# Patient Record
Sex: Male | Born: 1965
Health system: Southern US, Community
[De-identification: ages and names within clinical notes are randomized; demographics above are authoritative.]

## PROBLEM LIST (undated history)

## (undated) ENCOUNTER — Emergency Department (HOSPITAL_COMMUNITY): Admission: EM | Payer: BLUE CROSS/BLUE SHIELD | Source: Home / Self Care

## (undated) DIAGNOSIS — Z720 Tobacco use: Secondary | ICD-10-CM

## (undated) DIAGNOSIS — I10 Essential (primary) hypertension: Secondary | ICD-10-CM

## (undated) DIAGNOSIS — E785 Hyperlipidemia, unspecified: Secondary | ICD-10-CM

## (undated) DIAGNOSIS — B20 Human immunodeficiency virus [HIV] disease: Secondary | ICD-10-CM

## (undated) DIAGNOSIS — F1910 Other psychoactive substance abuse, uncomplicated: Secondary | ICD-10-CM

## (undated) DIAGNOSIS — M109 Gout, unspecified: Secondary | ICD-10-CM

## (undated) DIAGNOSIS — Z21 Asymptomatic human immunodeficiency virus [HIV] infection status: Secondary | ICD-10-CM

## (undated) HISTORY — DX: Hyperlipidemia, unspecified: E78.5

## (undated) HISTORY — PX: WRIST FRACTURE SURGERY: SHX121

## (undated) HISTORY — DX: Asymptomatic human immunodeficiency virus (hiv) infection status: Z21

## (undated) HISTORY — DX: Human immunodeficiency virus (HIV) disease: B20

## (undated) HISTORY — DX: Other psychoactive substance abuse, uncomplicated: F19.10

---

## 2002-04-19 ENCOUNTER — Emergency Department (HOSPITAL_COMMUNITY): Admission: EM | Admit: 2002-04-19 | Discharge: 2002-04-19 | Payer: Self-pay | Admitting: Emergency Medicine

## 2002-04-19 ENCOUNTER — Encounter: Payer: Self-pay | Admitting: Emergency Medicine

## 2004-11-27 ENCOUNTER — Emergency Department (HOSPITAL_COMMUNITY): Admission: EM | Admit: 2004-11-27 | Discharge: 2004-11-27 | Payer: Self-pay | Admitting: Emergency Medicine

## 2009-07-19 ENCOUNTER — Emergency Department (HOSPITAL_BASED_OUTPATIENT_CLINIC_OR_DEPARTMENT_OTHER): Admission: EM | Admit: 2009-07-19 | Discharge: 2009-07-19 | Payer: Self-pay | Admitting: Emergency Medicine

## 2011-09-04 ENCOUNTER — Emergency Department (HOSPITAL_COMMUNITY): Payer: Self-pay

## 2011-09-04 ENCOUNTER — Emergency Department (HOSPITAL_COMMUNITY)
Admission: EM | Admit: 2011-09-04 | Discharge: 2011-09-04 | Disposition: A | Discharge status: Home / Self Care | Payer: Self-pay | Attending: Emergency Medicine | Admitting: Emergency Medicine

## 2011-09-04 DIAGNOSIS — R05 Cough: Secondary | ICD-10-CM | POA: Insufficient documentation

## 2011-09-04 DIAGNOSIS — J4489 Other specified chronic obstructive pulmonary disease: Secondary | ICD-10-CM | POA: Insufficient documentation

## 2011-09-04 DIAGNOSIS — J449 Chronic obstructive pulmonary disease, unspecified: Secondary | ICD-10-CM

## 2011-09-04 DIAGNOSIS — R059 Cough, unspecified: Secondary | ICD-10-CM | POA: Insufficient documentation

## 2011-09-04 DIAGNOSIS — F17200 Nicotine dependence, unspecified, uncomplicated: Secondary | ICD-10-CM | POA: Insufficient documentation

## 2011-09-04 MED ORDER — ALBUTEROL SULFATE HFA 108 (90 BASE) MCG/ACT IN AERS
2.0000 | INHALATION_SPRAY | Freq: Once | RESPIRATORY_TRACT | Status: AC
  Administered 2011-09-04: 2 via RESPIRATORY_TRACT
  Filled 2011-09-04: qty 6.7

## 2011-09-04 NOTE — ED Notes (Signed)
Pt. Developed a cough 3 months ago. Went to [facility] and was treated with amoxicillin and chest X-ray (normal), but pt. Continue to have cough, non-productive, denies any chills , fever pain

## 2011-09-04 NOTE — ED Provider Notes (Signed)
History     CSN: [id number] Arrival date & time: 09/04/2011 10:49 AM   First MD Initiated Contact with Patient 09/04/11 1117      Chief Complaint  Patient presents with  . Cough    cough for 3 months    (Consider location/radiation/quality/duration/timing/severity/associated sxs/prior treatment) HPI Patient presents with cough which has been going on approximately 3 months. Cough is nonproductive. He states it is worse at nighttime and is causing him to lose sleep. He has no fevers or chills. No body aches or systemic symptoms. He was seen at [facility] approximately one month ago and had a chest x-ray which she states was normal. He was also given an albuterol treatment in the ED and started on amoxicillin for a two-week course. He states he had no improvement after these treatments. He does smoke cigarettes daily. He has not tried any other treatment for his symptoms. Nothing makes the symptoms better or worse. And there no other associated systemic symptoms. History reviewed. No pertinent past medical history.  Past Surgical History  Procedure Date  . Wrist surgery     History reviewed. No pertinent family history.  History  Substance Use Topics  . Smoking status: Current Everyday Smoker -- 0.5 packs/day    Types: Cigarettes  . Smokeless tobacco: Never Used  . Alcohol Use: 2.4 oz/week    4 Cans of beer per week      Review of Systems ROS reviewed and otherwise negative except for mentioned in HPI  Allergies  Review of patient's allergies indicates no known allergies.  Home Medications  No current outpatient prescriptions on file.  BP 170/72  Pulse 77  Temp(Src) 98.7 F (37.1 C) (Oral)  Resp 16  Ht 5\' 9"  (1.753 m)  Wt 180 lb (81.647 kg)  BMI 26.58 kg/m2  SpO2 98% Vitals reviewed Physical Exam Physical Examination: General appearance - alert, well appearing, and in no distress Mental status - alert, oriented to person, place, and time Mouth -  mucous membranes moist, pharynx normal without lesions Neck - supple, no significant adenopathy Chest - clear to auscultation, no wheezes, rales or rhonchi, symmetric air entry Heart - normal rate, regular rhythm, normal S1, S2, no murmurs, rubs, clicks or gallops Abdomen - soft, nontender, nondistended, no masses or organomegaly Musculoskeletal - no joint tenderness, deformity or swelling Extremities - peripheral pulses normal, no pedal edema, no clubbing or cyanosis Skin - normal coloration and turgor, no rashes  ED Course  Procedures (including critical care time)  Labs Reviewed - No data to display Dg Chest 2 View  09/04/2011  *RADIOLOGY REPORT*  Clinical Data: Cough.  CHEST - 2 VIEW  Comparison: None.  Findings: Trachea is midline.  Heart is at the upper limits of normal in size.  Lungs are mildly hyperexpanded but clear.  No pleural fluid.  IMPRESSION: COPD without acute finding.  Original Report Authenticated By: Reyes Ivan, M.D.     1. Cough   2. COPD (chronic obstructive pulmonary disease)       MDM  Patient presenting with chronic cough for several months. Chest x-ray shows signs of COPD. Patient is a daily smoker and it was recommended that he quit smoking. He is given albuterol inhaler and instructed on its use. He has no wheezing or dyspnea on exam to warrant steroid administration. He was discharged with strict return precautions and is agreeable with this plan.        Ethelda Chick, MD 09/04/11 [phone]

## 2012-07-29 ENCOUNTER — Observation Stay (HOSPITAL_COMMUNITY)
Admission: EM | Admit: 2012-07-29 | Discharge: 2012-07-30 | Disposition: A | Discharge status: Home / Self Care | Payer: 59 | Attending: Cardiovascular Disease | Admitting: Cardiovascular Disease

## 2012-07-29 ENCOUNTER — Encounter (HOSPITAL_COMMUNITY): Payer: Self-pay | Admitting: Physical Medicine and Rehabilitation

## 2012-07-29 ENCOUNTER — Emergency Department (HOSPITAL_COMMUNITY): Payer: Self-pay

## 2012-07-29 DIAGNOSIS — F17200 Nicotine dependence, unspecified, uncomplicated: Secondary | ICD-10-CM | POA: Insufficient documentation

## 2012-07-29 DIAGNOSIS — J4489 Other specified chronic obstructive pulmonary disease: Secondary | ICD-10-CM | POA: Insufficient documentation

## 2012-07-29 DIAGNOSIS — R0789 Other chest pain: Principal | ICD-10-CM

## 2012-07-29 DIAGNOSIS — I10 Essential (primary) hypertension: Secondary | ICD-10-CM

## 2012-07-29 DIAGNOSIS — Z720 Tobacco use: Secondary | ICD-10-CM

## 2012-07-29 DIAGNOSIS — R0602 Shortness of breath: Secondary | ICD-10-CM | POA: Insufficient documentation

## 2012-07-29 DIAGNOSIS — R079 Chest pain, unspecified: Secondary | ICD-10-CM

## 2012-07-29 DIAGNOSIS — J449 Chronic obstructive pulmonary disease, unspecified: Secondary | ICD-10-CM | POA: Insufficient documentation

## 2012-07-29 HISTORY — DX: Tobacco use: Z72.0

## 2012-07-29 HISTORY — DX: Essential (primary) hypertension: I10

## 2012-07-29 LAB — CBC WITH DIFFERENTIAL/PLATELET
Basophils Absolute: 0 10*3/uL (ref 0.0–0.1)
Basophils Relative: 1 % (ref 0–1)
Hemoglobin: 14.7 g/dL (ref 13.0–17.0)
MCHC: 35 g/dL (ref 30.0–36.0)
Neutro Abs: 2.1 10*3/uL (ref 1.7–7.7)
Neutrophils Relative %: 45 % (ref 43–77)
RDW: 14.4 % (ref 11.5–15.5)
WBC: 4.6 10*3/uL (ref 4.0–10.5)

## 2012-07-29 LAB — BASIC METABOLIC PANEL
Chloride: 103 mEq/L (ref 96–112)
GFR calc Af Amer: >90 mL/min (ref >90)
Potassium: 4.1 mEq/L (ref 3.5–5.1)

## 2012-07-29 LAB — CK TOTAL AND CKMB (NOT AT ARMC)
CK, MB: 4.3 ng/mL — ABNORMAL HIGH (ref 0.3–4.0)
Relative Index: 2 (ref 0.0–2.5)

## 2012-07-29 LAB — POCT I-STAT TROPONIN I: Troponin i, poc: 0.03 ng/mL (ref 0.00–0.08)

## 2012-07-29 MED ORDER — NITROGLYCERIN IN D5W 200-5 MCG/ML-% IV SOLN
5.0000 ug/min | INTRAVENOUS | Status: DC
  Filled 2012-07-29: qty 250

## 2012-07-29 MED ORDER — HEPARIN BOLUS VIA INFUSION: 4000.0000 [IU] | Freq: Once | INTRAVENOUS | Status: DC

## 2012-07-29 MED ORDER — ONDANSETRON HCL 4 MG/2ML IJ SOLN: 4.0000 mg | Freq: Four times a day (QID) | INTRAMUSCULAR | Status: DC | PRN

## 2012-07-29 MED ORDER — SODIUM CHLORIDE 0.9 % IJ SOLN
3.0000 mL | Freq: Two times a day (BID) | INTRAMUSCULAR | Status: DC
  Administered 2012-07-29: 3 mL via INTRAVENOUS

## 2012-07-29 MED ORDER — POTASSIUM CHLORIDE CRYS ER 10 MEQ PO TBCR
10.0000 meq | EXTENDED_RELEASE_TABLET | Freq: Every day | ORAL | Status: DC
  Administered 2012-07-30: 10 meq via ORAL
  Filled 2012-07-29: qty 1

## 2012-07-29 MED ORDER — NITROGLYCERIN 0.4 MG SL SUBL: 0.4000 mg | SUBLINGUAL_TABLET | SUBLINGUAL | Status: DC | PRN

## 2012-07-29 MED ORDER — AMLODIPINE BESYLATE 2.5 MG PO TABS
2.5000 mg | ORAL_TABLET | Freq: Every day | ORAL | Status: DC
  Filled 2012-07-29: qty 1

## 2012-07-29 MED ORDER — HYDROCHLOROTHIAZIDE 25 MG PO TABS
25.0000 mg | ORAL_TABLET | Freq: Every day | ORAL | Status: DC
  Administered 2012-07-30: 25 mg via ORAL
  Filled 2012-07-29: qty 1

## 2012-07-29 MED ORDER — HEPARIN (PORCINE) IN NACL 100-0.45 UNIT/ML-% IJ SOLN: 1000.0000 [IU]/h | INTRAMUSCULAR | Status: DC

## 2012-07-29 MED ORDER — NITROGLYCERIN 0.4 MG SL SUBL
0.4000 mg | SUBLINGUAL_TABLET | Freq: Once | SUBLINGUAL | Status: AC
  Administered 2012-07-29: 0.4 mg via SUBLINGUAL

## 2012-07-29 MED ORDER — ACETAMINOPHEN 325 MG PO TABS: 650.0000 mg | ORAL_TABLET | ORAL | Status: DC | PRN

## 2012-07-29 MED ORDER — SODIUM CHLORIDE 0.9 % IV SOLN: 250.0000 mL | INTRAVENOUS | Status: DC | PRN

## 2012-07-29 MED ORDER — REGADENOSON 0.4 MG/5ML IV SOLN
0.4000 mg | Freq: Once | INTRAVENOUS | Status: AC
  Administered 2012-07-30: 0.4 mg via INTRAVENOUS
  Filled 2012-07-29: qty 5

## 2012-07-29 MED ORDER — ASPIRIN EC 81 MG PO TBEC
81.0000 mg | DELAYED_RELEASE_TABLET | Freq: Every day | ORAL | Status: DC
  Administered 2012-07-30: 81 mg via ORAL
  Filled 2012-07-29: qty 1

## 2012-07-29 MED ORDER — CARVEDILOL 6.25 MG PO TABS
6.2500 mg | ORAL_TABLET | Freq: Two times a day (BID) | ORAL | Status: DC
  Administered 2012-07-29: 6.25 mg via ORAL
  Filled 2012-07-29 (×4): qty 1

## 2012-07-29 MED ORDER — DEXTROSE 5 % IV SOLN
1.0000 g | Freq: Once | INTRAVENOUS | Status: AC
  Administered 2012-07-29: 1 g via INTRAVENOUS
  Filled 2012-07-29: qty 10

## 2012-07-29 MED ORDER — DEXTROSE 5 % IV SOLN
500.0000 mg | Freq: Once | INTRAVENOUS | Status: AC
  Administered 2012-07-29: 500 mg via INTRAVENOUS
  Filled 2012-07-29: qty 500

## 2012-07-29 MED ORDER — SODIUM CHLORIDE 0.9 % IJ SOLN: 3.0000 mL | INTRAMUSCULAR | Status: DC | PRN

## 2012-07-29 NOTE — ED Notes (Signed)
Pt presents to department for evaluation of dizziness, midsternal chest pain and hypertension. Onset Tuesday. Pt states 9/10 chest pain. Respirations unlabored at the time. Skin warm and dry. He is conscious alert and oriented x4.

## 2012-07-29 NOTE — H&P (Signed)
ADMISSION HISTORY AND PHYSICAL   Date: 07/29/2012               Patient Name:  Brandon Quinn MRN: [id number]  DOB: 1966/05/27 Age / Sex: 46 y.o., male        PCP: None Primary Cardiologist: New to [place]         History of Present Illness: Patient is a 46 y.o. male with a PMHx of HTN, tobacco abuse, and COPD who was admitted to [facility] on 07/29/2012 for evaluation of  Chest tightness for the past year.  It resolved with SL NTG in the ER.  We were asked to see and evaluate.   Has not had regular medical care for many years and does not take medications. Has had chest pain daily for 2 years. Was evaluated at [facility] last year for chest pain and was told he had chronic bronchitis (no EKG was done). Worse with movement of his torso. Walks a lot at work and has chest pain with walking occasionally. No associated sob, nausea, or diaphoresis. No orthopnea. Occasional night sweats. Central chest pain without radiation. Difficultly describing character.  He presented to the ER with these pains.  The pains resolved with 1 SL NTG.  He is now pain free.  He was told that he had HTN about 4 years ago. Took some pills for a month or so but never returned to the doctor and never took any further meds.  He avoids salt in his diet.  He is originally from [place] and has been in [place] for 13 years.  Medications: Outpatient medications:  (Not in a hospital admission)  No Known Allergies   Past Medical History  Diagnosis Date  . Hypertension   . Tobacco abuse   . COPD (chronic obstructive pulmonary disease)     Past Surgical History  Procedure Date  . Wrist surgery     Family History  Problem Relation Age of Onset  . Other      No known heart disease    Social History:  reports that he has been smoking Cigarettes.  He has been smoking about .5 packs per day. He has never used smokeless tobacco. He reports that he drinks about 2.4 ounces of alcohol per week. He reports that  he does not use illicit drugs.   Review of Systems: Constitutional:  denies fever, chills, diaphoresis, appetite change and fatigue.  HEENT: denies photophobia, eye pain, redness, hearing loss, ear pain, congestion, sore throat, rhinorrhea, sneezing, neck pain, neck stiffness and tinnitus.  Respiratory: denies SOB, DOE, and wheezing. (+) cough  Cardiovascular: As per HPI   Gastrointestinal: denies nausea, vomiting, abdominal pain, diarrhea, constipation, blood in stool.  Genitourinary: denies dysuria, urgency, frequency, hematuria, flank pain and difficulty urinating.  Musculoskeletal: denies  myalgias, back pain, joint swelling, arthralgias and gait problem.   Skin: denies pallor, rash and wound.  Neurological: denies dizziness, seizures, syncope, weakness, light-headedness, numbness and headaches.   Hematological: denies adenopathy, easy bruising, personal or family bleeding history.  Psychiatric/ Behavioral: denies suicidal ideation, mood changes, confusion, nervousness, sleep disturbance and agitation.    Physical Exam: BP 176/102  Pulse 86  Temp 97.7 F (36.5 C) (Oral)  Resp 16  Ht 5\' 9"  (1.753 m)  Wt 180 lb (81.647 kg)  BMI 26.58 kg/m2  SpO2 100%  General: Vital signs reviewed and noted. Well-developed, well-nourished, in no acute distress; alert, appropriate and cooperative throughout examination.  Head: Normocephalic, atraumatic, sclera anicteric, mucus membranes  are moist  Neck: Supple. Negative for carotid bruits. JVD not elevated.  Lungs:  Clear bilaterally to auscultation without wheezes, rales, or rhonchi. Breathing is unlabored.  Heart: RR with S1 S2. Soft systolic murmur  Abdomen:  Soft, non-tender, non-distended with normoactive bowel sounds. No hepatomegaly. No rebound/guarding. No obvious abdominal masses  MSK: Strength and the appear normal for age.  Extremities: No clubbing or cyanosis. No edema.  Distal pedal pulses are 2+ and equal bilaterally.  Neurologic:  Alert and oriented X 3. Moves all extremities spontaneously  Psych:  Responds to questions appropriately with a normal affect.    Lab results: Basic Metabolic Panel:  Lab 07/29/12 4098  NA 136  K 4.1  CL 103  CO2 24  GLUCOSE 85  BUN 12  CREATININE 0.85  CALCIUM 8.9  MG --  PHOS --    Liver Function Tests: No results found for this basename: AST:3,ALT:3,ALKPHOS:3,BILITOT:3,PROT:3,ALBUMIN:3 in the last 168 hours No results found for this basename: LIPASE:3,AMYLASE:3 in the last 168 hours  CBC:  Lab 07/29/12 1835  WBC 4.6  NEUTROABS 2.1  HGB 14.7  HCT 42.0  MCV 84.5  PLT 175    Cardiac Enzymes: No results found for this basename: CKTOTAL:5,CKMB:5,CKMBINDEX:5,TROPONINI:5 in the last 168 hours  BNP: No components found with this basename: POCBNP:3  CBG: No results found for this basename: GLUCAP:5 in the last 168 hours  Coagulation Studies: No results found for this basename: LABPROT:3,INR:3 in the last 72 hours   ECG:  NSR at 84. LVH with repol changes. No old ECG Imaging: Dg Chest 2 View  07/29/2012  *RADIOLOGY REPORT*  Clinical Data: Mid chest pain and pain in the upper mid back.  CHEST - 2 VIEW  Comparison: 08/04/2011.  Findings: The heart, mediastinum and hilar contours are normal. There is a tiny patchy area of density in the right lower lung field the office of hilum.  The lungs are otherwise clear.  There are no effusions or pneumothoraces.  There are no acute bony changes.  IMPRESSION: No acute findings. There is a small, indistinct opacity at the right base.  This could be a small area of atelectasis or infiltrate. Clinical correlation and a short interval chest radiograph follow-up are recommended.   Original Report Authenticated By: Mervin Hack, M.D.     Assessment & Plan:  1. Chest tightness:  He presents to the ED with 2 years of CP.  He has been to the ED in [facility] and in [place].  The pain worsened today and he presented to our ER .   He was given a SL NTG and the pain resolved.  He is now pain free.  He has LVH with repol in ECG - no old tracings to compare.   Will admit to observation.  Cycle cardiac enzymes.  Check lipids in the am.  I anticipate doing a Lexiscan Myoview in the AM.  If his enzymes are elevated, will do cath instead.  He has a long hx of uncontrolled HTN, cigarette smoking.  No family hx of CAD that he knows.  2. Hypertension:  Will start him on HCTZ 25 QD, , kdur 1-0 QD,  and amlodipine 5 mg a day.  He may also benefit from coreg .  We will need to stick to the $4 list.  Will try to arrange for him to go the [employer].   DVT PPX -   Alvia Grove., MD, [facility] 07/29/2012, 7:57 PM

## 2012-07-29 NOTE — ED Provider Notes (Signed)
History     CSN: [id number]  Arrival date & time 07/29/12  1807   First MD Initiated Contact with Patient 07/29/12 1842      Chief Complaint  Patient presents with  . Chest Pain  . Shortness of Breath    (Consider location/radiation/quality/duration/timing/severity/associated sxs/prior treatment) Patient is a 46 y.o. male presenting with chest pain and shortness of breath. The history is provided by the patient.  Chest Pain Primary symptoms include shortness of breath and dizziness. Pertinent negatives for primary symptoms include no abdominal pain, no nausea and no vomiting.  Dizziness does not occur with nausea, vomiting or weakness.  Pertinent negatives for associated symptoms include no numbness and no weakness.    Shortness of Breath  Associated symptoms include chest pain and shortness of breath.   patient states he's had chest pain for the last couple days. He also states she's been having pain for the last 2 years. He states is more severe today and is worried about it. He states he's had a little cough without production. She's had a little dizziness. The pain comes does but has also been constant. He has hypertension and states he was on medications but is not currently on them. He does not have a good primary care followup Dr.  Past Medical History  Diagnosis Date  . Hypertension   . Tobacco abuse   . COPD (chronic obstructive pulmonary disease)     Past Surgical History  Procedure Date  . Wrist surgery     Family History  Problem Relation Age of Onset  . Other      No known heart disease    History  Substance Use Topics  . Smoking status: Current Every Day Smoker -- 0.5 packs/day    Types: Cigarettes  . Smokeless tobacco: Never Used  . Alcohol Use: 2.4 oz/week    4 Cans of beer per week      Review of Systems  Constitutional: Negative for activity change and appetite change.  HENT: Negative for neck stiffness.   Eyes: Negative for pain.    Respiratory: Positive for shortness of breath. Negative for chest tightness.   Cardiovascular: Positive for chest pain. Negative for leg swelling.  Gastrointestinal: Negative for nausea, vomiting, abdominal pain and diarrhea.  Genitourinary: Negative for flank pain.  Musculoskeletal: Negative for back pain.  Skin: Negative for rash.  Neurological: Positive for dizziness. Negative for weakness, numbness and headaches.  Psychiatric/Behavioral: Negative for behavioral problems.    Allergies  Review of patient's allergies indicates no known allergies.  Home Medications  No current outpatient prescriptions on file.  BP 153/93  Pulse 98  Temp 97.7 F (36.5 C) (Oral)  Resp 22  Ht 5\' 9"  (1.753 m)  Wt 180 lb (81.647 kg)  BMI 26.58 kg/m2  SpO2 97%  Physical Exam  Nursing note and vitals reviewed. Constitutional: He is oriented to person, place, and time. He appears well-developed and well-nourished.  HENT:  Head: Normocephalic and atraumatic.  Eyes: EOM are normal. Pupils are equal, round, and reactive to light.  Neck: Normal range of motion. Neck supple.  Cardiovascular: Normal rate, regular rhythm and normal heart sounds.   No murmur heard.      Hypertension  Pulmonary/Chest: Effort normal and breath sounds normal.  Abdominal: Soft. Bowel sounds are normal. He exhibits no distension and no mass. There is no tenderness. There is no rebound and no guarding.  Musculoskeletal: Normal range of motion. He exhibits no edema.  Neurological: He is  alert and oriented to person, place, and time. No cranial nerve deficit.  Skin: Skin is warm and dry.  Psychiatric: He has a normal mood and affect.    ED Course  Procedures (including critical care time)  Labs Reviewed  CBC WITH DIFFERENTIAL - Abnormal; Notable for the following:    Monocytes Relative 13 (*)     Eosinophils Relative 7 (*)     All other components within normal limits  BASIC METABOLIC PANEL  POCT I-STAT TROPONIN I    Dg Chest 2 View  07/29/2012  *RADIOLOGY REPORT*  Clinical Data: Mid chest pain and pain in the upper mid back.  CHEST - 2 VIEW  Comparison: 08/04/2011.  Findings: The heart, mediastinum and hilar contours are normal. There is a tiny patchy area of density in the right lower lung field the office of hilum.  The lungs are otherwise clear.  There are no effusions or pneumothoraces.  There are no acute bony changes.  IMPRESSION: No acute findings. There is a small, indistinct opacity at the right base.  This could be a small area of atelectasis or infiltrate. Clinical correlation and a short interval chest radiograph follow-up are recommended.   Original Report Authenticated By: Mervin Hack, M.D.      1. Chest pain   2. Chest tightness   3. Hypertension     Date: 07/29/2012  Rate: 84  Rhythm: normal sinus rhythm  QRS Axis: normal  Intervals: normal  ST/T Wave abnormalities: nonspecific ST/T changes  Conduction Disutrbances:LVH with repol abnormality  Narrative Interpretation: LVH with repull versus ST changes from ischemia  Old EKG Reviewed: none available     MDM  Patient with chest pain. His been going on for a couple years but worse today. Also has uncontrolled hypertension. Chest pain resolved with nitroglycerin the patient was pain-free after it. EKG shows likely repolarization from LVH. We do not have an old one to compare to. Patient be admitted to cardiology. Initial enzymes are negative.        Juliet Rude. Rubin Payor, MD 07/29/12 2055

## 2012-07-30 ENCOUNTER — Observation Stay (HOSPITAL_COMMUNITY): Payer: Self-pay

## 2012-07-30 ENCOUNTER — Encounter (HOSPITAL_COMMUNITY): Payer: Self-pay | Admitting: General Practice

## 2012-07-30 DIAGNOSIS — R079 Chest pain, unspecified: Secondary | ICD-10-CM

## 2012-07-30 DIAGNOSIS — Z720 Tobacco use: Secondary | ICD-10-CM

## 2012-07-30 LAB — BASIC METABOLIC PANEL
BUN: 12 mg/dL (ref 6–23)
CO2: 27 mEq/L (ref 19–32)
Calcium: 8.4 mg/dL (ref 8.4–10.5)
Glucose, Bld: 75 mg/dL (ref 70–99)
Sodium: 137 mEq/L (ref 135–145)

## 2012-07-30 LAB — LIPID PANEL
Cholesterol: 194 mg/dL (ref 0–200)
LDL Cholesterol: 128 mg/dL — ABNORMAL HIGH (ref 0–99)
Triglycerides: 86 mg/dL (ref <150)

## 2012-07-30 LAB — TROPONIN I: Troponin I: <0.3 ng/mL (ref <0.30)

## 2012-07-30 LAB — HEMOGLOBIN A1C
Hgb A1c MFr Bld: 5.7 % — ABNORMAL HIGH (ref <5.7)
Mean Plasma Glucose: 117 mg/dL — ABNORMAL HIGH (ref <117)

## 2012-07-30 LAB — CK TOTAL AND CKMB (NOT AT ARMC)
CK, MB: 3.3 ng/mL (ref 0.3–4.0)
Relative Index: 1.9 (ref 0.0–2.5)
Total CK: 173 U/L (ref 7–232)

## 2012-07-30 MED ORDER — PNEUMOCOCCAL VAC POLYVALENT 25 MCG/0.5ML IJ INJ
0.5000 mL | INJECTION | Freq: Once | INTRAMUSCULAR | Status: DC
  Filled 2012-07-30: qty 0.5

## 2012-07-30 MED ORDER — HYDROCHLOROTHIAZIDE 25 MG PO TABS: 25.0000 mg | ORAL_TABLET | Freq: Every day | ORAL | Status: DC

## 2012-07-30 MED ORDER — TECHNETIUM TC 99M SESTAMIBI GENERIC - CARDIOLITE
30.0000 | Freq: Once | INTRAVENOUS | Status: AC | PRN
  Administered 2012-07-30: 30 via INTRAVENOUS

## 2012-07-30 MED ORDER — INFLUENZA VIRUS VACC SPLIT PF IM SUSP
0.5000 mL | Freq: Once | INTRAMUSCULAR | Status: DC
Start: 2012-07-30 — End: 2012-07-30
  Filled 2012-07-30: qty 0.5

## 2012-07-30 MED ORDER — TECHNETIUM TC 99M SESTAMIBI GENERIC - CARDIOLITE
10.0000 | Freq: Once | INTRAVENOUS | Status: AC | PRN
  Administered 2012-07-30: 10 via INTRAVENOUS

## 2012-07-30 MED ORDER — AMLODIPINE BESYLATE 5 MG PO TABS: 5.0000 mg | ORAL_TABLET | Freq: Every day | ORAL | Status: DC

## 2012-07-30 MED ORDER — CARVEDILOL 6.25 MG PO TABS: 6.2500 mg | ORAL_TABLET | Freq: Two times a day (BID) | ORAL | Status: DC

## 2012-07-30 MED ORDER — REGADENOSON 0.4 MG/5ML IV SOLN
INTRAVENOUS | Status: AC
  Administered 2012-07-30: 0.4 mg via INTRAVENOUS
  Filled 2012-07-30: qty 5

## 2012-07-30 MED ORDER — POTASSIUM CHLORIDE CRYS ER 10 MEQ PO TBCR: 10.0000 meq | EXTENDED_RELEASE_TABLET | Freq: Every day | ORAL | Status: DC

## 2012-07-30 MED ORDER — AMLODIPINE BESYLATE 5 MG PO TABS
5.0000 mg | ORAL_TABLET | Freq: Every day | ORAL | Status: DC
  Administered 2012-07-30: 5 mg via ORAL
  Filled 2012-07-30: qty 1

## 2012-07-30 NOTE — Discharge Summary (Signed)
Patient ID: Brandon Quinn,  MRN: [id number], DOB/AGE: Feb 24, 1966 46 y.o.  Admit date: 07/29/2012 Discharge date: 07/30/2012  Primary Care Provider: None Primary Cardiologist: Katherina Right, MD  Discharge Diagnoses Principal Problem:  *Chest tightness  **s/p normal Lexiscan Sestamibi this admission. Active Problems:  Hypertension  Tobacco abuse  Tobacco Abuse  Allergies No Known Allergies  Procedures  07/30/2012 Lexiscan Sestamibi  No ischemia/infarct.  Normal EF.  History of Present Illness  46 y/o male with prior h/o untreated hypertension and tobacco abuse along with a long history of intermittent chest pain.  He presented to the [facility] ED secondary to recurrent chest pain while walking at work.  In the ED, symptoms resolved following 1 sublingual nitroglycerin.  ECG showed LVH with repolarization abnormalities.  He was placed in observation for further evaluation.  Hospital Course  Following admission, pt had no further chest pain.  His cardiac enzymes remained negative.  He underwent lexiscan sestamibi this AM revealing no evidence of infarct or ischemia.  He has been hypertensive throughout his admission and we have initiated antihypertensive therapy including bb, diuretic, and ccb.  We have arranged for office f/u next week, at which point we will also assess a basic metabolic profile.  We have encouraged him to quit smoking and also to obtain a primary care provider.  He will be discharged home today in good condition.  Discharge Vitals Blood pressure 158/76, pulse 63, temperature 98.2 F (36.8 C), temperature source Oral, resp. rate 20, height 5\' 9"  (1.753 m), weight 179 lb 10.8 oz (81.5 kg), SpO2 100.00%.  Filed Weights   07/29/12 1812 07/29/12 2128  Weight: 180 lb (81.647 kg) 179 lb 10.8 oz (81.5 kg)   Labs  CBC  Basename 07/29/12 1835  WBC 4.6  NEUTROABS 2.1  HGB 14.7  HCT 42.0  MCV 84.5  PLT 175   Basic Metabolic Panel  Basename 07/30/12 0259 07/29/12 1835    NA 137 136  K 3.7 4.1  CL 104 103  CO2 27 24  GLUCOSE 75 85  BUN 12 12  CREATININE 0.85 0.85  CALCIUM 8.4 8.9  MG -- --  PHOS -- --   Cardiac Enzymes  Basename 07/30/12 0259 07/29/12 2130  CKTOTAL 173 220  CKMB 3.3 4.3*  CKMBINDEX -- --  TROPONINI <0.30 <0.30   Hemoglobin A1C  Basename 07/29/12 2131  HGBA1C 5.7*   Fasting Lipid Panel  Basename 07/30/12 0259  CHOL 194  HDL 49  LDLCALC 128*  TRIG 86  CHOLHDL 4.0  LDLDIRECT --   Thyroid Function Tests  Basename 07/29/12 2131  TSH 0.715  T4TOTAL --  T3FREE --  THYROIDAB --   Disposition  Pt is being discharged home today in good condition.  Follow-up Plans & Appointments      Follow-up Information    Follow up with Norma Fredrickson, NP. On 08/06/2012. (9:15 AM - for visit with Dr. Harvie Bridge Nurse Practitioner and a blood chemistry)    Contact information:   [address] [place] [address] [phone]       Follow up with Obtain a primary care provider locally. In 2 weeks.       Discharge Medications    Medication List     As of 07/30/2012  3:23 PM    TAKE these medications         amLODipine 5 MG tablet   Commonly known as: NORVASC   Take 1 tablet (5 mg total) by mouth daily.  carvedilol 6.25 MG tablet   Commonly known as: COREG   Take 1 tablet (6.25 mg total) by mouth 2 (two) times daily with a meal.      hydrochlorothiazide 25 MG tablet   Commonly known as: HYDRODIURIL   Take 1 tablet (25 mg total) by mouth daily.      potassium chloride 10 MEQ tablet   Commonly known as: K-DUR,KLOR-CON   Take 1 tablet (10 mEq total) by mouth daily.       Outstanding Labs/Studies  *Bmet next week.  Duration of Discharge Encounter   Greater than 30 minutes including physician time.  Signed, Nicolasa Ducking NP 07/30/2012, 3:23 PM   Attending note: I have seen and examined patient and made appropriate changes to the above.  He has severe , uncontrolled HTN.  I  suspect this is the cause of his ECG changes and also possibly his CP.  Myoview was negative.  Home on the above meds.  We will see him in several weeks.  Vesta Mixer, Montez Hageman., MD, [facility]

## 2012-07-30 NOTE — Progress Notes (Signed)
Patient Name: Brandon Quinn Date of Encounter: 07/30/2012   Active Problems:  Chest tightness  Hypertension   SUBJECTIVE  No chest pain or sob overnight.  For mv today.  CURRENT MEDS    . amLODipine  2.5 mg Oral Daily  . aspirin EC  81 mg Oral Daily  . azithromycin  500 mg Intravenous Once  . carvedilol  6.25 mg Oral BID WC  . cefTRIAXone (ROCEPHIN)  IV  1 g Intravenous Once  . hydrochlorothiazide  25 mg Oral Daily  . influenza  inactive virus vaccine  0.5 mL Intramuscular Once  . nitroGLYCERIN  0.4 mg Sublingual Once  . pneumococcal 23 valent vaccine  0.5 mL Intramuscular Once  . potassium chloride  10 mEq Oral Daily  . regadenoson  0.4 mg Intravenous Once  . sodium chloride  3 mL Intravenous Q12H  . DISCONTD: heparin  4,000 Units Intravenous Once   OBJECTIVE  Filed Vitals:   07/29/12 2030 07/29/12 2128 07/30/12 0540 07/30/12 0916  BP: 153/93 162/81 149/68 157/88  Pulse: 98 81  60  Temp:  98.1 F (36.7 C) 98 F (36.7 C)   TempSrc:  Oral Oral   Resp: 22 18 18    Height:  5\' 9"  (1.753 m)    Weight:  179 lb 10.8 oz (81.5 kg)    SpO2: 97% 100% 100%    No intake or output data in the 24 hours ending 07/30/12 0929 Filed Weights   07/29/12 1812 07/29/12 2128  Weight: 180 lb (81.647 kg) 179 lb 10.8 oz (81.5 kg)    PHYSICAL EXAM General: Pleasant, NAD. Neuro: Alert and oriented X 3. Moves all extremities spontaneously. Psych: Normal affect. HEENT:  Normal  Neck: Supple without bruits or JVD. Lungs:  Resp regular and unlabored, CTA. Heart: RRR no s3, s4, or murmurs. Abdomen: Soft, non-tender, non-distended, BS + x 4.  Extremities: No clubbing, cyanosis or edema. DP/PT/Radials 2+ and equal bilaterally.  Accessory Clinical Findings  CBC  Basename 07/29/12 1835  WBC 4.6  NEUTROABS 2.1  HGB 14.7  HCT 42.0  MCV 84.5  PLT 175   Basic Metabolic Panel  Basename 07/30/12 0259 07/29/12 1835  NA 137 136  K 3.7 4.1  CL 104 103  CO2 27 24  GLUCOSE 75 85    BUN 12 12  CREATININE 0.85 0.85  CALCIUM 8.4 8.9  MG -- --  PHOS -- --   Cardiac Enzymes  Basename 07/30/12 0259 07/29/12 2130  CKTOTAL 173 220  CKMB 3.3 4.3*  CKMBINDEX -- --  TROPONINI <0.30 <0.30   Hemoglobin A1C  Basename 07/29/12 2131  HGBA1C 5.7*   Fasting Lipid Panel  Basename 07/30/12 0259  CHOL 194  HDL 49  LDLCALC 128*  TRIG 86  CHOLHDL 4.0  LDLDIRECT --   Thyroid Function Tests  Basename 07/29/12 2131  TSH 0.715  T4TOTAL --  T3FREE --  THYROIDAB --    TELE  Seen in Nuc Med  ECG  Rsr, 64, lvh, inflat st dep/twi - unchanged.  Radiology/Studies  Dg Chest 2 View  07/29/2012  *RADIOLOGY REPORT*  Clinical Data: Mid chest pain and pain in the upper mid back.  CHEST - 2 VIEW  Comparison: 08/04/2011.  Findings: The heart, mediastinum and hilar contours are normal. There is a tiny patchy area of density in the right lower lung field the office of hilum.  The lungs are otherwise clear.  There are no effusions or pneumothoraces.  There are no acute bony changes.  IMPRESSION: No acute findings. There is a small, indistinct opacity at the right base.  This could be a small area of atelectasis or infiltrate. Clinical correlation and a short interval chest radiograph follow-up are recommended.   Original Report Authenticated By: Mervin Hack, M.D.     ASSESSMENT AND PLAN  1.  Chest Tightness:  Resolved.  CE negative.  Abnl ecg.  For Lexiscan mv today.  D/C this afternoon if normal.  2.  HTN: bp elevated overnight.  Titrate amlodipine to 5mg  daily.  Will require close outpt f/u.  3.  Tob Abuse:  Cessation advised.  Signed, Nicolasa Ducking NP

## 2012-08-05 ENCOUNTER — Other Ambulatory Visit: Payer: Self-pay | Admitting: *Deleted

## 2012-08-05 DIAGNOSIS — I10 Essential (primary) hypertension: Secondary | ICD-10-CM

## 2012-08-06 ENCOUNTER — Other Ambulatory Visit: Payer: Self-pay

## 2012-08-06 ENCOUNTER — Ambulatory Visit: Payer: Self-pay | Admitting: Nurse Practitioner

## 2012-10-12 ENCOUNTER — Emergency Department (HOSPITAL_COMMUNITY)
Admission: EM | Admit: 2012-10-12 | Discharge: 2012-10-12 | Disposition: A | Discharge status: Home / Self Care | Payer: Self-pay | Attending: Emergency Medicine | Admitting: Emergency Medicine

## 2012-10-12 ENCOUNTER — Encounter (HOSPITAL_COMMUNITY): Payer: Self-pay | Admitting: Cardiology

## 2012-10-12 DIAGNOSIS — I10 Essential (primary) hypertension: Secondary | ICD-10-CM | POA: Insufficient documentation

## 2012-10-12 DIAGNOSIS — F17200 Nicotine dependence, unspecified, uncomplicated: Secondary | ICD-10-CM | POA: Insufficient documentation

## 2012-10-12 MED ORDER — CARVEDILOL 6.25 MG PO TABS: 6.2500 mg | ORAL_TABLET | Freq: Two times a day (BID) | ORAL | Status: DC

## 2012-10-12 MED ORDER — HYDROCHLOROTHIAZIDE 12.5 MG PO TABS: 25.0000 mg | ORAL_TABLET | Freq: Every day | ORAL | Status: DC

## 2012-10-12 NOTE — ED Notes (Signed)
Pt reports he woke up this morning and states he felt like his BP was high. Reports he is suppose to take BP medication but lost his Rx. Denies any headache, or changes in his vision.

## 2012-10-12 NOTE — ED Provider Notes (Signed)
History   This chart was scribed for Brandon Kaplan, MD by Gerlean Ren, ED Scribe. This patient was seen in room TR05C/TR05C and the patient's care was started at 7:02 PM    CSN: [id number]  Arrival date & time 10/12/12  1528   None     Chief Complaint  Patient presents with  . Hypertension     The history is provided by the patient. No language interpreter was used.   Javad Salva is a 47 y.o. male with a h/o HTN who presents to the Emergency Department because he lost his unspecified BP medication prescription.  Pt denies HA, chest pain, dyspnea, flank pain, and has no physical complaints at this time.  Pt has no known allergies.  Pt is a current everyday smoker and reports alcohol use.  No PCP. Past Medical History  Diagnosis Date  . Hypertension   . Chest pain     a. 07/2012 Lexiscan Sestamibi:  no ischemia/infarct, nl EF.  . Tobacco abuse     Past Surgical History  Procedure Date  . Wrist fracture surgery ~ 2009    right; "put a plate in it" (14/78/2956)    Family History  Problem Relation Age of Onset  . Other      No known heart disease    History  Substance Use Topics  . Smoking status: Current Every Day Smoker -- 0.5 packs/day for 26 years    Types: Cigarettes  . Smokeless tobacco: Never Used  . Alcohol Use: 7.2 oz/week    12 Cans of beer per week     Comment: 07/30/2012 "1-2 beers/day; maybe 12/wk"      Review of Systems  Respiratory: Negative for shortness of breath.   Cardiovascular: Negative for chest pain and leg swelling.  Neurological: Negative for headaches.    Allergies  Review of patient's allergies indicates no known allergies.  Home Medications  No current outpatient prescriptions on file.  BP 182/75  Pulse 95  Temp 98 F (36.7 C) (Oral)  Resp 20  SpO2 100%  Physical Exam  Nursing note and vitals reviewed. Constitutional: He is oriented to person, place, and time. He appears well-developed and well-nourished. No distress.    HENT:  Head: Normocephalic and atraumatic.  Eyes: Conjunctivae normal are normal.  Neck: Neck supple. No JVD present. No tracheal deviation present.  Cardiovascular: Normal rate, regular rhythm and normal heart sounds.   No murmur heard. Pulmonary/Chest: Effort normal and breath sounds normal. No respiratory distress. He has no wheezes. He has no rales.  Musculoskeletal: Normal range of motion. He exhibits no edema.  Neurological: He is alert and oriented to person, place, and time.  Skin: Skin is warm and dry.  Psychiatric: He has a normal mood and affect. His behavior is normal.    ED Course  Procedures (including critical care time) DIAGNOSTIC STUDIES: Oxygen Saturation is 100% on room air, normal by my interpretation.    COORDINATION OF CARE: 7:06 PM- Informed pt that finding a PCP is necessary in monitoring chronic HTN.  Discussed discharge with list of PCP options.  Pt requested work note for today and tomorrow, I informed pt that I can only write work note for today.      No diagnosis found.    MDM  I personally performed the services described in this documentation, which was scribed in my presence. The recorded information has been reviewed and is accurate.  Pt comes in with cc of HTN. He has run out  of his pills - and he is not sure what they are. Our records indicate that he was on 3 separate BP pills. He has no sx of end organ damage, and his exam is not indicative of the same. Will give him a dose of BP meds here, and discharge.        Brandon Kaplan, MD 10/12/12 [phone]

## 2012-10-12 NOTE — ED Notes (Signed)
The pt has been out of his bp meds for approx 3 weeks and hes here today for high bp

## 2012-11-30 ENCOUNTER — Emergency Department (HOSPITAL_COMMUNITY)
Admission: EM | Admit: 2012-11-30 | Discharge: 2012-12-01 | Disposition: A | Discharge status: Home / Self Care | Payer: BC Managed Care – PPO | Attending: Emergency Medicine | Admitting: Emergency Medicine

## 2012-11-30 DIAGNOSIS — Y929 Unspecified place or not applicable: Secondary | ICD-10-CM | POA: Insufficient documentation

## 2012-11-30 DIAGNOSIS — Z79899 Other long term (current) drug therapy: Secondary | ICD-10-CM | POA: Insufficient documentation

## 2012-11-30 DIAGNOSIS — S5010XA Contusion of unspecified forearm, initial encounter: Secondary | ICD-10-CM | POA: Insufficient documentation

## 2012-11-30 DIAGNOSIS — W19XXXA Unspecified fall, initial encounter: Secondary | ICD-10-CM | POA: Insufficient documentation

## 2012-11-30 DIAGNOSIS — S5012XA Contusion of left forearm, initial encounter: Secondary | ICD-10-CM

## 2012-11-30 DIAGNOSIS — I10 Essential (primary) hypertension: Secondary | ICD-10-CM | POA: Insufficient documentation

## 2012-11-30 DIAGNOSIS — Z8679 Personal history of other diseases of the circulatory system: Secondary | ICD-10-CM | POA: Insufficient documentation

## 2012-11-30 DIAGNOSIS — Y939 Activity, unspecified: Secondary | ICD-10-CM | POA: Insufficient documentation

## 2012-11-30 DIAGNOSIS — F17200 Nicotine dependence, unspecified, uncomplicated: Secondary | ICD-10-CM | POA: Insufficient documentation

## 2012-11-30 MED ORDER — OXYCODONE-ACETAMINOPHEN 5-325 MG PO TABS
1.0000 | ORAL_TABLET | Freq: Once | ORAL | Status: AC
  Administered 2012-11-30: 1 via ORAL
  Filled 2012-11-30: qty 1

## 2012-11-30 NOTE — ED Notes (Signed)
Pt states he fell last night, c/o arm pain.  Small raised area to the left forearm, states swelling has gone down

## 2012-11-30 NOTE — ED Provider Notes (Signed)
History    This chart was scribed for Lyanne Co, MD by Gerlean Ren, ED Scribe. This patient was seen in room TR09C/TR09C and the patient's care was started at 11:36 PM    CSN: [id number]  Arrival date & time 11/30/12  2225   First MD Initiated Contact with Patient 11/30/12 2257      Chief Complaint  Patient presents with  . Arm Pain     The history is provided by the patient. No language interpreter was used.  Brandon Quinn is a 47 y.o. male who presents to the Emergency Department complaining of constant left forearm pain rated as 8/10 with sudden onset after falling and landing with his full body weight on the arm last night.  Pt also reports swelling that has improved since the fall.  Past Medical History  Diagnosis Date  . Hypertension   . Chest pain     a. 07/2012 Lexiscan Sestamibi:  no ischemia/infarct, nl EF.  . Tobacco abuse     Past Surgical History  Procedure Laterality Date  . Wrist fracture surgery  ~ 2009    right; "put a plate in it" (78/29/5621)    Family History  Problem Relation Age of Onset  . Other      No known heart disease    History  Substance Use Topics  . Smoking status: Current Every Day Smoker -- 0.50 packs/day for 26 years    Types: Cigarettes  . Smokeless tobacco: Never Used  . Alcohol Use: 7.2 oz/week    12 Cans of beer per week     Comment: 07/30/2012 "1-2 beers/day; maybe 12/wk"      Review of Systems A complete 10 system review of systems was obtained and all systems are negative except as noted in the HPI and PMH.   Allergies  Review of patient's allergies indicates no known allergies.  Home Medications   Current Outpatient Rx  Name  Route  Sig  Dispense  Refill  . carvedilol (COREG) 6.25 MG tablet   Oral   Take 1 tablet (6.25 mg total) by mouth 2 (two) times daily with a meal.   60 tablet   0   . hydrochlorothiazide (HYDRODIURIL) 12.5 MG tablet   Oral   Take 2 tablets (25 mg total) by mouth daily.   30  tablet   0     BP 188/99  Pulse 80  Temp(Src) 98.1 F (36.7 C) (Oral)  Resp 18  SpO2 99%  Physical Exam  Nursing note and vitals reviewed. Constitutional: He is oriented to person, place, and time. He appears well-developed and well-nourished.  HENT:  Head: Normocephalic.  Eyes: EOM are normal.  Neck: Normal range of motion.  Cardiovascular:  Normal left radial pulse  Pulmonary/Chest: Effort normal.  Abdominal: He exhibits no distension.  Musculoskeletal: Normal range of motion.  Tenderness and swelling over distal left radius, no erythema  Neurological: He is alert and oriented to person, place, and time.  Psychiatric: He has a normal mood and affect.    ED Course  Procedures (including critical care time) DIAGNOSTIC STUDIES: Oxygen Saturation is 99% on room air, normal by my interpretation.    COORDINATION OF CARE: 11:36 PM- Patient informed of clinical course including left forearm XR, understands medical decision-making process, and agrees with plan.   Dg Forearm Left  12/01/2012  *RADIOLOGY REPORT*  Clinical Data: Pain and swelling in the left wrist after fall yesterday.  LEFT FOREARM - 2 VIEW  Comparison:  None.  Findings: The left radius and ulna appear intact. No evidence of acute fracture or subluxation.  No focal bone lesions.  Bone matrix and cortex appear intact.  No abnormal radiopaque densities in the soft tissues.  IMPRESSION: No acute bony abnormalities.   Original Report Authenticated By: Burman Nieves, M.D.    I personally reviewed the imaging tests through PACS system I reviewed available ER/hospitalization records through the EMR    1. Traumatic hematoma of forearm, left, initial encounter       MDM  Plain xray without fracture. Likely bone bruise with hematoma   I personally performed the services described in this documentation, which was scribed in my presence. The recorded information has been reviewed and is accurate.            Lyanne Co, MD 12/01/12 [phone]

## 2012-12-01 ENCOUNTER — Emergency Department (HOSPITAL_COMMUNITY): Payer: BC Managed Care – PPO

## 2012-12-01 MED ORDER — HYDROCODONE-ACETAMINOPHEN 5-325 MG PO TABS: 1.0000 | ORAL_TABLET | ORAL | Status: DC | PRN

## 2012-12-14 ENCOUNTER — Emergency Department (HOSPITAL_COMMUNITY)
Admission: EM | Admit: 2012-12-14 | Discharge: 2012-12-15 | Disposition: A | Discharge status: Home / Self Care | Payer: BC Managed Care – PPO | Attending: Emergency Medicine | Admitting: Emergency Medicine

## 2012-12-14 ENCOUNTER — Encounter (HOSPITAL_COMMUNITY): Payer: Self-pay | Admitting: Adult Health

## 2012-12-14 ENCOUNTER — Emergency Department (HOSPITAL_COMMUNITY): Payer: BC Managed Care – PPO

## 2012-12-14 DIAGNOSIS — J40 Bronchitis, not specified as acute or chronic: Secondary | ICD-10-CM

## 2012-12-14 DIAGNOSIS — J029 Acute pharyngitis, unspecified: Secondary | ICD-10-CM | POA: Insufficient documentation

## 2012-12-14 DIAGNOSIS — J209 Acute bronchitis, unspecified: Secondary | ICD-10-CM | POA: Insufficient documentation

## 2012-12-14 DIAGNOSIS — I10 Essential (primary) hypertension: Secondary | ICD-10-CM | POA: Insufficient documentation

## 2012-12-14 DIAGNOSIS — F17200 Nicotine dependence, unspecified, uncomplicated: Secondary | ICD-10-CM | POA: Insufficient documentation

## 2012-12-14 DIAGNOSIS — J329 Chronic sinusitis, unspecified: Secondary | ICD-10-CM

## 2012-12-14 DIAGNOSIS — R079 Chest pain, unspecified: Secondary | ICD-10-CM

## 2012-12-14 DIAGNOSIS — J3489 Other specified disorders of nose and nasal sinuses: Secondary | ICD-10-CM | POA: Insufficient documentation

## 2012-12-14 DIAGNOSIS — R059 Cough, unspecified: Secondary | ICD-10-CM | POA: Insufficient documentation

## 2012-12-14 DIAGNOSIS — Z79899 Other long term (current) drug therapy: Secondary | ICD-10-CM | POA: Insufficient documentation

## 2012-12-14 DIAGNOSIS — J019 Acute sinusitis, unspecified: Secondary | ICD-10-CM | POA: Insufficient documentation

## 2012-12-14 NOTE — ED Provider Notes (Signed)
History     CSN: [id number]  Arrival date & time 12/14/12  2200   First MD Initiated Contact with Patient 12/14/12 2244      Chief Complaint  Patient presents with  . Chest Pain    (Consider location/radiation/quality/duration/timing/severity/associated sxs/prior treatment) HPI Comments: 47 y/o male presents to the ED complaining of chest pain x 2 days. Describes the pain as feeling "congested" in the center of his chest, non radiating rated 9/10. He has not tried any alleviating factors. Pain worse with deep inspiration. Admits to associated productive cough with green phlegm turning bloody at one point earlier today. Also complaining of head congestion x 2 days with associated blood tinged rhinorrhea today. States his power went out 2 days ago and left the window open breathing in the cold prior to his symptoms starting.  Patient is a 47 y.o. male presenting with chest pain. The history is provided by the patient.  Chest Pain Associated symptoms: cough   Associated symptoms: no back pain, no fever, no nausea, no shortness of breath and not vomiting     Past Medical History  Diagnosis Date  . Hypertension   . Chest pain     a. 07/2012 Lexiscan Sestamibi:  no ischemia/infarct, nl EF.  . Tobacco abuse     Past Surgical History  Procedure Laterality Date  . Wrist fracture surgery  ~ 2009    right; "put a plate in it" (78/29/5621)    Family History  Problem Relation Age of Onset  . Other      No known heart disease    History  Substance Use Topics  . Smoking status: Current Every Day Smoker -- 0.50 packs/day for 26 years    Types: Cigarettes  . Smokeless tobacco: Never Used  . Alcohol Use: 7.2 oz/week    12 Cans of beer per week     Comment: 07/30/2012 "1-2 beers/day; maybe 12/wk"      Review of Systems  Constitutional: Negative for fever, chills, activity change and appetite change.  HENT: Positive for congestion, sore throat and sinus pressure.   Respiratory:  Positive for cough. Negative for shortness of breath.   Cardiovascular: Positive for chest pain.  Gastrointestinal: Negative for nausea and vomiting.  Musculoskeletal: Negative for back pain.  Psychiatric/Behavioral: Negative for confusion.  All other systems reviewed and are negative.    Allergies  Review of patient's allergies indicates no known allergies.  Home Medications   Current Outpatient Rx  Name  Route  Sig  Dispense  Refill  . carvedilol (COREG) 6.25 MG tablet   Oral   Take 1 tablet (6.25 mg total) by mouth 2 (two) times daily with a meal.   60 tablet   0   . hydrochlorothiazide (HYDRODIURIL) 12.5 MG tablet   Oral   Take 2 tablets (25 mg total) by mouth daily.   30 tablet   0     BP 207/114  Pulse 92  Temp(Src) 98.7 F (37.1 C) (Oral)  Ht 6\' 2"  (1.88 m)  Wt 210 lb (95.255 kg)  BMI 26.95 kg/m2  SpO2 98%  Physical Exam  Nursing note and vitals reviewed. Constitutional: He is oriented to person, place, and time. He appears well-developed and well-nourished. No distress.  HENT:  Head: Normocephalic and atraumatic.  Nose: Mucosal edema and rhinorrhea (mucoid) present. No epistaxis. Right sinus exhibits maxillary sinus tenderness. Right sinus exhibits no frontal sinus tenderness. Left sinus exhibits maxillary sinus tenderness. Left sinus exhibits no frontal sinus tenderness.  Mouth/Throat: Uvula is midline and mucous membranes are normal. Posterior oropharyngeal edema and posterior oropharyngeal erythema present. No oropharyngeal exudate.  Post nasal drip present.  Eyes: Conjunctivae and EOM are normal. Pupils are equal, round, and reactive to light.  Neck: Normal range of motion. Neck supple.  Cardiovascular: Normal rate, regular rhythm, normal heart sounds and intact distal pulses.   Pulmonary/Chest: Effort normal and breath sounds normal. No respiratory distress. He has no decreased breath sounds. He has no wheezes. He has no rhonchi. He has no rales. He  exhibits no tenderness.  Abdominal: Soft. Bowel sounds are normal. There is no tenderness.  Musculoskeletal: Normal range of motion. He exhibits no edema.  Lymphadenopathy:    He has cervical adenopathy.  Neurological: He is alert and oriented to person, place, and time.  Skin: Skin is warm and dry. He is not diaphoretic.  Psychiatric: He has a normal mood and affect. His behavior is normal.    ED Course  Procedures (including critical care time)  Labs Reviewed  CBC  BASIC METABOLIC PANEL  POCT I-STAT TROPONIN I    Date: 12/14/2012  Rate: 90  Rhythm: normal sinus rhythm  QRS Axis: normal  Intervals: normal  ST/T Wave abnormalities: nonspecific ST changes  Conduction Disutrbances:none  Narrative Interpretation: NSR, LVH with repolarization abnormality, possible septal infarct, age undetermined, no sig changes since prior EKG from Jul 29, 2012  Old EKG Reviewed: unchanged   Dg Chest 2 View  12/14/2012  *RADIOLOGY REPORT*  Clinical Data: Chest pain, hypertension, smoker  CHEST - 2 VIEW  Comparison: 07/29/2012  Findings: Borderline enlargement of cardiac silhouette. Tortuous aorta. Pulmonary vascularity normal. Mild bronchitic and emphysematous changes. No acute infiltrate, pleural effusion, or pneumothorax. Question left nipple shadow. No acute osseous findings.  IMPRESSION: Emphysematous and bronchitic changes question COPD. Question left nipple shadow; repeat PA chest radiograph with nipple markers recommended to exclude pulmonary nodule.   Original Report Authenticated By: Ulyses Southward, M.D.      1. Bronchitis   2. Sinusitis   3. Chest pain       MDM  47 y/o male with bronchitis/sinusitis. Marked mucosal edema present on exam along with mucoid rhinorrhea and PND. No epistaxis. Lung examination normal. Labs unremarkable. CXR with questionable left nipple shadow. I discussed this with patient and resource guide given for him to f/u with PCP on this issue. No concern for cardiac  origin of chest pain. PERC negative. Rx augmentin for sinusitis/bronchitis, Flonase for nasal congestion. Conservative measures discussed.  Albuterol inhaler given. Return precautions discussed. Patient states understanding of plan and is agreeable.         Trevor Mace, PA-C 12/15/12 [phone]

## 2012-12-14 NOTE — ED Notes (Signed)
Pt arrived to room and went straight to xray

## 2012-12-14 NOTE — ED Notes (Signed)
Pt refusing lab work

## 2012-12-14 NOTE — ED Notes (Addendum)
Presents with sternal chest pain, onset last night associated with pain with deep inspiration. Denies nausea, denies SOB. Pt is refusing lab work. He is alert and oriented. Hypertensive 200/100s. Denies radiation.  Reports coughing up blood.

## 2012-12-15 LAB — BASIC METABOLIC PANEL
BUN: 6 mg/dL (ref 6–23)
CO2: 27 mEq/L (ref 19–32)
Calcium: 8.9 mg/dL (ref 8.4–10.5)
Chloride: 101 mEq/L (ref 96–112)
Creatinine, Ser: 0.74 mg/dL (ref 0.50–1.35)
Glucose, Bld: 80 mg/dL (ref 70–99)

## 2012-12-15 LAB — CBC
HCT: 42.4 % (ref 39.0–52.0)
Hemoglobin: 15 g/dL (ref 13.0–17.0)
MCH: 29.9 pg (ref 26.0–34.0)
MCV: 84.5 fL (ref 78.0–100.0)
Platelets: 190 10*3/uL (ref 150–400)
RBC: 5.02 MIL/uL (ref 4.22–5.81)
WBC: 4.7 10*3/uL (ref 4.0–10.5)

## 2012-12-15 MED ORDER — AMOXICILLIN-POT CLAVULANATE 875-125 MG PO TABS
1.0000 | ORAL_TABLET | Freq: Once | ORAL | Status: AC
  Administered 2012-12-15: 1 via ORAL
  Filled 2012-12-15: qty 1

## 2012-12-15 MED ORDER — FLUTICASONE PROPIONATE 50 MCG/ACT NA SUSP: 2.0000 | Freq: Every day | NASAL | Status: DC

## 2012-12-15 MED ORDER — ALBUTEROL SULFATE HFA 108 (90 BASE) MCG/ACT IN AERS
2.0000 | INHALATION_SPRAY | Freq: Once | RESPIRATORY_TRACT | Status: AC
  Administered 2012-12-15: 2 via RESPIRATORY_TRACT
  Filled 2012-12-15: qty 6.7

## 2012-12-15 MED ORDER — AMOXICILLIN-POT CLAVULANATE 875-125 MG PO TABS: 1.0000 | ORAL_TABLET | Freq: Two times a day (BID) | ORAL | Status: DC

## 2012-12-15 NOTE — ED Provider Notes (Signed)
Medical screening examination/treatment/procedure(s) were performed by non-physician practitioner and as supervising physician I was immediately available for consultation/collaboration.   Richardean Canal, MD 12/15/12 [phone]

## 2012-12-22 ENCOUNTER — Emergency Department (HOSPITAL_COMMUNITY)
Admission: EM | Admit: 2012-12-22 | Discharge: 2012-12-22 | Disposition: A | Discharge status: Home / Self Care | Payer: BC Managed Care – PPO | Attending: Emergency Medicine | Admitting: Emergency Medicine

## 2012-12-22 ENCOUNTER — Emergency Department (HOSPITAL_COMMUNITY): Payer: BC Managed Care – PPO

## 2012-12-22 ENCOUNTER — Encounter (HOSPITAL_COMMUNITY): Payer: Self-pay | Admitting: Cardiology

## 2012-12-22 DIAGNOSIS — Y9269 Other specified industrial and construction area as the place of occurrence of the external cause: Secondary | ICD-10-CM | POA: Insufficient documentation

## 2012-12-22 DIAGNOSIS — Y9389 Activity, other specified: Secondary | ICD-10-CM | POA: Insufficient documentation

## 2012-12-22 DIAGNOSIS — S63509A Unspecified sprain of unspecified wrist, initial encounter: Secondary | ICD-10-CM | POA: Insufficient documentation

## 2012-12-22 DIAGNOSIS — F17200 Nicotine dependence, unspecified, uncomplicated: Secondary | ICD-10-CM | POA: Insufficient documentation

## 2012-12-22 DIAGNOSIS — Z79899 Other long term (current) drug therapy: Secondary | ICD-10-CM | POA: Insufficient documentation

## 2012-12-22 DIAGNOSIS — S63501A Unspecified sprain of right wrist, initial encounter: Secondary | ICD-10-CM

## 2012-12-22 DIAGNOSIS — X500XXA Overexertion from strenuous movement or load, initial encounter: Secondary | ICD-10-CM | POA: Insufficient documentation

## 2012-12-22 DIAGNOSIS — Y990 Civilian activity done for income or pay: Secondary | ICD-10-CM | POA: Insufficient documentation

## 2012-12-22 DIAGNOSIS — I10 Essential (primary) hypertension: Secondary | ICD-10-CM | POA: Insufficient documentation

## 2012-12-22 DIAGNOSIS — Z8781 Personal history of (healed) traumatic fracture: Secondary | ICD-10-CM | POA: Insufficient documentation

## 2012-12-22 MED ORDER — TRAMADOL HCL 50 MG PO TABS: 50.0000 mg | ORAL_TABLET | Freq: Four times a day (QID) | ORAL | Status: DC | PRN

## 2012-12-22 MED ORDER — TRAMADOL HCL 50 MG PO TABS
50.0000 mg | ORAL_TABLET | Freq: Once | ORAL | Status: AC
  Administered 2012-12-22: 50 mg via ORAL
  Filled 2012-12-22: qty 1

## 2012-12-22 NOTE — ED Notes (Signed)
Pt reports swelling to the right wrist. States he had surgery about 4 years ago and has metal in that wrist. Pt does lift heavy things at his job and noticed the swelling last night while at work. Sensation intact.

## 2012-12-22 NOTE — ED Notes (Signed)
Ortho en route.  

## 2012-12-22 NOTE — Progress Notes (Signed)
Orthopedic Tech Progress Note Patient Details:  Brandon Quinn 1966-05-02 [id number]  Ortho Devices Type of Ortho Device: Wrist splint Ortho Device/Splint Interventions: Application   Cammer, Mickie Bail 12/22/2012, 2:49 PM

## 2012-12-22 NOTE — ED Provider Notes (Signed)
History     CSN: [id number]  Arrival date & time 12/22/12  1231   First MD Initiated Contact with Patient 12/22/12 1242      Chief Complaint  Patient presents with  . Wrist Pain    (Consider location/radiation/quality/duration/timing/severity/associated sxs/prior treatment) HPI Comments: Patient is a 47 year old male with a surgical history of right wrist surgery where he had metal hardware placed who presents with a 1 day history of right wrist pain. The pain is dull and moderate without radiation. The pain started gradually and progressively worsened since the onset. He denies any injury but does reports heavy lifting at work and thinks he may have injured something in his wrist. He reports associated swelling. He has not tried anything for symptoms. Movement makes the pain worse. Nothing makes the pain better.    Past Medical History  Diagnosis Date  . Hypertension   . Chest pain     a. 07/2012 Lexiscan Sestamibi:  no ischemia/infarct, nl EF.  . Tobacco abuse     Past Surgical History  Procedure Laterality Date  . Wrist fracture surgery  ~ 2009    right; "put a plate in it" (40/98/1191)    Family History  Problem Relation Age of Onset  . Other      No known heart disease    History  Substance Use Topics  . Smoking status: Current Every Day Smoker -- 0.50 packs/day for 26 years    Types: Cigarettes  . Smokeless tobacco: Never Used  . Alcohol Use: 7.2 oz/week    12 Cans of beer per week     Comment: 07/30/2012 "1-2 beers/day; maybe 12/wk"      Review of Systems  Musculoskeletal: Positive for joint swelling and arthralgias.    Allergies  Review of patient's allergies indicates no known allergies.  Home Medications   Current Outpatient Rx  Name  Route  Sig  Dispense  Refill  . amoxicillin-clavulanate (AUGMENTIN) 875-125 MG per tablet   Oral   Take 1 tablet by mouth 2 (two) times daily. One po bid x 7 days         . carvedilol (COREG) 6.25 MG tablet    Oral   Take 1 tablet (6.25 mg total) by mouth 2 (two) times daily with a meal.   60 tablet   0   . ibuprofen (ADVIL,MOTRIN) 200 MG tablet   Oral   Take 800 mg by mouth every 6 (six) hours as needed for pain.           BP 173/88  Pulse 73  Temp(Src) 97.9 F (36.6 C) (Oral)  Resp 18  SpO2 97%  Physical Exam  Nursing note and vitals reviewed. Constitutional: He is oriented to person, place, and time. He appears well-developed and well-nourished. No distress.  HENT:  Head: Normocephalic and atraumatic.  Eyes: Conjunctivae are normal.  Neck: Normal range of motion. Neck supple.  Cardiovascular: Normal rate and regular rhythm.  Exam reveals no gallop and no friction rub.   No murmur heard. Pulmonary/Chest: Effort normal and breath sounds normal. He has no wheezes. He has no rales. He exhibits no tenderness.  Abdominal: Soft. There is no tenderness.  Musculoskeletal: Normal range of motion.  Generalized right wrist swelling. No obvious deformity. Generalized tenderness to palpation of right wrist without focal bony tenderness.   Neurological: He is alert and oriented to person, place, and time. Coordination normal.  Speech is goal-oriented. Moves limbs without ataxia.   Skin: Skin  is warm and dry.  Psychiatric: He has a normal mood and affect. His behavior is normal.    ED Course  Procedures (including critical care time)  Labs Reviewed - No data to display Dg Wrist Complete Right  12/22/2012  *RADIOLOGY REPORT*  Clinical Data: History of previous fracture with fixation.  Wrist pain for 2 days  RIGHT WRIST - COMPLETE 3+ VIEW  Comparison: None.  Findings: Postsurgical changes are noted in the distal radius.  No acute fracture is seen.  No dislocation or soft tissue abnormality is noted.  IMPRESSION: Postoperative change without acute abnormality.   Original Report Authenticated By: Alcide Clever, M.D.      1. Right wrist sprain, initial encounter       MDM  2:28  PM Patient's xray negative for acute changes. Patient will have a wrist splint for comfort and tramadol for pain. Patient advised to rest, ice and elevated injury. No further evaluation needed at this time. No signs of neurovascular compromise.        Emilia Beck, PA-C 12/25/12 1316

## 2012-12-22 NOTE — ED Notes (Signed)
States was at work yesterday and 4 hours in his rt wrist started to swell and he tok 4 motrin yesterday but nothing today has hx of surgery on that wrist has good pulse and can wiggle fingers good neuro

## 2012-12-28 NOTE — ED Provider Notes (Signed)
Medical screening examination/treatment/procedure(s) were performed by non-physician practitioner and as supervising physician I was immediately available for consultation/collaboration.   Carleene Cooper III, MD 12/28/12 [phone]

## 2012-12-29 ENCOUNTER — Emergency Department (HOSPITAL_COMMUNITY)
Admission: EM | Admit: 2012-12-29 | Discharge: 2012-12-30 | Disposition: A | Discharge status: Home / Self Care | Payer: BC Managed Care – PPO | Attending: Emergency Medicine | Admitting: Emergency Medicine

## 2012-12-29 ENCOUNTER — Encounter (HOSPITAL_COMMUNITY): Payer: Self-pay

## 2012-12-29 DIAGNOSIS — Z9889 Other specified postprocedural states: Secondary | ICD-10-CM | POA: Insufficient documentation

## 2012-12-29 DIAGNOSIS — G8929 Other chronic pain: Secondary | ICD-10-CM

## 2012-12-29 DIAGNOSIS — Z8673 Personal history of transient ischemic attack (TIA), and cerebral infarction without residual deficits: Secondary | ICD-10-CM | POA: Insufficient documentation

## 2012-12-29 DIAGNOSIS — I10 Essential (primary) hypertension: Secondary | ICD-10-CM | POA: Insufficient documentation

## 2012-12-29 DIAGNOSIS — G8928 Other chronic postprocedural pain: Secondary | ICD-10-CM | POA: Insufficient documentation

## 2012-12-29 DIAGNOSIS — M25539 Pain in unspecified wrist: Secondary | ICD-10-CM | POA: Insufficient documentation

## 2012-12-29 DIAGNOSIS — Z79899 Other long term (current) drug therapy: Secondary | ICD-10-CM | POA: Insufficient documentation

## 2012-12-29 DIAGNOSIS — F17200 Nicotine dependence, unspecified, uncomplicated: Secondary | ICD-10-CM | POA: Insufficient documentation

## 2012-12-29 NOTE — ED Notes (Signed)
Patient presents with c/o right wrist pain. Has history of right wrist surgery (2010) with metal hardware placement. Was seen on 12/22/12 for the same. Patient states that the tramadol doesn't help the pain. He reports being "tired" of being in pain.

## 2012-12-30 MED ORDER — OXYCODONE-ACETAMINOPHEN 5-325 MG PO TABS
1.0000 | ORAL_TABLET | Freq: Once | ORAL | Status: AC
  Administered 2012-12-30: 1 via ORAL
  Filled 2012-12-30 (×2): qty 1

## 2012-12-30 NOTE — ED Notes (Signed)
Pt c/o continued pain to right wrist.  St's he had a fx that required a plate in 1478.  St's he was seen and tx at [facility].  Pt st's Tramadol that he was given does not help the pain.

## 2012-12-30 NOTE — ED Provider Notes (Signed)
Medical screening examination/treatment/procedure(s) were performed by non-physician practitioner and as supervising physician I was immediately available for consultation/collaboration.  Parissa Chiao K Nameer Summer, MD 12/30/12 0335 

## 2012-12-30 NOTE — ED Provider Notes (Signed)
History     CSN: [id number]  Arrival date & time 12/29/12  2328   First MD Initiated Contact with Patient 12/29/12 2341      Chief Complaint  Patient presents with  . Wrist Pain   HPI  History provided by the patient. Patient is a 47 year old male history of hypertension previous right wrist fracture status post orthopedic surgery and metal rods plates who presents with complaints of continued right wrist pains. Pains have been ongoing for the past 3 months or more. The pain is worse with any movements of the wrist. Symptoms are sometimes associated with swelling around the wrist and hand. Denies any associated numbness or weakness. Patient was seen one week ago for similar symptoms. He was provided a wrist splint and recommended to use RICE treatment. Patient states he has used a wrist brace occasionally at work. He has not been consistently using RICE treatment as instructed. He was also given prescription of tramadol but states these did not help significantly. He is not using other treatments. He has not attempted to followup with any other outpatient providers. Denies any new injury or traumas. No other changing symptoms and chronic pains.     Past Medical History  Diagnosis Date  . Hypertension   . Chest pain     a. 07/2012 Lexiscan Sestamibi:  no ischemia/infarct, nl EF.  . Tobacco abuse     Past Surgical History  Procedure Laterality Date  . Wrist fracture surgery  ~ 2009    right; "put a plate in it" (40/98/1191)    Family History  Problem Relation Age of Onset  . Other      No known heart disease    History  Substance Use Topics  . Smoking status: Current Every Day Smoker -- 0.50 packs/day for 26 years    Types: Cigarettes  . Smokeless tobacco: Never Used  . Alcohol Use: 7.2 oz/week    12 Cans of beer per week     Comment: 07/30/2012 "1-2 beers/day; maybe 12/wk"      Review of Systems  Neurological: Negative for weakness and numbness.  All other systems  reviewed and are negative.    Allergies  Review of patient's allergies indicates no known allergies.  Home Medications   Current Outpatient Rx  Name  Route  Sig  Dispense  Refill  . amoxicillin-clavulanate (AUGMENTIN) 875-125 MG per tablet   Oral   Take 1 tablet by mouth 2 (two) times daily. One po bid x 7 days         . carvedilol (COREG) 6.25 MG tablet   Oral   Take 1 tablet (6.25 mg total) by mouth 2 (two) times daily with a meal.   60 tablet   0   . ibuprofen (ADVIL,MOTRIN) 200 MG tablet   Oral   Take 800 mg by mouth every 6 (six) hours as needed for pain.         . traMADol (ULTRAM) 50 MG tablet   Oral   Take 1 tablet (50 mg total) by mouth every 6 (six) hours as needed for pain.   15 tablet   0     BP 171/83  Pulse 78  Temp(Src) 97.5 F (36.4 C) (Oral)  Resp 18  SpO2 100%  Physical Exam  Nursing note and vitals reviewed. Constitutional: He is oriented to person, place, and time. He appears well-developed and well-nourished. No distress.  HENT:  Head: Normocephalic and atraumatic.  Eyes: Conjunctivae are normal.  Cardiovascular:  Normal rate and regular rhythm.   Pulmonary/Chest: Effort normal and breath sounds normal.  Musculoskeletal: He exhibits tenderness. He exhibits no edema.  Slightly reduced range of motion of right wrist secondary to pain. Pain is primarily over the dorsal distal radius area and carpal bones. No gross deformities or significant swelling. Skin color normal. Normal radial pulses. Normal distal sensations to light touch with normal cap refill less than 2 seconds.  Neurological: He is alert and oriented to person, place, and time.  Skin: Skin is warm. No rash noted. No erythema.  Psychiatric: He has a normal mood and affect. His behavior is normal.    ED Course  Procedures       1. Chronic wrist pain, right       MDM  12:50 AM patient seen and evaluated. Patient appears well does not appear in any acute distress with  significant discomfort.        Angus Seller, PA-C 12/30/12 [phone]

## 2013-02-25 ENCOUNTER — Emergency Department (INDEPENDENT_AMBULATORY_CARE_PROVIDER_SITE_OTHER): Payer: BC Managed Care – PPO

## 2013-02-25 ENCOUNTER — Encounter (HOSPITAL_COMMUNITY): Payer: Self-pay | Admitting: Emergency Medicine

## 2013-02-25 ENCOUNTER — Emergency Department (INDEPENDENT_AMBULATORY_CARE_PROVIDER_SITE_OTHER)
Admission: EM | Admit: 2013-02-25 | Discharge: 2013-02-25 | Disposition: A | Discharge status: Home / Self Care | Payer: BC Managed Care – PPO | Source: Home / Self Care | Attending: Emergency Medicine | Admitting: Emergency Medicine

## 2013-02-25 DIAGNOSIS — S20229A Contusion of unspecified back wall of thorax, initial encounter: Secondary | ICD-10-CM

## 2013-02-25 DIAGNOSIS — S300XXA Contusion of lower back and pelvis, initial encounter: Secondary | ICD-10-CM

## 2013-02-25 MED ORDER — TRAMADOL HCL 50 MG PO TABS: 50.0000 mg | ORAL_TABLET | Freq: Four times a day (QID) | ORAL | Status: DC | PRN

## 2013-02-25 MED ORDER — IBUPROFEN 800 MG PO TABS
800.0000 mg | ORAL_TABLET | Freq: Once | ORAL | Status: AC
  Administered 2013-02-25: 800 mg via ORAL

## 2013-02-25 MED ORDER — IBUPROFEN 800 MG PO TABS
ORAL_TABLET | ORAL | Status: AC
  Filled 2013-02-25: qty 1

## 2013-02-25 NOTE — ED Notes (Signed)
Pt reports he fell down a flight of stairs yesterday, slipped due to vomit on the steps from another family member. Lower and mid  back hurts. 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 Took ibuprofen with no relief. Hurts to bend. Patient is alert and oriented.

## 2013-02-25 NOTE — ED Provider Notes (Signed)
History     CSN: [id number]  Arrival date & time 02/25/13  1318   First MD Initiated Contact with Patient 02/25/13 1400      Chief Complaint  Patient presents with  . Fall    (Consider location/radiation/quality/duration/timing/severity/associated sxs/prior treatment) HPI Comments: Patient presents urgent care this afternoon complaining that he fell down flight of stairs yesterday as he slipped on a bottle at from another family member. Hitting his lower back. He is having pain in his lower back that exacerbates with movement and lumbar flexion. He denies any further symptoms such as weakness or paresthesias of his lower extremities. No bowel pain no head injury he has taken ibuprofen yesterday a couple times with very little relief.  Patient is a 47 y.o. male presenting with fall. The history is provided by the patient.  Fall This is a new problem. The current episode started yesterday. The problem occurs constantly. The problem has not changed since onset.Pertinent negatives include no abdominal pain, no headaches and no shortness of breath. The symptoms are aggravated by bending and walking. The symptoms are relieved by rest. He has tried a cold compress and rest for the symptoms. The treatment provided mild relief.    Past Medical History  Diagnosis Date  . Hypertension   . Chest pain     a. 07/2012 Lexiscan Sestamibi:  no ischemia/infarct, nl EF.  . Tobacco abuse     Past Surgical History  Procedure Laterality Date  . Wrist fracture surgery  ~ 2009    right; "put a plate in it" (84/13/2440)    Family History  Problem Relation Age of Onset  . Other      No known heart disease    History  Substance Use Topics  . Smoking status: Current Every Day Smoker -- 0.50 packs/day for 26 years    Types: Cigarettes  . Smokeless tobacco: Never Used  . Alcohol Use: 7.2 oz/week    12 Cans of beer per week     Comment: 07/30/2012 "1-2 beers/day; maybe 12/wk"      Review of  Systems  Constitutional: Positive for activity change. Negative for fever, appetite change, fatigue and unexpected weight change.  Respiratory: Negative for cough and shortness of breath.   Gastrointestinal: Negative for abdominal pain.  Genitourinary: Negative for dysuria, frequency and flank pain.  Musculoskeletal: Positive for back pain. Negative for myalgias, joint swelling and arthralgias.  Skin: Negative for color change, rash and wound.  Neurological: Negative for dizziness, weakness, numbness and headaches.    Allergies  Review of patient's allergies indicates no known allergies.  Home Medications   Current Outpatient Rx  Name  Route  Sig  Dispense  Refill  . amoxicillin-clavulanate (AUGMENTIN) 875-125 MG per tablet   Oral   Take 1 tablet by mouth 2 (two) times daily. One po bid x 7 days         . carvedilol (COREG) 6.25 MG tablet   Oral   Take 1 tablet (6.25 mg total) by mouth 2 (two) times daily with a meal.   60 tablet   0   . traMADol (ULTRAM) 50 MG tablet   Oral   Take 1 tablet (50 mg total) by mouth every 6 (six) hours as needed for pain.   15 tablet   0   . traMADol (ULTRAM) 50 MG tablet   Oral   Take 1 tablet (50 mg total) by mouth every 6 (six) hours as needed for pain.   15  tablet   0     BP 138/60  Pulse 76  Temp(Src) 98.3 F (36.8 C) (Oral)  Resp 16  SpO2 100%  Physical Exam  Nursing note and vitals reviewed. Constitutional: He appears well-developed and well-nourished.  Abdominal: Soft.  Musculoskeletal: He exhibits tenderness. He exhibits no edema.       Lumbar back: He exhibits decreased range of motion, tenderness, bony tenderness and pain. He exhibits no swelling, no edema, no deformity, no laceration and normal pulse.       Back:  Neurological: He is alert.  Skin: Skin is warm. No rash noted.    ED Course  Procedures (including critical care time)  Labs Reviewed - No data to display Dg Lumbar Spine Complete  02/25/2013    *RADIOLOGY REPORT*  Clinical Data: Pain post trauma  LUMBAR SPINE - COMPLETE 4+ VIEW  Comparison: None.  Findings: Frontal, lateral, spot lumbosacral lateral, and bilateral oblique views were obtained.  There are four non-rib bearing lumbar type vertebral bodies.  There is mild levoscoliosis.  There is no fracture or spondylolisthesis.  Disc spaces appear intact.  There is no appreciable facet arthropathy.  IMPRESSION: Scoliosis.  No fracture or appreciable arthropathic change.   Original Report Authenticated By: Bretta Bang, M.D.     1. Lumbar contusion, initial encounter       MDM  Lumbar contusion- To use cryotherapy- and NSAID AND ULTRAM FOR PAIN- FOLLOW-UP WITH ORTHO IF PAIN PERSIST AFTER 10 DAYS        Jimmie Molly, MD 02/25/13 1520

## 2013-04-03 ENCOUNTER — Encounter (HOSPITAL_COMMUNITY): Payer: Self-pay | Admitting: *Deleted

## 2013-04-03 ENCOUNTER — Emergency Department (HOSPITAL_COMMUNITY)
Admission: EM | Admit: 2013-04-03 | Discharge: 2013-04-03 | Disposition: A | Discharge status: Home / Self Care | Payer: BC Managed Care – PPO | Attending: Emergency Medicine | Admitting: Emergency Medicine

## 2013-04-03 ENCOUNTER — Emergency Department (HOSPITAL_COMMUNITY): Payer: BC Managed Care – PPO

## 2013-04-03 DIAGNOSIS — I10 Essential (primary) hypertension: Secondary | ICD-10-CM | POA: Insufficient documentation

## 2013-04-03 DIAGNOSIS — R0602 Shortness of breath: Secondary | ICD-10-CM | POA: Insufficient documentation

## 2013-04-03 DIAGNOSIS — F17200 Nicotine dependence, unspecified, uncomplicated: Secondary | ICD-10-CM | POA: Insufficient documentation

## 2013-04-03 DIAGNOSIS — R079 Chest pain, unspecified: Secondary | ICD-10-CM

## 2013-04-03 LAB — BASIC METABOLIC PANEL
CO2: 24 mEq/L (ref 19–32)
Calcium: 8.6 mg/dL (ref 8.4–10.5)
GFR calc Af Amer: >90 mL/min (ref >90)
GFR calc non Af Amer: >90 mL/min (ref >90)
Sodium: 136 mEq/L (ref 135–145)

## 2013-04-03 LAB — POCT I-STAT TROPONIN I: Troponin i, poc: 0.04 ng/mL (ref 0.00–0.08)

## 2013-04-03 LAB — CBC
MCH: 30.4 pg (ref 26.0–34.0)
MCHC: 35.6 g/dL (ref 30.0–36.0)
Platelets: 193 10*3/uL (ref 150–400)
RBC: 4.9 MIL/uL (ref 4.22–5.81)

## 2013-04-03 MED ORDER — CARVEDILOL 6.25 MG PO TABS
6.2500 mg | ORAL_TABLET | ORAL | Status: AC
  Administered 2013-04-03: 6.25 mg via ORAL
  Filled 2013-04-03: qty 1

## 2013-04-03 MED ORDER — CARVEDILOL 6.25 MG PO TABS: 6.2500 mg | ORAL_TABLET | Freq: Two times a day (BID) | ORAL | Status: DC

## 2013-04-03 NOTE — ED Provider Notes (Signed)
History    CSN: [id number] Arrival date & time 04/03/13  1531  First MD Initiated Contact with Patient 04/03/13 1636     Chief Complaint  Patient presents with  . Chest Pain  . Hypertension   (Consider location/radiation/quality/duration/timing/severity/associated sxs/prior Treatment) HPI Comments: Patient is a 47 year old male with a history of hypertension who presents for left-sided chest pain with onset 3 days ago. Patient states the pain is sharp, nonradiating, and without aggravating or alleviating factors. Symptoms are associated with intermittent shortness of breath. Patient denies fevers, vision changes, jaw pain, nausea or vomiting, numbness or tingling, extremity weakness. Patient states that symptoms are frequently associated with medication noncompliance. Patient takes carvedilol 6.25 mg twice a day and states that he ran out of his medication 2 days ago. Patient denies having a cardiologist or primary physician. Records indicate the patient had LexiScan done in October 2013 which showed no ischemia or infarct and a normal EF.  Patient is a 47 y.o. male presenting with chest pain and hypertension. The history is provided by the patient. No language interpreter was used.  Chest Pain Associated symptoms: shortness of breath (intermittent)   Associated symptoms: no fever, no nausea, no numbness, not vomiting and no weakness   Hypertension Associated symptoms include chest pain. Pertinent negatives include no fever, nausea, numbness, vomiting or weakness.   Past Medical History  Diagnosis Date  . Hypertension   . Chest pain     a. 07/2012 Lexiscan Sestamibi:  no ischemia/infarct, nl EF.  . Tobacco abuse    Past Surgical History  Procedure Laterality Date  . Wrist fracture surgery  ~ 2009    right; "put a plate in it" (40/98/1191)   Family History  Problem Relation Age of Onset  . Other      No known heart disease   History  Substance Use Topics  . Smoking status:  Current Every Day Smoker -- 0.50 packs/day for 26 years    Types: Cigarettes  . Smokeless tobacco: Never Used  . Alcohol Use: 7.2 oz/week    12 Cans of beer per week     Comment: 07/30/2012 "1-2 beers/day; maybe 12/wk"    Review of Systems  Constitutional: Negative for fever.  Eyes: Negative for visual disturbance.  Respiratory: Positive for shortness of breath (intermittent).   Cardiovascular: Positive for chest pain.  Gastrointestinal: Negative for nausea and vomiting.  Neurological: Negative for weakness and numbness.  All other systems reviewed and are negative.    Allergies  Review of patient's allergies indicates no known allergies.  Home Medications   Current Outpatient Rx  Name  Route  Sig  Dispense  Refill  . traMADol (ULTRAM) 50 MG tablet   Oral   Take 50 mg by mouth every 6 (six) hours as needed for pain.         . carvedilol (COREG) 6.25 MG tablet   Oral   Take 1 tablet (6.25 mg total) by mouth 2 (two) times daily with a meal.   60 tablet   0    BP 156/83  Pulse 66  Temp(Src) 97.9 F (36.6 C) (Oral)  Resp 22  SpO2 97%  Physical Exam  Nursing note and vitals reviewed. Constitutional: He is oriented to person, place, and time. He appears well-developed and well-nourished. No distress.  HENT:  Head: Normocephalic and atraumatic.  Mouth/Throat: Oropharynx is clear and moist. No oropharyngeal exudate.  Eyes: Conjunctivae and EOM are normal. Pupils are equal, round, and reactive to light.  No scleral icterus.  Neck: Normal range of motion. Neck supple.  Cardiovascular: Normal rate, regular rhythm and normal heart sounds.   Pulmonary/Chest: Effort normal and breath sounds normal. No respiratory distress. He has no wheezes. He has no rales.  Abdominal: Soft. He exhibits no distension. There is no tenderness. There is no rebound.  Musculoskeletal: Normal range of motion.  Neurological: He is alert and oriented to person, place, and time.  Skin: Skin is  warm and dry. No rash noted. He is not diaphoretic. No erythema. No pallor.  Psychiatric: He has a normal mood and affect. His behavior is normal.    ED Course  Procedures (including critical care time) Labs Reviewed  BASIC METABOLIC PANEL - Abnormal; Notable for the following:    Potassium 3.4 (*)    Glucose, Bld 126 (*)    All other components within normal limits  CBC  POCT I-STAT TROPONIN I  POCT I-STAT TROPONIN I   Dg Chest 2 View  04/03/2013   *RADIOLOGY REPORT*  Clinical Data: Chest pain congestion.  CHEST - 2 VIEW  Comparison: 12/14/2012.  Findings: The heart, mediastinum and hilar contours are normal. The lungs are clear.  There are no effusions or pneumothoraces. There are no acute bony changes.  Degenerative changes are seen in the thoracic spine.  IMPRESSION: No active disease.   Original Report Authenticated By: Sander Radon, M.D.    Date: 04/03/2013  Rate: 86  Rhythm: normal sinus rhythm  QRS Axis: normal  Intervals: normal  ST/T Wave abnormalities: nonspecific ST/T changes; T wave inversion in II, III, aVF, and V4-V6   Conduction Disutrbances:none  Narrative Interpretation: NSR with changes as above; no STEMI  Old EKG Reviewed: unchanged from 12/14/2012 I have personally reviewed and interpreted this EKG   1. Chest pain     MDM  Atypical chest pain - Patient noncompliant with blood pressure medication as he ran out 2 days ago. No significant physical exam findings and EKG unchanged from prior on 12/14/12. Labs without leukocytosis, anemia, hemoconcentration, or electrolyte imbalance. Kidney function preserved. Troponin x2 within normal limits. Chest x-ray also without evidence of pneumonia, pneumothorax, pleural effusion, or other acute cardiopulmonary abnormality. Patient given daily dose of carvedilol in ED; blood pressure now consistently stable around 153/80.   Patient states he no longer has any chest pain and has been asymptomatic for majority of ED course.  Low suspicion for ACS given physical exam, atypical nature of symptoms, and reassuring labs. Supported by TIMI score of 0. Patient appropriate for discharge with primary care and cardiology followup for further evaluation of symptoms. Rx for carvedilol given and indications for ED return discussed. Patient verbalizes comfort and understanding with this discharge plan with no unaddressed concerns.       Antony Madura, PA-C 04/03/13 2019

## 2013-04-03 NOTE — ED Notes (Signed)
Pt reports having htn, has been out of meds x 2 days. bp 190/79 at triage. Reports also having left side chest pains, ekg done at triage.

## 2013-04-03 NOTE — ED Provider Notes (Signed)
Medical screening examination/treatment/procedure(s) were performed by non-physician practitioner and as supervising physician I was immediately available for consultation/collaboration.   Marcena Dias, MD 04/03/13 2331 

## 2013-04-03 NOTE — ED Notes (Signed)
Pt c/o left sided cp with no radiation described as heaviness. Pt rates pain 8/10. Pt states he has been having the chest pain x 52mo. Pt states he wakes up at night with sob. Pt also states he has not taken his b/p medications x 2 days.

## 2013-05-04 ENCOUNTER — Other Ambulatory Visit (HOSPITAL_COMMUNITY)
Admission: RE | Admit: 2013-05-04 | Discharge: 2013-05-04 | Disposition: A | Discharge status: Home / Self Care | Payer: BC Managed Care – PPO | Source: Ambulatory Visit | Attending: Emergency Medicine | Admitting: Emergency Medicine

## 2013-05-04 ENCOUNTER — Emergency Department (INDEPENDENT_AMBULATORY_CARE_PROVIDER_SITE_OTHER)
Admission: EM | Admit: 2013-05-04 | Discharge: 2013-05-04 | Disposition: A | Discharge status: Home / Self Care | Payer: BC Managed Care – PPO | Source: Home / Self Care | Attending: Emergency Medicine | Admitting: Emergency Medicine

## 2013-05-04 ENCOUNTER — Encounter (HOSPITAL_COMMUNITY): Payer: Self-pay | Admitting: Emergency Medicine

## 2013-05-04 DIAGNOSIS — Z113 Encounter for screening for infections with a predominantly sexual mode of transmission: Secondary | ICD-10-CM | POA: Insufficient documentation

## 2013-05-04 DIAGNOSIS — N341 Nonspecific urethritis: Secondary | ICD-10-CM

## 2013-05-04 LAB — POCT URINALYSIS DIP (DEVICE)
Nitrite: NEGATIVE
Protein, ur: NEGATIVE mg/dL
Urobilinogen, UA: 0.2 mg/dL (ref 0.0–1.0)
pH: 7 (ref 5.0–8.0)

## 2013-05-04 MED ORDER — CEFTRIAXONE SODIUM 1 G IJ SOLR
1.0000 g | Freq: Once | INTRAMUSCULAR | Status: AC
  Administered 2013-05-04: 1 g via INTRAMUSCULAR

## 2013-05-04 MED ORDER — CEFTRIAXONE SODIUM 1 G IJ SOLR
INTRAMUSCULAR | Status: AC
  Filled 2013-05-04: qty 10

## 2013-05-04 MED ORDER — DOXYCYCLINE HYCLATE 100 MG PO CAPS: 100.0000 mg | ORAL_CAPSULE | Freq: Two times a day (BID) | ORAL | Status: DC

## 2013-05-04 MED ORDER — LIDOCAINE HCL (PF) 1 % IJ SOLN
INTRAMUSCULAR | Status: AC
  Filled 2013-05-04: qty 5

## 2013-05-04 NOTE — ED Provider Notes (Signed)
CSN: [id number]     Arrival date & time 05/04/13  6295 History     First MD Initiated Contact with Patient 05/04/13 [phone]     Chief Complaint  Patient presents with  . SEXUALLY TRANSMITTED DISEASE   (Consider location/radiation/quality/duration/timing/severity/associated sxs/prior Treatment) HPI Comments: 47 year old male presents for evaluation of urethral discharge and pain in the mid shaft of his penis that began about 2 weeks ago. The discharge stopped about one week ago but he still has a strange feeling in the mid shaft of the penis when he presses on it, not necessarily pain, but describes as just not feeling right. He has experienced something like this many years ago and was treated for an STD. However, at this time he denies any recent sexual activity. He denies any pain in the testicles, abdominal pain, fever, chills, lower back pain, dysuria, hematuria.   Past Medical History  Diagnosis Date  . Hypertension   . Chest pain     a. 07/2012 Lexiscan Sestamibi:  no ischemia/infarct, nl EF.  . Tobacco abuse    Past Surgical History  Procedure Laterality Date  . Wrist fracture surgery  ~ 2009    right; "put a plate in it" (32/44/0102)   Family History  Problem Relation Age of Onset  . Other      No known heart disease   History  Substance Use Topics  . Smoking status: Current Every Day Smoker -- 0.50 packs/day for 26 years    Types: Cigarettes  . Smokeless tobacco: Never Used  . Alcohol Use: 7.2 oz/week    12 Cans of beer per week     Comment: 07/30/2012 "1-2 beers/day; maybe 12/wk"    Review of Systems  Constitutional: Negative for fever, chills and fatigue.  HENT: Negative for sore throat, neck pain and neck stiffness.   Eyes: Negative for visual disturbance.  Respiratory: Negative for cough and shortness of breath.   Cardiovascular: Negative for chest pain, palpitations and leg swelling.  Gastrointestinal: Negative for nausea, vomiting, abdominal pain, diarrhea  and constipation.  Genitourinary: Positive for discharge and penile pain. Negative for dysuria, urgency, frequency, hematuria, flank pain, decreased urine volume, penile swelling, scrotal swelling, difficulty urinating, genital sores and testicular pain.  Musculoskeletal: Negative for myalgias and arthralgias.  Skin: Negative for rash.  Neurological: Negative for dizziness, weakness and light-headedness.    Allergies  Review of patient's allergies indicates no known allergies.  Home Medications   Current Outpatient Rx  Name  Route  Sig  Dispense  Refill  . carvedilol (COREG) 6.25 MG tablet   Oral   Take 1 tablet (6.25 mg total) by mouth 2 (two) times daily with a meal.   60 tablet   0   . doxycycline (VIBRAMYCIN) 100 MG capsule   Oral   Take 1 capsule (100 mg total) by mouth 2 (two) times daily.   14 capsule   0   . traMADol (ULTRAM) 50 MG tablet   Oral   Take 50 mg by mouth every 6 (six) hours as needed for pain.          BP 166/98  Pulse 72  Temp(Src) 98.3 F (36.8 C) (Oral)  Resp 16  SpO2 100% Physical Exam  Nursing note and vitals reviewed. Constitutional: He is oriented to person, place, and time. He appears well-developed and well-nourished. No distress.  HENT:  Head: Normocephalic and atraumatic.  Abdominal: Soft. He exhibits no mass. There is no tenderness.  Genitourinary: Testes normal and  penis normal. Right testis shows no mass, no swelling and no tenderness. Left testis shows no mass, no swelling and no tenderness. No penile erythema or penile tenderness. No discharge found.  Lymphadenopathy:       Right: Inguinal (nontender) adenopathy present.       Left: Inguinal (nontender) adenopathy present.  Neurological: He is alert and oriented to person, place, and time. Coordination normal.  Skin: Skin is warm and dry. No rash noted. He is not diaphoretic.  Psychiatric: He has a normal mood and affect.    ED Course   Procedures (including critical care  time)  Labs Reviewed  POCT URINALYSIS DIP (DEVICE) - Abnormal; Notable for the following:    Hgb urine dipstick TRACE (*)    All other components within normal limits  URINE CULTURE  URINE CYTOLOGY ANCILLARY ONLY   No results found. 1. Urethritis, nonspecific     MDM  There is no discharge on the exam. Not sexually active. Sending urine for GC, Chlamydia, Trichomonas. Also sending a urine culture. Treat with Rocephin here and doxycycline twice a day for one week.   Meds ordered this encounter  Medications  . cefTRIAXone (ROCEPHIN) injection 1 g    Sig:   . doxycycline (VIBRAMYCIN) 100 MG capsule    Sig: Take 1 capsule (100 mg total) by mouth 2 (two) times daily.    Dispense:  14 capsule    Refill:  0     Graylon Good, PA-C 05/04/13 [phone]

## 2013-05-04 NOTE — ED Notes (Signed)
Pt c/o penis pain and discharge x 2 weeks. Denies fever, n/v/d. Denies being sexually active right now. Pt is alert and oriented.

## 2013-05-04 NOTE — ED Provider Notes (Signed)
Medical screening examination/treatment/procedure(s) were performed by non-physician practitioner and as supervising physician I was immediately available for consultation/collaboration.  Raynald Blend, MD 05/04/13 1228

## 2013-05-05 LAB — URINE CULTURE

## 2013-05-06 ENCOUNTER — Telehealth (HOSPITAL_COMMUNITY): Payer: Self-pay | Admitting: *Deleted

## 2013-05-06 NOTE — ED Notes (Signed)
GC neg., Chlamydia pos., Trich neg.  Pt. adequately treated with Doxycycline. I called pt. Pt. verified x 2 and given results.  Pt. told he is adequately treated and to finish all of antibiotics.  Pt. instructed to notify his partner, no sex for 1 week and to practice safe sex. Pt. told they can get HIV testing at the [facility]. STD clinic by appointment.  Pt. voiced understanding.  [employer]HS form completed and faxed to the [facility] Department. Vassie Moselle 05/06/2013

## 2013-12-31 ENCOUNTER — Encounter (HOSPITAL_COMMUNITY): Payer: Self-pay | Admitting: Emergency Medicine

## 2013-12-31 ENCOUNTER — Emergency Department (HOSPITAL_COMMUNITY)
Admission: EM | Admit: 2013-12-31 | Discharge: 2013-12-31 | Disposition: A | Discharge status: Home / Self Care | Payer: BC Managed Care – PPO | Attending: Emergency Medicine | Admitting: Emergency Medicine

## 2013-12-31 DIAGNOSIS — B370 Candidal stomatitis: Secondary | ICD-10-CM | POA: Insufficient documentation

## 2013-12-31 DIAGNOSIS — J029 Acute pharyngitis, unspecified: Secondary | ICD-10-CM | POA: Insufficient documentation

## 2013-12-31 DIAGNOSIS — R05 Cough: Secondary | ICD-10-CM | POA: Insufficient documentation

## 2013-12-31 DIAGNOSIS — R059 Cough, unspecified: Secondary | ICD-10-CM | POA: Insufficient documentation

## 2013-12-31 DIAGNOSIS — F17200 Nicotine dependence, unspecified, uncomplicated: Secondary | ICD-10-CM | POA: Insufficient documentation

## 2013-12-31 DIAGNOSIS — I10 Essential (primary) hypertension: Secondary | ICD-10-CM

## 2013-12-31 DIAGNOSIS — Z79899 Other long term (current) drug therapy: Secondary | ICD-10-CM | POA: Insufficient documentation

## 2013-12-31 DIAGNOSIS — R55 Syncope and collapse: Secondary | ICD-10-CM | POA: Insufficient documentation

## 2013-12-31 MED ORDER — NYSTATIN 100000 UNIT/ML MT SUSP: 500000.0000 [IU] | Freq: Four times a day (QID) | OROMUCOSAL | Status: AC

## 2013-12-31 MED ORDER — HYDROCHLOROTHIAZIDE 12.5 MG PO CAPS
12.5000 mg | ORAL_CAPSULE | Freq: Every day | ORAL | Status: DC
  Administered 2013-12-31: 12.5 mg via ORAL
  Filled 2013-12-31: qty 1

## 2013-12-31 MED ORDER — HYDROCHLOROTHIAZIDE 12.5 MG PO TABS: 12.5000 mg | ORAL_TABLET | Freq: Every day | ORAL | Status: DC

## 2013-12-31 NOTE — Discharge Instructions (Signed)
Hypertension As your heart beats, it forces blood through your arteries. This force is your blood pressure. If the pressure is too high, it is called hypertension (HTN) or high blood pressure. HTN is dangerous because you may have it and not know it. High blood pressure may mean that your heart has to work harder to pump blood. Your arteries may be narrow or stiff. The extra work puts you at risk for heart disease, stroke, and other problems.  Blood pressure consists of two numbers, a higher number over a lower, 110/72, for example. It is stated as "110 over 72." The ideal is below 120 for the top number (systolic) and under 80 for the bottom (diastolic). Write down your blood pressure today. You should pay close attention to your blood pressure if you have certain conditions such as:  Heart failure.  Prior heart attack.  Diabetes  Chronic kidney disease.  Prior stroke.  Multiple risk factors for heart disease. To see if you have HTN, your blood pressure should be measured while you are seated with your arm held at the level of the heart. It should be measured at least twice. A one-time elevated blood pressure reading (especially in the Emergency Department) does not mean that you need treatment. There may be conditions in which the blood pressure is different between your right and left arms. It is important to see your caregiver soon for a recheck. Most people have essential hypertension which means that there is not a specific cause. This type of high blood pressure may be lowered by changing lifestyle factors such as:  Stress.  Smoking.  Lack of exercise.  Excessive weight.  Drug/tobacco/alcohol use.  Eating less salt. Most people do not have symptoms from high blood pressure until it has caused damage to the body. Effective treatment can often prevent, delay or reduce that damage. TREATMENT  When a cause has been identified, treatment for high blood pressure is directed at the  cause. There are a large number of medications to treat HTN. These fall into several categories, and your caregiver will help you select the medicines that are best for you. Medications may have side effects. You should review side effects with your caregiver. If your blood pressure stays high after you have made lifestyle changes or started on medicines,   Your medication(s) may need to be changed.  Other problems may need to be addressed.  Be certain you understand your prescriptions, and know how and when to take your medicine.  Be sure to follow up with your caregiver within the time frame advised (usually within two weeks) to have your blood pressure rechecked and to review your medications.  If you are taking more than one medicine to lower your blood pressure, make sure you know how and at what times they should be taken. Taking two medicines at the same time can result in blood pressure that is too low. SEEK IMMEDIATE MEDICAL CARE IF:  You develop a severe headache, blurred or changing vision, or confusion.  You have unusual weakness or numbness, or a faint feeling.  You have severe chest or abdominal pain, vomiting, or breathing problems. MAKE SURE YOU:   Understand these instructions.  Will watch your condition.  Will get help right away if you are not doing well or get worse. Document Released: 09/23/2005 Document Revised: 12/16/2011 Document Reviewed: 05/13/2008 [facility] Patient Information 2014 [place], [place].   Candida Infection, Adult A candida infection (also called yeast, fungus and Monilia infection) is  an overgrowth of yeast that can occur anywhere on the body. A yeast infection commonly occurs in warm, moist body areas. Usually, the infection remains localized but can spread to become a systemic infection. A yeast infection may be a sign of a more severe disease such as diabetes, leukemia, or AIDS. A yeast infection can occur in both men and women. In women,  Candida vaginitis is a vaginal infection. It is one of the most common causes of vaginitis. Men usually do not have symptoms or know they have an infection until other problems develop. Men may find out they have a yeast infection because their sex partner has a yeast infection. Uncircumcised men are more likely to get a yeast infection than circumcised men. This is because the uncircumcised glans is not exposed to air and does not remain as dry as that of a circumcised glans. Older adults may develop yeast infections around dentures. CAUSES  Women  Antibiotics.  Steroid medication taken for a long time.  Being overweight (obese).  Diabetes.  Poor immune condition.  Certain serious medical conditions.  Immune suppressive medications for organ transplant patients.  Chemotherapy.  Pregnancy.  Menstration.  Stress and fatigue.  Intravenous drug use.  Oral contraceptives.  Wearing tight-fitting clothes in the crotch area.  Catching it from a sex partner who has a yeast infection.  Spermicide.  Intravenous, urinary, or other catheters. Men  Catching it from a sex partner who has a yeast infection.  Having oral or anal sex with a person who has the infection.  Spermicide.  Diabetes.  Antibiotics.  Poor immune system.  Medications that suppress the immune system.  Intravenous drug use.  Intravenous, urinary, or other catheters. SYMPTOMS  Women  Thick, white vaginal discharge.  Vaginal itching.  Redness and swelling in and around the vagina.  Irritation of the lips of the vagina and perineum.  Blisters on the vaginal lips and perineum.  Painful sexual intercourse.  Low blood sugar (hypoglycemia).  Painful urination.  Bladder infections.  Intestinal problems such as constipation, indigestion, bad breath, bloating, increase in gas, diarrhea, or loose stools. Men  Men may develop intestinal problems such as constipation, indigestion, bad breath,  bloating, increase in gas, diarrhea, or loose stools.  Dry, cracked skin on the penis with itching or discomfort.  Jock itch.  Dry, flaky skin.  Athlete's foot.  Hypoglycemia. DIAGNOSIS  Women  A history and an exam are performed.  The discharge may be examined under a microscope.  A culture may be taken of the discharge. Men  A history and an exam are performed.  Any discharge from the penis or areas of cracked skin will be looked at under the microscope and cultured.  Stool samples may be cultured. TREATMENT  Women  Vaginal antifungal suppositories and creams.  Medicated creams to decrease irritation and itching on the outside of the vagina.  Warm compresses to the perineal area to decrease swelling and discomfort.  Oral antifungal medications.  Medicated vaginal suppositories or cream for repeated or recurrent infections.  Wash and dry the irritation areas before applying the cream.  Eating yogurt with lactobacillus may help with prevention and treatment.  Sometimes painting the vagina with gentian violet solution may help if creams and suppositories do not work. Men  Antifungal creams and oral antifungal medications.  Sometimes treatment must continue for 30 days after the symptoms go away to prevent recurrence. HOME CARE INSTRUCTIONS  Women  Use cotton underwear and avoid tight-fitting clothing.  Avoid  colored, scented toilet paper and deodorant tampons or pads.  Do not douche.  Keep your diabetes under control.  Finish all the prescribed medications.  Keep your skin clean and dry.  Consume milk or yogurt with lactobacillus active culture regularly. If you get frequent yeast infections and think that is what the infection is, there are over-the-counter medications that you can get. If the infection does not show healing in 3 days, talk to your caregiver.  Tell your sex partner you have a yeast infection. Your partner may need treatment also,  especially if your infection does not clear up or recurs. Men  Keep your skin clean and dry.  Keep your diabetes under control.  Finish all prescribed medications.  Tell your sex partner that you have a yeast infection so they can be treated if necessary. SEEK MEDICAL CARE IF:   Your symptoms do not clear up or worsen in one week after treatment.  You have an oral temperature above 102 F (38.9 C).  You have trouble swallowing or eating for a prolonged time.  You develop blisters on and around your vagina.  You develop vaginal bleeding and it is not your menstrual period.  You develop abdominal pain.  You develop intestinal problems as mentioned above.  You get weak or lightheaded.  You have painful or increased urination.  You have pain during sexual intercourse. MAKE SURE YOU:   Understand these instructions.  Will watch your condition.  Will get help right away if you are not doing well or get worse. Document Released: 10/31/2004 Document Revised: 12/16/2011 Document Reviewed: 02/12/2010 [facility] Patient Information 2014 [place], [place].    Emergency Department Resource Guide 1) Find a Doctor and Pay Out of Pocket Although you won't have to find out who is covered by your insurance plan, it is a good idea to ask around and get recommendations. You will then need to call the office and see if the doctor you have chosen will accept you as a new patient and what types of options they offer for patients who are self-pay. Some doctors offer discounts or will set up payment plans for their patients who do not have insurance, but you will need to ask so you aren't surprised when you get to your appointment.  2) Contact Your Local Health Department Not all health departments have doctors that can see patients for sick visits, but many do, so it is worth a call to see if yours does. If you don't know where your local health department is, you can check in your phone book.  The CDC also has a tool to help you locate your state's health department, and many state websites also have listings of all of their local health departments.  3) Find a Walk-in Clinic If your illness is not likely to be very severe or complicated, you may want to try a walk in clinic. These are popping up all over the country in pharmacies, drugstores, and shopping centers. They're usually staffed by nurse practitioners or physician assistants that have been trained to treat common illnesses and complaints. They're usually fairly quick and inexpensive. However, if you have serious medical issues or chronic medical problems, these are probably not your best option.  No Primary Care Doctor: - Call Health Connect at  [phone] - they can help you locate a primary care doctor that  accepts your insurance, provides certain services, etc. - Physician Referral Service- [phone]  Chronic Pain Problems: Organization  Address  Phone   Notes  [facility]  [phone] Patients need to be referred by their primary care doctor.   Medication Assistance: Organization         Address  Phone   Notes  [facility] Medication [facility] [address] [place], [place] [address] [phone] --Must be a resident of [facility] -- Must have NO insurance coverage whatsoever (no Medicaid/ Medicare, etc.) -- The pt. MUST have a primary care doctor that directs their care regularly and follows them in the community   MedAssist  [phone]) [phone]   [employer]  [phone]    Agencies that provide inexpensive medical care: Organization         Address  Phone   Notes  [facility]  [phone]   [facility]    [phone]   [facility] [address] [place], [place] [address] [phone]   [facility] [address], [place] [phone]) [phone]   [facility]     [phone](336) [phone]   [facility]    [phone](336) [phone]   [facility] and [facility]  [address], [place] Phone:  [phone], Fax:  [phone] Hours of Operation:  9 am - 6 pm, M-F.  Also accepts Medicaid/Medicare and self-pay.  [facility]  [address], [place] Phone: [phone]) [phone], Fax: [phone]. Hours of Operation:  8:30 am - 5:30 pm, M-F.  Also accepts Medicaid and self-pay.  [facility] [place] [address], [place] Phone: [phone]) [phone]   [facility] [address], [place], [place] [phone], Ext. [phone] Mondays & Thursdays: 7-9 AM.  First 15 patients are seen on a first come, first serve basis.    Medicaid-accepting [facility] [place] Providers:  Organization         Address  Phone   Notes  [facility] [address], [place] [phone]) [phone] Also accepts self-pay patients.  [facility] [address], [place]  [phone]) [phone]   [facility] [address], [place] [phone]   [facility] [address] [place] [address], [place] [phone]   Renaye RakersVeita Bland [address], [place]   [phone] Only accepts [place] Access [place] patients after they have their name applied to their card.   Self-Pay (no insurance) in [facility]:  Organization         Address  Phone   Notes  Sickle Cell Patients, [facility] [address], [place] [phone]) [phone]   [facility] [address], [place] [phone]   [facility] [place]  [address], [place] [phone]) [phone]   [facility]/Dr. Osei-Bonsu  [address] [place] [address], [place] or [phone], [place] [phone] Phone number for both [place] and [place] locations is the same.  [facility] and  [facility] [address], [place] [phone]   [facility] [place] [address] [place] [address], [place] or [address] [phone]   [facility] [address], [place] [phone]) [phone], phone; [phone], fax Sees patients 1st and 3rd Saturday of every month.  Must not qualify  for public or private insurance (i.e. Medicaid, Medicare, [place] Health Choice, Veterans' Benefits)  Household income should be no more than 200% of the poverty level The clinic cannot treat you if you are pregnant or think you are pregnant  Sexually transmitted diseases are not treated at the clinic.    Dental Care: Organization         Address  Phone  Notes  [facility] of [facility] [address] [place], [place] [phone] Accepts children up to age 35 who are enrolled in [place] or [place] Health Choice; pregnant women with a Medicaid card; and children who have applied for Medicaid or [place] Health Choice, but were declined, whose parents can pay a reduced fee at time of service.  [facility] of [facility]  [address], [place] [phone] Accepts children up to age 74 who are enrolled in [place] or [place] Health Choice; pregnant women with a Medicaid card; and children who have applied for Medicaid or [place] Health Choice, but were declined, whose parents can pay a reduced fee at time of service.  [facility]  [address] [place], [place] [phone] Patients are seen by appointment only. Walk-ins are not accepted. [facility] will see patients 36 years of age and older. Monday - Tuesday (8am-5pm) Most Wednesdays (8:30-5pm) $30 per visit, cash only  [facility] Access PRO[facility]  [address], [facility] [phone] Patients are seen by appointment only. Walk-ins are not accepted. [facility] will see patients 31 years of age and  older. One Wednesday Evening (Monthly: Volunteer Based).  $30 per visit, cash only  [employer]  [phone] for adults; Children under age 38, call Graduate Pediatric Dentistry at [phone]. Children aged 87-14, please call [phone] to request a pediatric application.  Dental services are provided in all areas of dental care including fillings, crowns and bridges, complete and partial dentures, implants, gum treatment, root canals, and extractions. Preventive care is also provided. Treatment is provided to both adults and children. Patients are selected via a lottery and there is often a waiting list.   [facility] [address], [place]  [phone] [email or web address]   [facility] [address] [place], [place] [phone], Ext. [phone] Second and Fourth Thursday of each month, opens at 6:30 AM; Clinic ends at 9 AM.  Patients are seen on a first-come first-served basis, and a limited number are seen during each clinic.   [facility]  [address] [place] [address] [place], [place] [phone]   Eligibility Requirements You must have lived in [place], [place], or [place] counties for at least the last three months.   You cannot be eligible for state or federal sponsored [place], including [employer], [place], or [employer].   You generally cannot be eligible for healthcare insurance through your employer.    How to apply: Eligibility screenings are held every Tuesday and Wednesday afternoon from 1:00 pm until 4:00 pm. You do not need an appointment for the interview!  [facility] [address] [place], [place] [phone]   [facility] Health Department  [phone]   [facility] Health Department  [phone]   [facility] Health Department  [phone]    Behavioral Health Resources in the Community: Intensive Outpatient Programs Organization          Address  Phone  Notes  [facility] [address] [place], [place] [phone]   [facility] Outpatient [address] [place], [place] [phone]   [facility]: [facility] [address] [place], [place]  [phone]   [facility] Mental Health [address]  [place], [place] [phone] or [phone]   Substance Abuse Resources Organization         Address  Phone  Notes  Alcohol and Drug Services  [phone]   [facility]  [phone]   [facility] [place]  [phone]   Floydene Flock  [phone]   Residential & Outpatient Substance Abuse Program  [phone]   Psychological Services Organization         Address  Phone  Notes  [facility] Behavioral Health  [phone]   [facility]  [phone]   [facility] Mental Health [address], [place] [phone] or [phone]    Mobile Crisis Teams Organization         Address  Phone  Notes  [facility], Mobile Crisis Care Unit  [phone]   [facility]  [address] [place], [place] [phone]   Doristine Locks [address] [place] [phone]    Self-Help/Support Groups Organization         Address  Phone             Notes  [facility]oc[facility] of [place] - variety of support groups  336- I7437963 Call for more information  Narcotics Anonymous (NA), [facility] [address], [employer] [place]  2 meetings at this location   Statistician         Address  Phone  Notes  [facility] [address],    [place]  [phone]   [facility]  [address] [place] [address]242, [place], [place] [phone]   [facility] [address] [place], [place] [phone] Admissions: 8am-3pm M-F  [facility] [address]    [place], [place] [phone]   [facility] [address] [place], [place], [place] [phone]   [facility] [address],  [place], [place] [phone]   [facility] - Intensive Outpatient [address], [place], [place] [phone]   [facility] [address] [place]  [place], [place] [phone] or [phone]   [facility] ([facility]) [address], [place], [place] [phone] Accepts Medicaid  [facility] [place] [address]  [place] [phone] Substance Abuse/Addiction Treatment   [facility] Organization         Address  Phone  Notes  [facility]  [phone]   [name], PhD [address] Kn[address] [place], [place]   [phone] or [phone]   [facility] Behavioral   [address] [place], [place] [phone]   [facility] [address] [place], [place] [phone] Insurance/Medicaid/sponsorship through [facility] and Families [address]                                    [place], [place] [phone] Therapy/tele-psych/case  [facility] [address], [place] [phone]    Dr. Lolly Mustache  [phone]   [facility]. 1) [address], [place] °2) [address], [place] °3)  [address], [place] [phone] °([phone] ° °([phone]   °[place] Child Abuse Hotline (336) 342-1394 or ([phone] (After Hours)    ° ° ° ° °

## 2013-12-31 NOTE — ED Provider Notes (Signed)
I saw and evaluated the patient, reviewed the resident's note and I agree with the findings and plan.   EKG Interpretation   Date/Time:  Friday December 31 2013 14:06:32 EDT Ventricular Rate:  89 PR Interval:  168 QRS Duration: 82 QT Interval:  362 QTC Calculation: 440 R Axis:   18 Text Interpretation:  Normal sinus rhythm Right atrial enlargement Septal  infarct , age undetermined Nonspecific ST and T wave abnormality NO  SIGNIFICANT CHANGE SINCE LAST TRACING YESTERDAY Confirmed by Amalea Ottey  MD,  Sary Bogie [phone]([id number]) on 12/31/2013 3:56:48 PM      Patient with multiple complaints due to medication noncompliance and no primary care. Hypertensive, but asymptomatic. Has chronic cough, no respiratory distress or acute findings. Concern over presence of thrush possibly indicating HIV but patient declines testing. He understands the significance of this. Will refer to primary care.  Gilda Creasehristopher J. Milka Windholz, MD 12/31/13 [phone]

## 2013-12-31 NOTE — ED Notes (Signed)
PT reports lightheadedness appx 4 hours ago; "I normally feel like this when my BP is high". Hx HTN, states he has been out of RX for appx 3 months. Neuro intact. No weakness noted. Denies CP, HA, SOB.

## 2013-12-31 NOTE — ED Provider Notes (Signed)
CSN: [id number]     Arrival date & time 12/31/13  1359 History   First MD Initiated Contact with Patient 12/31/13 1532     Chief Complaint  Patient presents with  . Hypertension  . Dizziness     (Consider location/radiation/quality/duration/timing/severity/associated sxs/prior Treatment) Patient is a 48 y.o. male presenting with dizziness and near-syncope. The history is provided by the patient.  Dizziness Associated symptoms: no chest pain, no diarrhea, no nausea, no shortness of breath and no vomiting   Near Syncope This is a recurrent problem. The current episode started more than 1 month ago. The problem occurs intermittently. The problem has been resolved. Associated symptoms include coughing (states has had chronic cough for 2 years, non productive) and a sore throat. Pertinent negatives include no abdominal pain, chest pain, congestion, diaphoresis, fever, nausea, numbness, rash, vomiting or weakness. Exacerbated by: states when his BP is high, he feels dizzy, non vertiginous. He has tried nothing for the symptoms. The treatment provided no relief.    Past Medical History  Diagnosis Date  . Hypertension   . Chest pain     a. 07/2012 Lexiscan Sestamibi:  no ischemia/infarct, nl EF.  . Tobacco abuse    Past Surgical History  Procedure Laterality Date  . Wrist fracture surgery  ~ 2009    right; "put a plate in it" (40/98/119110/24/2013)   Family History  Problem Relation Age of Onset  . Other      No known heart disease   History  Substance Use Topics  . Smoking status: Current Every Day Smoker -- 1.00 packs/day for 26 years    Types: Cigarettes  . Smokeless tobacco: Never Used  . Alcohol Use: 7.2 oz/week    12 Cans of beer per week     Comment: 07/30/2012 "1-2 beers/day; maybe 12/wk"    Review of Systems  Constitutional: Negative for fever, diaphoresis, activity change and appetite change.  HENT: Positive for sore throat. Negative for congestion and rhinorrhea.   Eyes:  Negative for discharge and itching.  Respiratory: Positive for cough (states has had chronic cough for 2 years, non productive). Negative for shortness of breath and wheezing.   Cardiovascular: Positive for near-syncope. Negative for chest pain.  Gastrointestinal: Negative for nausea, vomiting, abdominal pain, diarrhea and constipation.  Genitourinary: Negative for hematuria, decreased urine volume and difficulty urinating.  Skin: Negative for rash and wound.  Neurological: Positive for dizziness. Negative for syncope, weakness and numbness.  All other systems reviewed and are negative.      Allergies  Review of patient's allergies indicates no known allergies.  Home Medications   Current Outpatient Rx  Name  Route  Sig  Dispense  Refill  . carvedilol (COREG) 6.25 MG tablet   Oral   Take 1 tablet (6.25 mg total) by mouth 2 (two) times daily with a meal.   60 tablet   0   . traMADol (ULTRAM) 50 MG tablet   Oral   Take 50 mg by mouth every 6 (six) hours as needed for pain.         . hydrochlorothiazide (HYDRODIURIL) 12.5 MG tablet   Oral   Take 1 tablet (12.5 mg total) by mouth daily.   14 tablet   0   . nystatin (MYCOSTATIN) 100000 UNIT/ML suspension   Oral   Take 5 mLs (500,000 Units total) by mouth 4 (four) times daily. Swish the medicine around your mouth before swallowing.   140 mL   0  BP 172/116  Pulse 92  Resp 18  Ht 6' (1.829 m)  SpO2 100% Physical Exam  Vitals reviewed. Constitutional: He is oriented to person, place, and time. He appears well-developed and well-nourished. No distress.  NAD, well appearing  HENT:  Head: Normocephalic and atraumatic.  Mouth/Throat: Oropharyngeal exudate (thrush in posterior oropharyxn) present.  Eyes: Conjunctivae and EOM are normal. Pupils are equal, round, and reactive to light. Right eye exhibits no discharge. Left eye exhibits no discharge. No scleral icterus.  Neck: Normal range of motion. Neck supple.   Cardiovascular: Normal rate, regular rhythm, normal heart sounds and intact distal pulses.  Exam reveals no gallop and no friction rub.   No murmur heard. Pulmonary/Chest: Effort normal and breath sounds normal. No respiratory distress. He has no wheezes. He has no rales.  Abdominal: Soft. He exhibits no distension and no mass. There is no tenderness.  Musculoskeletal: Normal range of motion.  Neurological: He is alert and oriented to person, place, and time. No cranial nerve deficit. He exhibits normal muscle tone. Coordination normal.  5/5 strength in all exts, normal sensation in all exts, 2+ DTRs in patella and brachioradilias b/l, ambulatory without ataxia, CNs II-XII intact  Skin: Skin is warm. No rash noted. He is not diaphoretic.    ED Course  Procedures (including critical care time) Labs Review Labs Reviewed - No data to display Imaging Review No results found.   EKG Interpretation   Date/Time:  Friday December 31 2013 14:06:32 EDT Ventricular Rate:  89 PR Interval:  168 QRS Duration: 82 QT Interval:  362 QTC Calculation: 440 R Axis:   18 Text Interpretation:  Normal sinus rhythm Right atrial enlargement Septal  infarct , age undetermined Nonspecific ST and T wave abnormality NO  SIGNIFICANT CHANGE SINCE LAST TRACING YESTERDAY Confirmed by Blinda Leatherwood  MD,  CHRISTOPHER [phone]) on 12/31/2013 3:56:48 PM      MDM   MDM: 48 y.o. AAM w/ PMHx of HTN, w/ multiple ccs. Pt with cc: of dizziness. Non vertiginous, intermittent, has none currently. States he normally has it when he feels his BP is elevated. Denies HA, chest pain, SOB, abd pain recently or currently. Does not have a PCP, states that he gets his BP meds from EDs and has been out for past 3 months. BP elevated in triage, in room 177/85. Pt with normal neuro exam, no signs of end organ damage. Pt also complains of a few months of sore throat and white patches in throat. Sore, but no swelling or trismus. No fevers. Has thrush  on exam. Also has mild cough for past 2 years that hasn't changed, suspect from smoking. Unlikely PNA as chronic, no fever, lungs clear, not productive. D/w patient about testing for HIV and further blood work as thrush in adults could be first sign of HIV/AIDS. Pt refused testing and other blood work just wanting medicine to treat it. D/w him risks of not identifying HIV and he still refused. Will treat with Nystatin and will start on HCTZ. Give resources to follow up with PCP as he needs long term follow up. Care of case d/w my attending.  Final diagnoses:  Thrush  Hypertension    Discharged  Pilar Jarvis, MD 12/31/13 [phone]

## 2014-06-27 IMAGING — CR DG FOREARM 2V*L*
2 series · 2 of 2 positions shown · non-contrast
Comparison: None.

CLINICAL DATA: Pain and swelling in the left wrist after fall
yesterday.

LEFT FOREARM - 2 VIEW

[x forearm ap left *]
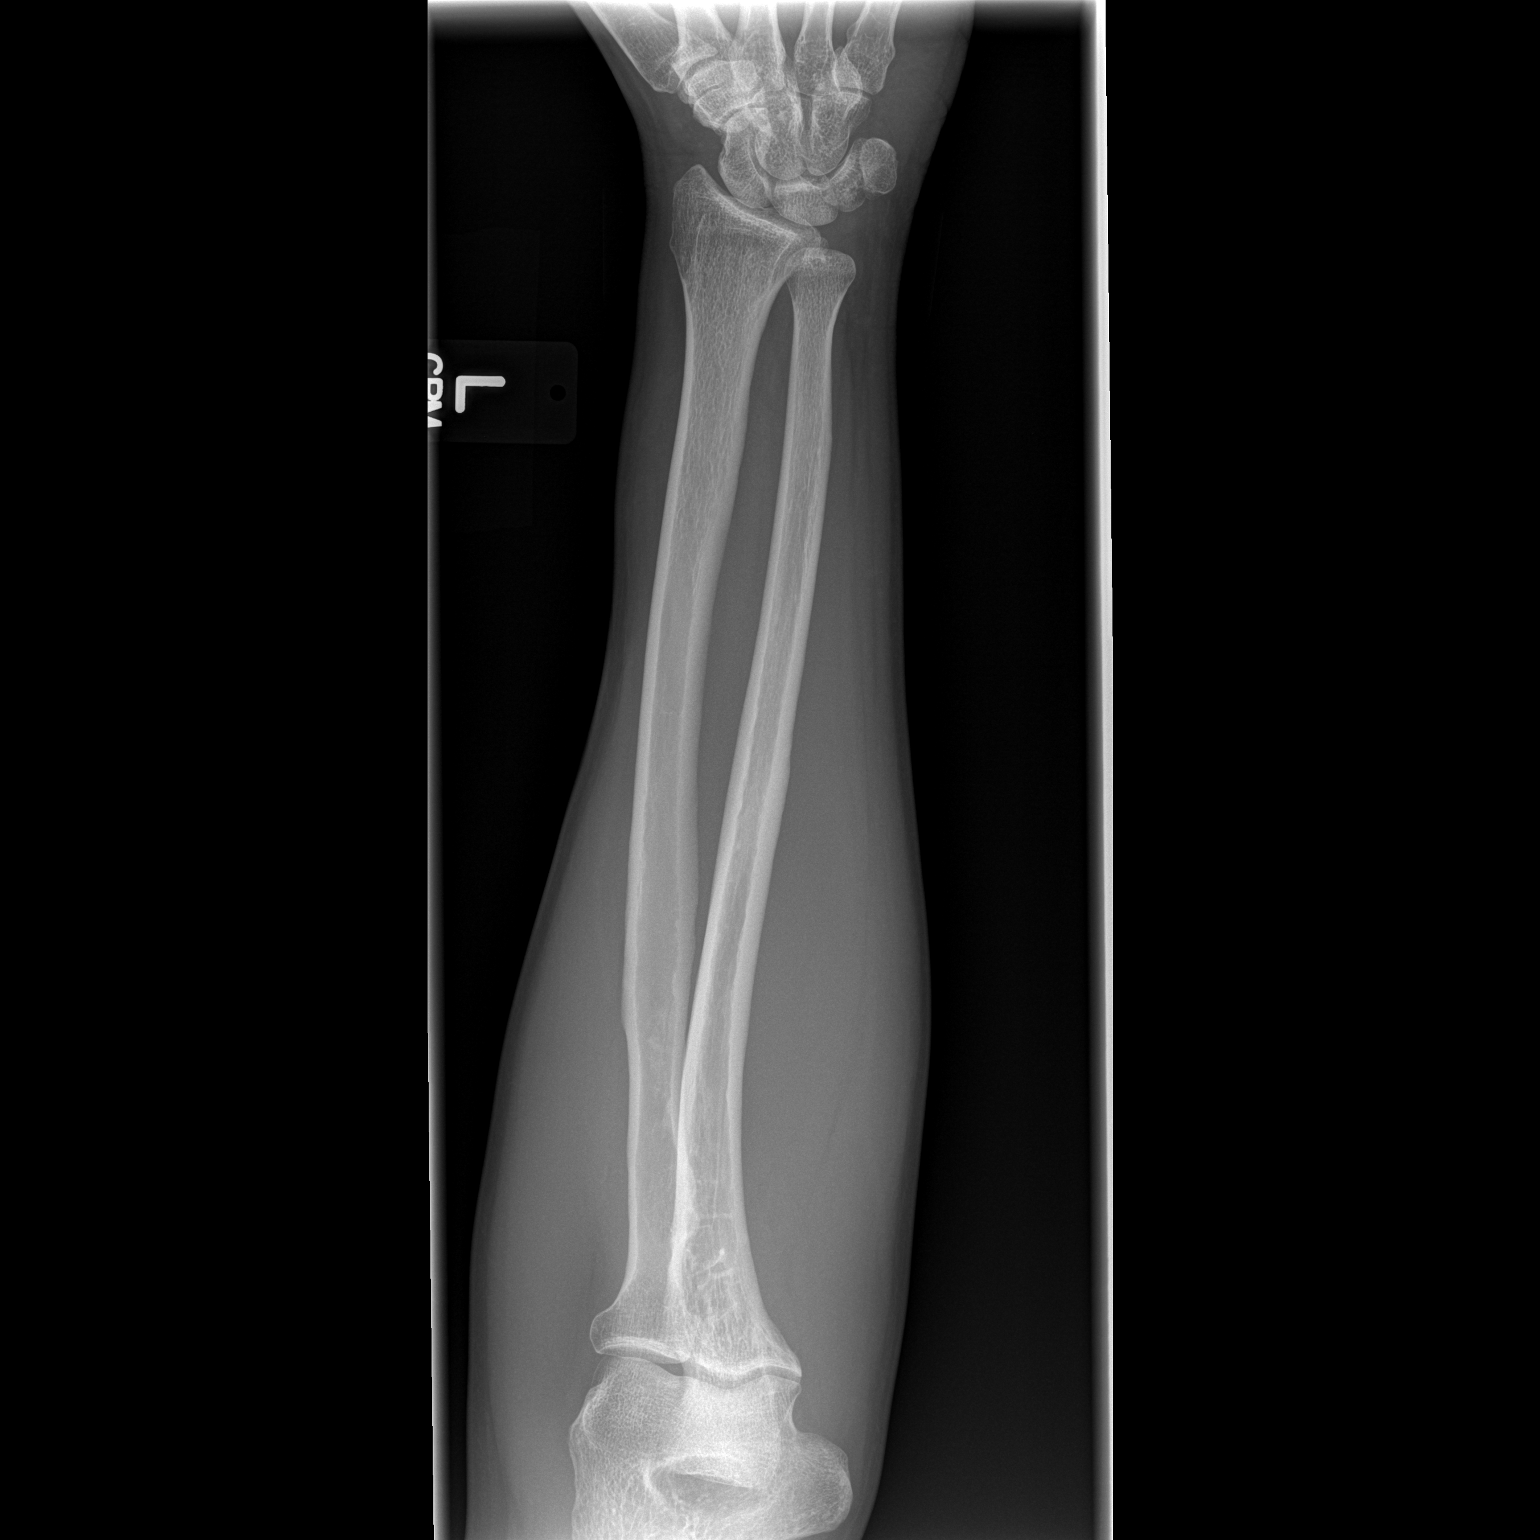

[x forearm lat left *]
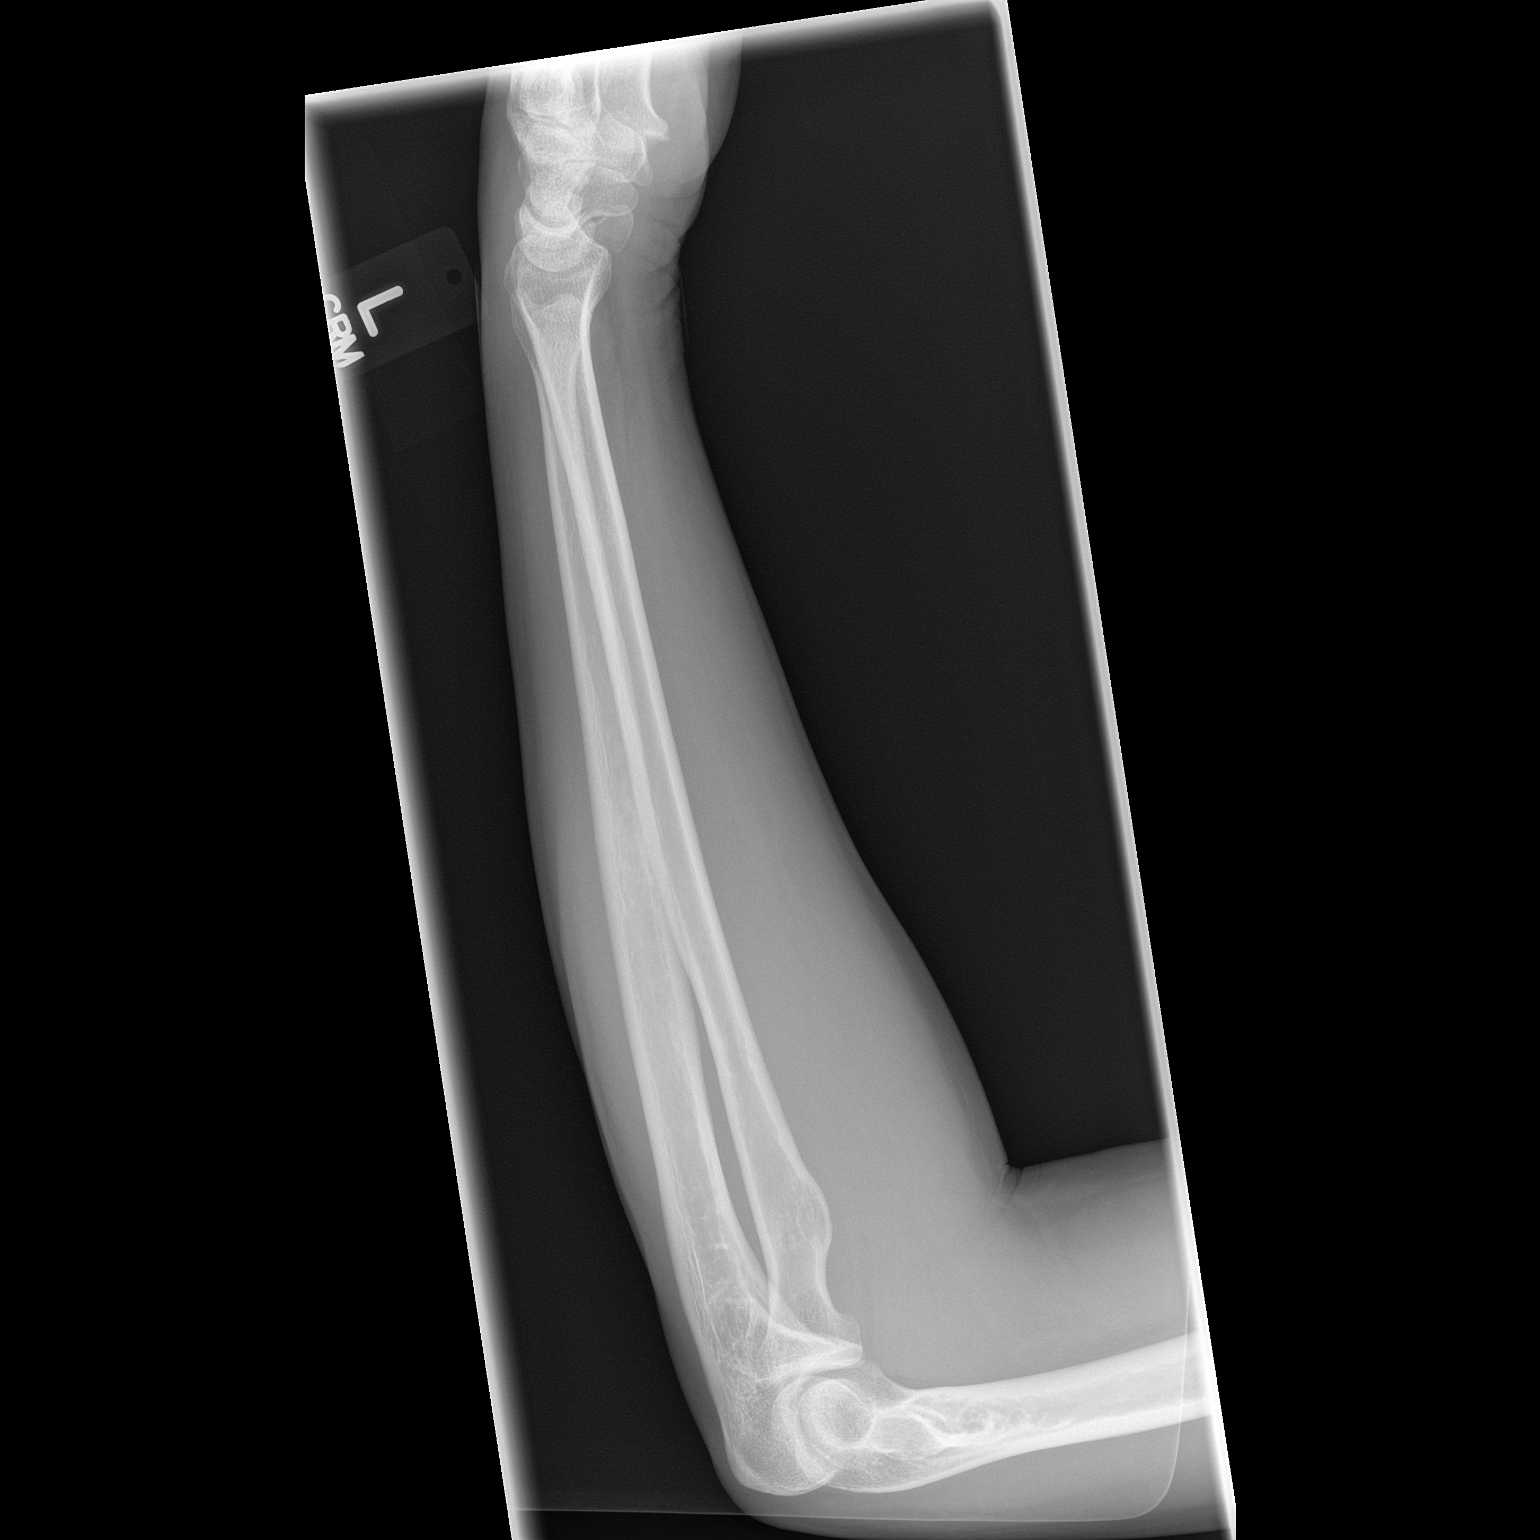

[2 of 2 positions shown; findings below may reference images not displayed]

FINDINGS: The left radius and ulna appear intact. No evidence of
acute fracture or subluxation.  No focal bone lesions.  Bone matrix
and cortex appear intact.  No abnormal radiopaque densities in the
soft tissues.
IMPRESSION: No acute bony abnormalities.

## 2014-08-08 ENCOUNTER — Emergency Department (HOSPITAL_COMMUNITY)
Admission: EM | Admit: 2014-08-08 | Discharge: 2014-08-08 | Disposition: A | Discharge status: Home / Self Care | Payer: BC Managed Care – PPO | Attending: Emergency Medicine | Admitting: Emergency Medicine

## 2014-08-08 ENCOUNTER — Encounter (HOSPITAL_COMMUNITY): Payer: Self-pay | Admitting: Emergency Medicine

## 2014-08-08 DIAGNOSIS — H81399 Other peripheral vertigo, unspecified ear: Secondary | ICD-10-CM | POA: Insufficient documentation

## 2014-08-08 DIAGNOSIS — I10 Essential (primary) hypertension: Secondary | ICD-10-CM | POA: Insufficient documentation

## 2014-08-08 DIAGNOSIS — Z79899 Other long term (current) drug therapy: Secondary | ICD-10-CM | POA: Insufficient documentation

## 2014-08-08 DIAGNOSIS — Z720 Tobacco use: Secondary | ICD-10-CM | POA: Insufficient documentation

## 2014-08-08 LAB — CBC WITH DIFFERENTIAL/PLATELET
Basophils Absolute: 0 10*3/uL (ref 0.0–0.1)
Basophils Relative: 1 % (ref 0–1)
EOS ABS: 0.1 10*3/uL (ref 0.0–0.7)
Eosinophils Relative: 4 % (ref 0–5)
HCT: 42.9 % (ref 39.0–52.0)
Hemoglobin: 14.8 g/dL (ref 13.0–17.0)
Lymphocytes Relative: 44 % (ref 12–46)
Lymphs Abs: 1.6 10*3/uL (ref 0.7–4.0)
MCH: 30 pg (ref 26.0–34.0)
MCHC: 34.5 g/dL (ref 30.0–36.0)
MCV: 87 fL (ref 78.0–100.0)
MONO ABS: 0.5 10*3/uL (ref 0.1–1.0)
MONOS PCT: 14 % — AB (ref 3–12)
Neutro Abs: 1.3 10*3/uL — ABNORMAL LOW (ref 1.7–7.7)
Neutrophils Relative %: 37 % — ABNORMAL LOW (ref 43–77)
PLATELETS: 208 10*3/uL (ref 150–400)
RBC: 4.93 MIL/uL (ref 4.22–5.81)
RDW: 14.3 % (ref 11.5–15.5)
WBC: 3.5 10*3/uL — ABNORMAL LOW (ref 4.0–10.5)

## 2014-08-08 LAB — COMPREHENSIVE METABOLIC PANEL
ALT: 18 U/L (ref 0–53)
ANION GAP: 13 (ref 5–15)
AST: 34 U/L (ref 0–37)
Albumin: 3 g/dL — ABNORMAL LOW (ref 3.5–5.2)
Alkaline Phosphatase: 107 U/L (ref 39–117)
BUN: 8 mg/dL (ref 6–23)
CALCIUM: 8.7 mg/dL (ref 8.4–10.5)
CO2: 24 mEq/L (ref 19–32)
CREATININE: 0.75 mg/dL (ref 0.50–1.35)
Chloride: 103 mEq/L (ref 96–112)
GLUCOSE: 138 mg/dL — AB (ref 70–99)
Potassium: 3.9 mEq/L (ref 3.7–5.3)
Sodium: 140 mEq/L (ref 137–147)
TOTAL PROTEIN: 8.5 g/dL — AB (ref 6.0–8.3)
Total Bilirubin: 0.3 mg/dL (ref 0.3–1.2)

## 2014-08-08 LAB — ETHANOL: Alcohol, Ethyl (B): 11 mg/dL (ref 0–11)

## 2014-08-08 MED ORDER — SODIUM CHLORIDE 0.9 % IV SOLN
1000.0000 mL | INTRAVENOUS | Status: DC
  Administered 2014-08-08: 1000 mL via INTRAVENOUS

## 2014-08-08 MED ORDER — HYDROCHLOROTHIAZIDE 12.5 MG PO TABS: 12.5000 mg | ORAL_TABLET | Freq: Every day | ORAL | Status: DC

## 2014-08-08 MED ORDER — CARVEDILOL 6.25 MG PO TABS: 6.2500 mg | ORAL_TABLET | Freq: Two times a day (BID) | ORAL | Status: DC

## 2014-08-08 MED ORDER — SODIUM CHLORIDE 0.9 % IV SOLN
1000.0000 mL | Freq: Once | INTRAVENOUS | Status: AC
  Administered 2014-08-08: 1000 mL via INTRAVENOUS

## 2014-08-08 MED ORDER — ONDANSETRON HCL 4 MG/2ML IJ SOLN
4.0000 mg | Freq: Once | INTRAMUSCULAR | Status: AC
  Administered 2014-08-08: 4 mg via INTRAVENOUS
  Filled 2014-08-08: qty 2

## 2014-08-08 MED ORDER — AMLODIPINE BESYLATE 5 MG PO TABS: 5.0000 mg | ORAL_TABLET | Freq: Every day | ORAL | Status: DC

## 2014-08-08 MED ORDER — MECLIZINE HCL 25 MG PO TABS
25.0000 mg | ORAL_TABLET | Freq: Once | ORAL | Status: AC
  Administered 2014-08-08: 25 mg via ORAL
  Filled 2014-08-08: qty 1

## 2014-08-08 MED ORDER — MECLIZINE HCL 25 MG PO TABS: 25.0000 mg | ORAL_TABLET | Freq: Three times a day (TID) | ORAL | Status: DC | PRN

## 2014-08-08 NOTE — ED Notes (Signed)
Pt. States he has #4 40 oz beers and 1 quart of liquor.

## 2014-08-08 NOTE — ED Notes (Signed)
Pt. States that he woke up shaking feeling dizzy with a feeling like "he was going to die." Denies pain. Denies nausea. Has Hx. Of HTN. But has not taken his meds in a long time.

## 2014-08-08 NOTE — ED Provider Notes (Signed)
CSN: [id number]     Arrival date & time 08/08/14  0403 History   First MD Initiated Contact with Patient 08/08/14 [phone]     Chief Complaint  Patient presents with  . Dizziness     (Consider location/radiation/quality/duration/timing/severity/associated sxs/prior Treatment) Patient is a 48 y.o. male presenting with dizziness. The history is provided by the patient.  Dizziness He states that he woke up at about 2 AM feeling dizzy like things were spinning with associated nausea. He denies vomiting. He denies chest pain, heaviness, tightness, pressure. He had been drinking heavily during the evening admitting to 160 ounces of clear and cup of hard liquor. He states he will drink similar quantities on average a couple times a week. He does have history of hypertension and states he has never taken his medications. He had been admitted to the hospital 2 years ago and given prescriptions which she never got filled. He denies headache, tinnitus, ear pain. He has not treated himself with anything.  Past Medical History  Diagnosis Date  . Hypertension   . Chest pain     a. 07/2012 Lexiscan Sestamibi:  no ischemia/infarct, nl EF.  . Tobacco abuse    Past Surgical History  Procedure Laterality Date  . Wrist fracture surgery  ~ 2009    right; "put a plate in it" (11/91/478210/24/2013)   Family History  Problem Relation Age of Onset  . Other      No known heart disease   History  Substance Use Topics  . Smoking status: Current Every Day Smoker -- 1.00 packs/day for 26 years    Types: Cigarettes  . Smokeless tobacco: Never Used  . Alcohol Use: Yes     Comment: #2 40oz/ day    Review of Systems  Neurological: Positive for dizziness.  All other systems reviewed and are negative.     Allergies  Review of patient's allergies indicates no known allergies.  Home Medications   Prior to Admission medications   Medication Sig Start Date End Date Taking? Authorizing Provider  carvedilol (COREG)  6.25 MG tablet Take 1 tablet (6.25 mg total) by mouth 2 (two) times daily with a meal. 04/03/13   Antony MaduraKelly Humes, PA-C  hydrochlorothiazide (HYDRODIURIL) 12.5 MG tablet Take 1 tablet (12.5 mg total) by mouth daily. 12/31/13   Pilar Jarvisoug Brtalik, MD  traMADol (ULTRAM) 50 MG tablet Take 50 mg by mouth every 6 (six) hours as needed for pain.    Historical Provider, MD   BP 197/95 mmHg  Pulse 84  Temp(Src) 98.4 F (36.9 C) (Oral)  Resp 24  SpO2 100% Physical Exam  Nursing note and vitals reviewed.  48 year old male, resting comfortably and in no acute distress. Vital signs are significant for hypertension and tachypnea. Oxygen saturation is 100%, which is normal. Head is normocephalic and atraumatic. PERRLA, EOMI. Oropharynx is clear.there is no nystagmus. Neck is nontender and supple without adenopathy or JVD. Back is nontender and there is no CVA tenderness. Lungs are clear without rales, wheezes, or rhonchi. Chest is nontender. Heart has regular rate and rhythm without murmur. Abdomen is soft, flat, nontender without masses or hepatosplenomegaly and peristalsis is normoactive. Extremities have no cyanosis or edema, full range of motion is present. Skin is warm and dry without rash. Neurologic: Mental status is normal, cranial nerves are intact, there are no motor or sensory deficits. Dizziness is not reproduced by passive head movement.  ED Course  Procedures (including critical care time) Labs Review Results for orders  placed or performed during the hospital encounter of 08/08/14  CBC with Differential  Result Value Ref Range   WBC 3.5 (L) 4.0 - 10.5 K/uL   RBC 4.93 4.22 - 5.81 MIL/uL   Hemoglobin 14.8 13.0 - 17.0 g/dL   HCT 96.042.9 45.439.0 - 09.852.0 %   MCV 87.0 78.0 - 100.0 fL   MCH 30.0 26.0 - 34.0 pg   MCHC 34.5 30.0 - 36.0 g/dL   RDW 11.914.3 14.711.5 - 82.915.5 %   Platelets 208 150 - 400 K/uL   Neutrophils Relative % 37 (L) 43 - 77 %   Neutro Abs 1.3 (L) 1.7 - 7.7 K/uL   Lymphocytes Relative 44 12 -  46 %   Lymphs Abs 1.6 0.7 - 4.0 K/uL   Monocytes Relative 14 (H) 3 - 12 %   Monocytes Absolute 0.5 0.1 - 1.0 K/uL   Eosinophils Relative 4 0 - 5 %   Eosinophils Absolute 0.1 0.0 - 0.7 K/uL   Basophils Relative 1 0 - 1 %   Basophils Absolute 0.0 0.0 - 0.1 K/uL  Comprehensive metabolic panel  Result Value Ref Range   Sodium 140 137 - 147 mEq/L   Potassium 3.9 3.7 - 5.3 mEq/L   Chloride 103 96 - 112 mEq/L   CO2 24 19 - 32 mEq/L   Glucose, Bld 138 (H) 70 - 99 mg/dL   BUN 8 6 - 23 mg/dL   Creatinine, Ser 5.620.75 0.50 - 1.35 mg/dL   Calcium 8.7 8.4 - 13.010.5 mg/dL   Total Protein 8.5 (H) 6.0 - 8.3 g/dL   Albumin 3.0 (L) 3.5 - 5.2 g/dL   AST 34 0 - 37 U/L   ALT 18 0 - 53 U/L   Alkaline Phosphatase 107 39 - 117 U/L   Total Bilirubin 0.3 0.3 - 1.2 mg/dL   GFR calc non Af Amer >90 >90 mL/min   GFR calc Af Amer >90 >90 mL/min   Anion gap 13 5 - 15  Ethanol  Result Value Ref Range   Alcohol, Ethyl (B) 11 0 - 11 mg/dL    MDM   Final diagnoses:  Peripheral vertigo, unspecified laterality  Essential hypertension    Probable peripheral vertigo.hypertension. Old records are reviewed and he has a number of ED visits over the past several years with only one blood pressure reading that was normal. Some levels have been exceedingly high-even higher than today. He had been given prescriptions for blood pressure by a cardiologist when he had been admitted 2 years ago for chest pain. He will be given IV fluids, IV ondansetron, and oral meclizine and reassessed. Screening labs will be obtained.  He feels much better after meclizine and IV fluids. Laboratory workup is significant for blood sugar 138. Blood sugar is also mildly elevated when it was checked about 4 months ago. He is advised of this and told to monitor his blood sugar frequently so that if he does become diabetic, it is caught early. He is encouraged to get a blood pressure machine to check his blood pressure at home. He is given new  prescriptions for the medication that he never got filled previously which are carvedilol, amlodipine, and hydrochlorothiazide.  Dione Boozeavid Coti Burd, MD 08/08/14 [phone]

## 2014-08-08 NOTE — ED Notes (Signed)
Pt. Refused wheelchair 

## 2014-08-08 NOTE — Discharge Instructions (Signed)
Your blood pressure was very high today. As a matter of fact, almost every time you have been in the emergency department, your blood pressure has been very high. Untreated high blood pressure can lead to strokes, heart attacks, and kidney failure. Please get a blood pressure machine to monitor your blood pressure at home. Please get the prescriptions filled and take them as prescribed.  Also, your blood sugar was slightly elevated. This may mean you are pre-diabetic. You should have your blood sugar checked every few months to make sure it is not getting too high.  Vertigo Vertigo means you feel like you or your surroundings are moving when they are not. Vertigo can be dangerous if it occurs when you are at work, driving, or performing difficult activities.  CAUSES  Vertigo occurs when there is a conflict of signals sent to your brain from the visual and sensory systems in your body. There are many different causes of vertigo, including:  Infections, especially in the inner ear.  A bad reaction to a drug or misuse of alcohol and medicines.  Withdrawal from drugs or alcohol.  Rapidly changing positions, such as lying down or rolling over in bed.  A migraine headache.  Decreased blood flow to the brain.  Increased pressure in the brain from a head injury, infection, tumor, or bleeding. SYMPTOMS  You may feel as though the world is spinning around or you are falling to the ground. Because your balance is upset, vertigo can cause nausea and vomiting. You may have involuntary eye movements (nystagmus). DIAGNOSIS  Vertigo is usually diagnosed by physical exam. If the cause of your vertigo is unknown, your caregiver may perform imaging tests, such as an MRI scan (magnetic resonance imaging). TREATMENT  Most cases of vertigo resolve on their own, without treatment. Depending on the cause, your caregiver may prescribe certain medicines. If your vertigo is related to body position issues, your  caregiver may recommend movements or procedures to correct the problem. In rare cases, if your vertigo is caused by certain inner ear problems, you may need surgery. HOME CARE INSTRUCTIONS   Follow your caregiver's instructions.  Avoid driving.  Avoid operating heavy machinery.  Avoid performing any tasks that would be dangerous to you or others during a vertigo episode.  Tell your caregiver if you notice that certain medicines seem to be causing your vertigo. Some of the medicines used to treat vertigo episodes can actually make them worse in some people. SEEK IMMEDIATE MEDICAL CARE IF:   Your medicines do not relieve your vertigo or are making it worse.  You develop problems with talking, walking, weakness, or using your arms, hands, or legs.  You develop severe headaches.  Your nausea or vomiting continues or gets worse.  You develop visual changes.  A family member notices behavioral changes.  Your condition gets worse. MAKE SURE YOU:  Understand these instructions.  Will watch your condition.  Will get help right away if you are not doing well or get worse. Document Released: 07/03/2005 Document Revised: 12/16/2011 Document Reviewed: 04/11/2011 [facility] Patient Information 2015 [place], [place]. This information is not intended to replace advice given to you by your health care provider. Make sure you discuss any questions you have with your health care provider.  Hypertension Hypertension, commonly called high blood pressure, is when the force of blood pumping through your arteries is too strong. Your arteries are the blood vessels that carry blood from your heart throughout your body. A blood pressure reading  consists of a higher number over a lower number, such as 110/72. The higher number (systolic) is the pressure inside your arteries when your heart pumps. The lower number (diastolic) is the pressure inside your arteries when your heart relaxes. Ideally you want your  blood pressure below 120/80. Hypertension forces your heart to work harder to pump blood. Your arteries may become narrow or stiff. Having hypertension puts you at risk for heart disease, stroke, and other problems.  RISK FACTORS Some risk factors for high blood pressure are controllable. Others are not.  Risk factors you cannot control include:   Race. You may be at higher risk if you are African American.  Age. Risk increases with age.  Gender. Men are at higher risk than women before age 50 years. After age 50, women are at higher risk than men. Risk factors you can control include:  Not getting enough exercise or physical activity.  Being overweight.  Getting too much fat, sugar, calories, or salt in your diet.  Drinking too much alcohol. SIGNS AND SYMPTOMS Hypertension does not usually cause signs or symptoms. Extremely high blood pressure (hypertensive crisis) may cause headache, anxiety, shortness of breath, and nosebleed. DIAGNOSIS  To check if you have hypertension, your health care provider will measure your blood pressure while you are seated, with your arm held at the level of your heart. It should be measured at least twice using the same arm. Certain conditions can cause a difference in blood pressure between your right and left arms. A blood pressure reading that is higher than normal on one occasion does not mean that you need treatment. If one blood pressure reading is high, ask your health care provider about having it checked again. TREATMENT  Treating high blood pressure includes making lifestyle changes and possibly taking medicine. Living a healthy lifestyle can help lower high blood pressure. You may need to change some of your habits. Lifestyle changes may include:  Following the DASH diet. This diet is high in fruits, vegetables, and whole grains. It is low in salt, red meat, and added sugars.  Getting at least 2 hours of brisk physical activity every  week.  Losing weight if necessary.  Not smoking.  Limiting alcoholic beverages.  Learning ways to reduce stress. If lifestyle changes are not enough to get your blood pressure under control, your health care provider may prescribe medicine. You may need to take more than one. Work closely with your health care provider to understand the risks and benefits. HOME CARE INSTRUCTIONS  Have your blood pressure rechecked as directed by your health care provider.   Take medicines only as directed by your health care provider. Follow the directions carefully. Blood pressure medicines must be taken as prescribed. The medicine does not work as well when you skip doses. Skipping doses also puts you at risk for problems.   Do not smoke.   Monitor your blood pressure at home as directed by your health care provider. SEEK MEDICAL CARE IF:   You think you are having a reaction to medicines taken.  You have recurrent headaches or feel dizzy.  You have swelling in your ankles.  You have trouble with your vision. SEEK IMMEDIATE MEDICAL CARE IF:  You develop a severe headache or confusion.  You have unusual weakness, numbness, or feel faint.  You have severe chest or abdominal pain.  You vomit repeatedly.  You have trouble breathing. MAKE SURE YOU:   Understand these instructions.  Will watch your  condition.  Will get help right away if you are not doing well or get worse. Document Released: 09/23/2005 Document Revised: 02/07/2014 Document Reviewed: 07/16/2013 [facility] Patient Information 2015 [place], [place]. This information is not intended to replace advice given to you by your health care provider. Make sure you discuss any questions you have with your health care provider.  Meclizine tablets or capsules What is this medicine? MECLIZINE (MEK li zeen) is an antihistamine. It is used to prevent nausea, vomiting, or dizziness caused by motion sickness. It is also used to prevent  and treat vertigo (extreme dizziness or a feeling that you or your surroundings are tilting or spinning around). This medicine may be used for other purposes; ask your health care provider or pharmacist if you have questions. COMMON BRAND NAME(S): Antivert, Dramamine Less Drowsy, Medivert, Meni-D What should I tell my health care provider before I take this medicine? They need to know if you have any of these conditions: -asthma -glaucoma -prostate trouble -stomach problems -urinary problems -an unusual or allergic reaction to meclizine, other medicines, foods, dyes, or preservatives -pregnant or trying to get pregnant -breast-feeding How should I use this medicine? Take this medicine by mouth with a glass of water. Follow the directions on the prescription label. If you are using this medicine to prevent motion sickness, take the dose at least 1 hour before travel. If it upsets your stomach, take it with food or milk. Take your doses at regular intervals. Do not take your medicine more often than directed. Talk to your pediatrician regarding the use of this medicine in children. Special care may be needed. Overdosage: If you think you have taken too much of this medicine contact a poison control center or emergency room at once. NOTE: This medicine is only for you. Do not share this medicine with others. What if I miss a dose? If you miss a dose, take it as soon as you can. If it is almost time for your next dose, take only that dose. Do not take double or extra doses. What may interact with this medicine? -barbiturate medicines for inducing sleep or treating seizures -digoxin -medicines for anxiety or sleeping problems, like alprazolam, diazepam or temazepam -medicines for hay fever and other allergies -medicines for mental depression -medicines for movement abnormalities as in Parkinson's disease, or for stomach problems -medicines for pain -medicines that relax muscles This list may  not describe all possible interactions. Give your health care provider a list of all the medicines, herbs, non-prescription drugs, or dietary supplements you use. Also tell them if you smoke, drink alcohol, or use illegal drugs. Some items may interact with your medicine. What should I watch for while using this medicine? If you are taking this medicine on a regular schedule, visit your doctor or health care professional for regular checks on your progress. You may get dizzy, drowsy or have blurred vision. Do not drive, use machinery, or do anything that needs mental alertness until you know how this medicine affects you. Do not stand or sit up quickly, especially if you are an older patient. This reduces the risk of dizzy or fainting spells. Alcohol can increase possible dizziness. Avoid alcoholic drinks. Your mouth may get dry. Chewing sugarless gum or sucking hard candy, and drinking plenty of water may help. Contact your doctor if the problem does not go away or is severe. This medicine may cause dry eyes and blurred vision. If you wear contact lenses you may feel some discomfort. Lubricating  drops may help. See your eye doctor if the problem does not go away or is severe. What side effects may I notice from receiving this medicine? Side effects that you should report to your doctor or health care professional as soon as possible: -fainting spells -fast or irregular heartbeat Side effects that usually do not require medical attention (report to your doctor or health care professional if they continue or are bothersome): -constipation -difficulty passing urine -difficulty sleeping -headache -stomach upset This list may not describe all possible side effects. Call your doctor for medical advice about side effects. You may report side effects to FDA at 1-800-FDA-1088. Where should I keep my medicine? Keep out of the reach of children. Store at room temperature between 15 and 30 degrees C (59 and  86 degrees F). Keep container tightly closed. Throw away any unused medicine after the expiration date. NOTE: This sheet is a summary. It may not cover all possible information. If you have questions about this medicine, talk to your doctor, pharmacist, or health care provider.  2015, Elsevier/[employer] Standard. (2008-03-31 10:35:36)  Amlodipine tablets What is this medicine? AMLODIPINE (am LOE di peen) is a calcium-channel blocker. It affects the amount of calcium found in your heart and muscle cells. This relaxes your blood vessels, which can reduce the amount of work the heart has to do. This medicine is used to lower high blood pressure. It is also used to prevent chest pain. This medicine may be used for other purposes; ask your health care provider or pharmacist if you have questions. COMMON BRAND NAME(S): Norvasc What should I tell my health care provider before I take this medicine? They need to know if you have any of these conditions: -heart problems like heart failure or aortic stenosis -liver disease -an unusual or allergic reaction to amlodipine, other medicines, foods, dyes, or preservatives -pregnant or trying to get pregnant -breast-feeding How should I use this medicine? Take this medicine by mouth with a glass of water. Follow the directions on the prescription label. Take your medicine at regular intervals. Do not take more medicine than directed. Talk to your pediatrician regarding the use of this medicine in children. Special care may be needed. This medicine has been used in children as young as 6. Persons over 73 years old may have a stronger reaction to this medicine and need smaller doses. Overdosage: If you think you have taken too much of this medicine contact a poison control center or emergency room at once. NOTE: This medicine is only for you. Do not share this medicine with others. What if I miss a dose? If you miss a dose, take it as soon as you can. If it is  almost time for your next dose, take only that dose. Do not take double or extra doses. What may interact with this medicine? -herbal or dietary supplements -local or general anesthetics -medicines for high blood pressure -medicines for prostate problems -rifampin This list may not describe all possible interactions. Give your health care provider a list of all the medicines, herbs, non-prescription drugs, or dietary supplements you use. Also tell them if you smoke, drink alcohol, or use illegal drugs. Some items may interact with your medicine. What should I watch for while using this medicine? Visit your doctor or health care professional for regular check ups. Check your blood pressure and pulse rate regularly. Ask your health care professional what your blood pressure and pulse rate should be, and when you should contact him  or her. This medicine may make you feel confused, dizzy or lightheaded. Do not drive, use machinery, or do anything that needs mental alertness until you know how this medicine affects you. To reduce the risk of dizzy or fainting spells, do not sit or stand up quickly, especially if you are an older patient. Avoid alcoholic drinks; they can make you more dizzy. Do not suddenly stop taking amlodipine. Ask your doctor or health care professional how you can gradually reduce the dose. What side effects may I notice from receiving this medicine? Side effects that you should report to your doctor or health care professional as soon as possible: -allergic reactions like skin rash, itching or hives, swelling of the face, lips, or tongue -breathing problems -changes in vision or hearing -chest pain -fast, irregular heartbeat -swelling of legs or ankles Side effects that usually do not require medical attention (report to your doctor or health care professional if they continue or are bothersome): -dry mouth -facial flushing -nausea, vomiting -stomach gas, pain -tired,  weak -trouble sleeping This list may not describe all possible side effects. Call your doctor for medical advice about side effects. You may report side effects to FDA at 1-800-FDA-1088. Where should I keep my medicine? Keep out of the reach of children. Store at room temperature between 59 and 86 degrees F (15 and 30 degrees C). Protect from light. Keep container tightly closed. Throw away any unused medicine after the expiration date. NOTE: This sheet is a summary. It may not cover all possible information. If you have questions about this medicine, talk to your doctor, pharmacist, or health care provider.  2015, [employer]/Gold Standard. (2012-08-21 11:40:58)  Carvedilol tablets What is this medicine? CARVEDILOL (KAR ve dil ol) is a beta-blocker. Beta-blockers reduce the workload on the heart and help it to beat more regularly. This medicine is used to treat high blood pressure and heart failure. This medicine may be used for other purposes; ask your health care provider or pharmacist if you have questions. COMMON BRAND NAME(S): Coreg What should I tell my health care provider before I take this medicine? They need to know if you have any of these conditions: -circulation problems -diabetes -history of heart attack or heart disease -liver disease -lung or breathing disease, like asthma or emphysema -pheochromocytoma -slow or irregular heartbeat -thyroid disease -an unusual or allergic reaction to carvedilol, other beta-blockers, medicines, foods, dyes, or preservatives -pregnant or trying to get pregnant -breast-feeding How should I use this medicine? Take this medicine by mouth with a glass of water. Follow the directions on the prescription label. It is best to take the tablets with food. Take your doses at regular intervals. Do not take your medicine more often than directed. Do not stop taking except on the advice of your doctor or health care professional. Talk to your  pediatrician regarding the use of this medicine in children. Special care may be needed. Overdosage: If you think you have taken too much of this medicine contact a poison control center or emergency room at once. NOTE: This medicine is only for you. Do not share this medicine with others. What if I miss a dose? If you miss a dose, take it as soon as you can. If it is almost time for your next dose, take only that dose. Do not take double or extra doses. What may interact with this medicine? This medicine may interact with the following medications: -certain medicines for blood pressure, heart disease, irregular heart beat -  certain medicines for depression, like fluoxetine or paroxetine -certain medicines for diabetes, like glipizide or glyburide -cimetidine -clonidine -cyclosporine -digoxin -MAOIs like Carbex, Eldepryl, Marplan, Nardil, and Parnate -reserpine -rifampin This list may not describe all possible interactions. Give your health care provider a list of all the medicines, herbs, non-prescription drugs, or dietary supplements you use. Also tell them if you smoke, drink alcohol, or use illegal drugs. Some items may interact with your medicine. What should I watch for while using this medicine? Check your heart rate and blood pressure regularly while you are taking this medicine. Ask your doctor or health care professional what your heart rate and blood pressure should be, and when you should contact him or her. Do not stop taking this medicine suddenly. This could lead to serious heart-related effects. Contact your doctor or health care professional if you have difficulty breathing while taking this drug. Check your weight daily. Ask your doctor or health care professional when you should notify him/her of any weight gain. You may get drowsy or dizzy. Do not drive, use machinery, or do anything that requires mental alertness until you know how this medicine affects you. To reduce the  risk of dizzy or fainting spells, do not sit or stand up quickly. Alcohol can make you more drowsy, and increase flushing and rapid heartbeats. Avoid alcoholic drinks. If you have diabetes, check your blood sugar as directed. Tell your doctor if you have changes in your blood sugar while you are taking this medicine. If you are going to have surgery, tell your doctor or health care professional that you are taking this medicine. What side effects may I notice from receiving this medicine? Side effects that you should report to your doctor or health care professional as soon as possible: -allergic reactions like skin rash, itching or hives, swelling of the face, lips, or tongue -breathing problems -dark urine -irregular heartbeat -swollen legs or ankles -vomiting -yellowing of the eyes or skin Side effects that usually do not require medical attention (report to your doctor or health care professional if they continue or are bothersome): -change in sex drive or performance -diarrhea -dry eyes (especially if wearing contact lenses) -dry, itching skin -headache -nausea -unusually tired This list may not describe all possible side effects. Call your doctor for medical advice about side effects. You may report side effects to FDA at 1-800-FDA-1088. Where should I keep my medicine? Keep out of the reach of children. Store at room temperature below 30 degrees C (86 degrees F). Protect from moisture. Keep container tightly closed. Throw away any unused medicine after the expiration date. NOTE: This sheet is a summary. It may not cover all possible information. If you have questions about this medicine, talk to your doctor, pharmacist, or health care provider.  2015, [employer] Standard. (2013-05-30 14:12:02)  Hydrochlorothiazide, HCTZ capsules or tablets What is this medicine? HYDROCHLOROTHIAZIDE (hye droe klor oh THYE a zide) is a diuretic. It increases the amount of urine passed, which  causes the body to lose salt and water. This medicine is used to treat high blood pressure. It is also reduces the swelling and water retention caused by various medical conditions, such as heart, liver, or kidney disease. This medicine may be used for other purposes; ask your health care provider or pharmacist if you have questions. COMMON BRAND NAME(S): Esidrix, Ezide, HydroDIURIL, Microzide, Oretic, Zide What should I tell my health care provider before I take this medicine? They need to know if you have any  of these conditions: -diabetes -gout -immune system problems, like lupus -kidney disease or kidney stones -liver disease -pancreatitis -small amount of urine or difficulty passing urine -an unusual or allergic reaction to hydrochlorothiazide, sulfa drugs, other medicines, foods, dyes, or preservatives -pregnant or trying to get pregnant -breast-feeding How should I use this medicine? Take this medicine by mouth with a glass of water. Follow the directions on the prescription label. Take your medicine at regular intervals. Remember that you will need to pass urine frequently after taking this medicine. Do not take your doses at a time of day that will cause you problems. Do not stop taking your medicine unless your doctor tells you to. Talk to your pediatrician regarding the use of this medicine in children. Special care may be needed. Overdosage: If you think you have taken too much of this medicine contact a poison control center or emergency room at once. NOTE: This medicine is only for you. Do not share this medicine with others. What if I miss a dose? If you miss a dose, take it as soon as you can. If it is almost time for your next dose, take only that dose. Do not take double or extra doses. What may interact with this medicine? -cholestyramine -colestipol -digoxin -dofetilide -lithium -medicines for blood pressure -medicines for diabetes -medicines that relax muscles for  surgery -other diuretics -steroid medicines like prednisone or cortisone This list may not describe all possible interactions. Give your health care provider a list of all the medicines, herbs, non-prescription drugs, or dietary supplements you use. Also tell them if you smoke, drink alcohol, or use illegal drugs. Some items may interact with your medicine. What should I watch for while using this medicine? Visit your doctor or health care professional for regular checks on your progress. Check your blood pressure as directed. Ask your doctor or health care professional what your blood pressure should be and when you should contact him or her. You may need to be on a special diet while taking this medicine. Ask your doctor. Check with your doctor or health care professional if you get an attack of severe diarrhea, nausea and vomiting, or if you sweat a lot. The loss of too much body fluid can make it dangerous for you to take this medicine. You may get drowsy or dizzy. Do not drive, use machinery, or do anything that needs mental alertness until you know how this medicine affects you. Do not stand or sit up quickly, especially if you are an older patient. This reduces the risk of dizzy or fainting spells. Alcohol may interfere with the effect of this medicine. Avoid alcoholic drinks. This medicine may affect your blood sugar level. If you have diabetes, check with your doctor or health care professional before changing the dose of your diabetic medicine. This medicine can make you more sensitive to the sun. Keep out of the sun. If you cannot avoid being in the sun, wear protective clothing and use sunscreen. Do not use sun lamps or tanning beds/booths. What side effects may I notice from receiving this medicine? Side effects that you should report to your doctor or health care professional as soon as possible: -allergic reactions such as skin rash or itching, hives, swelling of the lips, mouth, tongue,  or throat -changes in vision -chest pain -eye pain -fast or irregular heartbeat -feeling faint or lightheaded, falls -gout attack -muscle pain or cramps -pain or difficulty when passing urine -pain, tingling, numbness in the hands or  feet -redness, blistering, peeling or loosening of the skin, including inside the mouth -unusually weak or tired Side effects that usually do not require medical attention (report to your doctor or health care professional if they continue or are bothersome): -change in sex drive or performance -dry mouth -headache -stomach upset This list may not describe all possible side effects. Call your doctor for medical advice about side effects. You may report side effects to FDA at 1-800-FDA-1088. Where should I keep my medicine? Keep out of the reach of children. Store at room temperature between 15 and 30 degrees C (59 and 86 degrees F). Do not freeze. Protect from light and moisture. Keep container closed tightly. Throw away any unused medicine after the expiration date. NOTE: This sheet is a summary. It may not cover all possible information. If you have questions about this medicine, talk to your doctor, pharmacist, or health care provider.  2015, [employer]vier/[employer] Standard. (2010-05-18 12:57:37)

## 2014-10-07 ENCOUNTER — Encounter (HOSPITAL_COMMUNITY): Payer: Self-pay | Admitting: *Deleted

## 2014-10-07 ENCOUNTER — Emergency Department (HOSPITAL_COMMUNITY)
Admission: EM | Admit: 2014-10-07 | Discharge: 2014-10-07 | Disposition: A | Discharge status: Home / Self Care | Payer: Self-pay | Attending: Emergency Medicine | Admitting: Emergency Medicine

## 2014-10-07 DIAGNOSIS — X58XXXA Exposure to other specified factors, initial encounter: Secondary | ICD-10-CM | POA: Insufficient documentation

## 2014-10-07 DIAGNOSIS — Z79899 Other long term (current) drug therapy: Secondary | ICD-10-CM | POA: Insufficient documentation

## 2014-10-07 DIAGNOSIS — Y9289 Other specified places as the place of occurrence of the external cause: Secondary | ICD-10-CM | POA: Insufficient documentation

## 2014-10-07 DIAGNOSIS — Z720 Tobacco use: Secondary | ICD-10-CM | POA: Insufficient documentation

## 2014-10-07 DIAGNOSIS — S39012A Strain of muscle, fascia and tendon of lower back, initial encounter: Secondary | ICD-10-CM | POA: Insufficient documentation

## 2014-10-07 DIAGNOSIS — I10 Essential (primary) hypertension: Secondary | ICD-10-CM | POA: Insufficient documentation

## 2014-10-07 DIAGNOSIS — Y9389 Activity, other specified: Secondary | ICD-10-CM | POA: Insufficient documentation

## 2014-10-07 DIAGNOSIS — Y998 Other external cause status: Secondary | ICD-10-CM | POA: Insufficient documentation

## 2014-10-07 LAB — URINALYSIS, ROUTINE W REFLEX MICROSCOPIC
Bilirubin Urine: NEGATIVE
Glucose, UA: NEGATIVE mg/dL
Hgb urine dipstick: NEGATIVE
KETONES UR: NEGATIVE mg/dL
LEUKOCYTES UA: NEGATIVE
Nitrite: NEGATIVE
Protein, ur: NEGATIVE mg/dL
Specific Gravity, Urine: 1.019 (ref 1.005–1.030)
UROBILINOGEN UA: 0.2 mg/dL (ref 0.0–1.0)
pH: 7.5 (ref 5.0–8.0)

## 2014-10-07 MED ORDER — MORPHINE SULFATE 4 MG/ML IJ SOLN
4.0000 mg | Freq: Once | INTRAMUSCULAR | Status: AC
  Administered 2014-10-07: 4 mg via INTRAVENOUS
  Filled 2014-10-07: qty 1

## 2014-10-07 MED ORDER — CYCLOBENZAPRINE HCL 10 MG PO TABS: 10.0000 mg | ORAL_TABLET | Freq: Two times a day (BID) | ORAL | Status: DC | PRN

## 2014-10-07 MED ORDER — TRAMADOL HCL 50 MG PO TABS: 50.0000 mg | ORAL_TABLET | Freq: Four times a day (QID) | ORAL | Status: DC | PRN

## 2014-10-07 MED ORDER — KETOROLAC TROMETHAMINE 30 MG/ML IJ SOLN
30.0000 mg | Freq: Once | INTRAMUSCULAR | Status: AC
  Administered 2014-10-07: 30 mg via INTRAVENOUS
  Filled 2014-10-07: qty 1

## 2014-10-07 MED ORDER — ONDANSETRON HCL 4 MG/2ML IJ SOLN
4.0000 mg | Freq: Once | INTRAMUSCULAR | Status: DC
  Filled 2014-10-07: qty 2

## 2014-10-07 MED ORDER — DIAZEPAM 5 MG/ML IJ SOLN
5.0000 mg | Freq: Once | INTRAMUSCULAR | Status: AC
  Administered 2014-10-07: 5 mg via INTRAVENOUS
  Filled 2014-10-07: qty 2

## 2014-10-07 NOTE — ED Notes (Signed)
Pt states that he has had left lower back pain since last night. Pain is sharp, constant and nonradiating. Pt denies any urinary symptoms. Pt states that he was moving mattresses 2 days ago.

## 2014-10-07 NOTE — ED Provider Notes (Signed)
CSN: [id number]     Arrival date & time 10/07/14  0818 History   First MD Initiated Contact with Patient 10/07/14 0840     Chief Complaint  Patient presents with  . Back Pain     (Consider location/radiation/quality/duration/timing/severity/associated sxs/prior Treatment) HPI Comments: Patient is a 49 year old male with a past medical history of hypertension who presents with back pain that started yesterday. Symptoms started gradually and progressively worsened since the onset. The pain is located in the left lower back without radiation. The pain is aching and severe. Movement makes the pain worse. No alleviating factors. Patient tried OTC medication without relief. Patient reports moving a heavy mattress and box spring 2 days ago.    Past Medical History  Diagnosis Date  . Hypertension   . Chest pain     a. 07/2012 Lexiscan Sestamibi:  no ischemia/infarct, nl EF.  . Tobacco abuse    Past Surgical History  Procedure Laterality Date  . Wrist fracture surgery  ~ 2009    right; "put a plate in it" (40/98/1191)   Family History  Problem Relation Age of Onset  . Other      No known heart disease   History  Substance Use Topics  . Smoking status: Current Every Day Smoker -- 1.00 packs/day for 26 years    Types: Cigarettes  . Smokeless tobacco: Never Used  . Alcohol Use: Yes     Comment: #2 40oz/ day    Review of Systems  Constitutional: Negative for fever, chills and fatigue.  HENT: Negative for trouble swallowing.   Eyes: Negative for visual disturbance.  Respiratory: Negative for shortness of breath.   Cardiovascular: Negative for chest pain and palpitations.  Gastrointestinal: Negative for nausea, vomiting, abdominal pain and diarrhea.  Genitourinary: Negative for dysuria and difficulty urinating.  Musculoskeletal: Positive for back pain. Negative for arthralgias and neck pain.  Skin: Negative for color change.  Neurological: Negative for dizziness and weakness.   Psychiatric/Behavioral: Negative for dysphoric mood.      Allergies  Review of patient's allergies indicates no known allergies.  Home Medications   Prior to Admission medications   Medication Sig Start Date End Date Taking? Authorizing Provider  amLODipine (NORVASC) 5 MG tablet Take 1 tablet (5 mg total) by mouth daily. 08/08/14   Dione Booze, MD  carvedilol (COREG) 6.25 MG tablet Take 1 tablet (6.25 mg total) by mouth 2 (two) times daily with a meal. 04/03/13   Antony Madura, PA-C  carvedilol (COREG) 6.25 MG tablet Take 1 tablet (6.25 mg total) by mouth 2 (two) times daily with a meal. 08/08/14   Dione Booze, MD  hydrochlorothiazide (HYDRODIURIL) 12.5 MG tablet Take 1 tablet (12.5 mg total) by mouth daily. 08/08/14   Dione Booze, MD  meclizine (ANTIVERT) 25 MG tablet Take 1 tablet (25 mg total) by mouth 3 (three) times daily as needed for dizziness. 08/08/14   Dione Booze, MD  traMADol (ULTRAM) 50 MG tablet Take 50 mg by mouth every 6 (six) hours as needed for pain.    Historical Provider, MD   BP 192/99 mmHg  Pulse 83  Temp(Src) 97.5 F (36.4 C) (Oral)  Resp 18  SpO2 99% Physical Exam  Constitutional: He is oriented to person, place, and time. He appears well-developed and well-nourished. No distress.  HENT:  Head: Normocephalic and atraumatic.  Eyes: Conjunctivae and EOM are normal.  Neck: Normal range of motion.  Cardiovascular: Normal rate and regular rhythm.  Exam reveals no gallop  and no friction rub.   No murmur heard. Pulmonary/Chest: Effort normal and breath sounds normal. He has no wheezes. He has no rales. He exhibits no tenderness.  Abdominal: Soft. He exhibits no distension. There is no tenderness. There is no rebound and no guarding.  Genitourinary:  No CVA tenderness.   Musculoskeletal: Normal range of motion.  No midline spine tenderness to palpation. Left lumbar paraspinal tenderness to palpation.   Neurological: He is alert and oriented to person, place, and  time. Coordination normal.  Speech is goal-oriented. Moves limbs without ataxia. Extremity strength and sensation equal and intact bilaterally.   Skin: Skin is warm and dry.  Psychiatric: He has a normal mood and affect. His behavior is normal.  Nursing note and vitals reviewed.   ED Course  Procedures (including critical care time) Labs Review Labs Reviewed  URINALYSIS, ROUTINE W REFLEX MICROSCOPIC    Imaging Review No results found.   EKG Interpretation None      MDM   Final diagnoses:  Lumbar strain, initial encounter    8:51 AM Urinalysis pending. Patient likely has a lumbar muscle strain. Vitals stable and patient afebrile.   10:59 AM Patient's urinalysis unremarkable for acute changes. No bladder/bowel incontinence or saddle paresthesias. Vitals stable and patient afebrile.    Emilia Beck, PA-C 10/07/14 1059  Rolland Porter, MD 10/14/14 [id number]

## 2014-10-07 NOTE — Discharge Instructions (Signed)
Take Tramadol as needed for pain. Take Flexeril as needed for muscle spasm. Refer to attached documents for more information.  °

## 2015-01-29 ENCOUNTER — Emergency Department (HOSPITAL_BASED_OUTPATIENT_CLINIC_OR_DEPARTMENT_OTHER): Payer: Self-pay

## 2015-01-29 ENCOUNTER — Encounter (HOSPITAL_BASED_OUTPATIENT_CLINIC_OR_DEPARTMENT_OTHER): Payer: Self-pay

## 2015-01-29 ENCOUNTER — Observation Stay (HOSPITAL_BASED_OUTPATIENT_CLINIC_OR_DEPARTMENT_OTHER)
Admission: EM | Admit: 2015-01-29 | Discharge: 2015-01-30 | Disposition: A | Discharge status: Home / Self Care | Payer: Self-pay | Attending: Internal Medicine | Admitting: Internal Medicine

## 2015-01-29 DIAGNOSIS — Z720 Tobacco use: Secondary | ICD-10-CM | POA: Diagnosis present

## 2015-01-29 DIAGNOSIS — I10 Essential (primary) hypertension: Secondary | ICD-10-CM | POA: Diagnosis present

## 2015-01-29 DIAGNOSIS — J42 Unspecified chronic bronchitis: Principal | ICD-10-CM | POA: Insufficient documentation

## 2015-01-29 DIAGNOSIS — B20 Human immunodeficiency virus [HIV] disease: Secondary | ICD-10-CM | POA: Diagnosis present

## 2015-01-29 DIAGNOSIS — R5381 Other malaise: Secondary | ICD-10-CM

## 2015-01-29 DIAGNOSIS — M109 Gout, unspecified: Secondary | ICD-10-CM | POA: Diagnosis present

## 2015-01-29 DIAGNOSIS — F17210 Nicotine dependence, cigarettes, uncomplicated: Secondary | ICD-10-CM | POA: Insufficient documentation

## 2015-01-29 DIAGNOSIS — M1000 Idiopathic gout, unspecified site: Secondary | ICD-10-CM | POA: Insufficient documentation

## 2015-01-29 DIAGNOSIS — B379 Candidiasis, unspecified: Secondary | ICD-10-CM | POA: Insufficient documentation

## 2015-01-29 DIAGNOSIS — Z21 Asymptomatic human immunodeficiency virus [HIV] infection status: Secondary | ICD-10-CM | POA: Diagnosis present

## 2015-01-29 DIAGNOSIS — Z79899 Other long term (current) drug therapy: Secondary | ICD-10-CM | POA: Insufficient documentation

## 2015-01-29 DIAGNOSIS — B370 Candidal stomatitis: Secondary | ICD-10-CM | POA: Diagnosis present

## 2015-01-29 LAB — URINALYSIS, ROUTINE W REFLEX MICROSCOPIC
Bilirubin Urine: NEGATIVE
Glucose, UA: NEGATIVE mg/dL
Hgb urine dipstick: NEGATIVE
Ketones, ur: NEGATIVE mg/dL
LEUKOCYTES UA: NEGATIVE
NITRITE: NEGATIVE
Protein, ur: NEGATIVE mg/dL
SPECIFIC GRAVITY, URINE: 1.018 (ref 1.005–1.030)
UROBILINOGEN UA: 1 mg/dL (ref 0.0–1.0)
pH: 7.5 (ref 5.0–8.0)

## 2015-01-29 LAB — CBC WITH DIFFERENTIAL/PLATELET
BASOS ABS: 0 10*3/uL (ref 0.0–0.1)
Basophils Relative: 0 % (ref 0–1)
Eosinophils Absolute: 0.1 10*3/uL (ref 0.0–0.7)
Eosinophils Relative: 2 % (ref 0–5)
HEMATOCRIT: 37.7 % — AB (ref 39.0–52.0)
Hemoglobin: 12.4 g/dL — ABNORMAL LOW (ref 13.0–17.0)
Lymphocytes Relative: 42 % (ref 12–46)
Lymphs Abs: 2.5 10*3/uL (ref 0.7–4.0)
MCH: 28.4 pg (ref 26.0–34.0)
MCHC: 32.9 g/dL (ref 30.0–36.0)
MCV: 86.3 fL (ref 78.0–100.0)
MONO ABS: 0.6 10*3/uL (ref 0.1–1.0)
Monocytes Relative: 11 % (ref 3–12)
Neutro Abs: 2.7 10*3/uL (ref 1.7–7.7)
Neutrophils Relative %: 45 % (ref 43–77)
PLATELETS: 352 10*3/uL (ref 150–400)
RBC: 4.37 MIL/uL (ref 4.22–5.81)
RDW: 13.6 % (ref 11.5–15.5)
WBC: 5.9 10*3/uL (ref 4.0–10.5)

## 2015-01-29 LAB — COMPREHENSIVE METABOLIC PANEL
ALBUMIN: 2.6 g/dL — AB (ref 3.5–5.2)
ALT: 9 U/L (ref 0–53)
ANION GAP: 3 — AB (ref 5–15)
AST: 19 U/L (ref 0–37)
Alkaline Phosphatase: 86 U/L (ref 39–117)
BILIRUBIN TOTAL: 0.6 mg/dL (ref 0.3–1.2)
BUN: 8 mg/dL (ref 6–23)
CALCIUM: 8 mg/dL — AB (ref 8.4–10.5)
CO2: 30 mmol/L (ref 19–32)
CREATININE: 0.82 mg/dL (ref 0.50–1.35)
Chloride: 101 mmol/L (ref 96–112)
GFR calc Af Amer: >90 mL/min (ref >90)
GFR calc non Af Amer: >90 mL/min (ref >90)
Glucose, Bld: 108 mg/dL — ABNORMAL HIGH (ref 70–99)
Potassium: 4.3 mmol/L (ref 3.5–5.1)
SODIUM: 134 mmol/L — AB (ref 135–145)
Total Protein: 8.2 g/dL (ref 6.0–8.3)

## 2015-01-29 LAB — RAPID HIV SCREEN (HIV 1/2 AB+AG)
HIV 1/2 ANTIBODIES: REACTIVE — AB
HIV-1 P24 ANTIGEN - HIV24: NONREACTIVE

## 2015-01-29 LAB — RAPID STREP SCREEN (MED CTR MEBANE ONLY): Streptococcus, Group A Screen (Direct): NEGATIVE

## 2015-01-29 MED ORDER — HYDROCHLOROTHIAZIDE 25 MG PO TABS
12.5000 mg | ORAL_TABLET | Freq: Every day | ORAL | Status: DC | PRN
  Filled 2015-01-29: qty 0.5

## 2015-01-29 MED ORDER — NYSTATIN 100000 UNIT/ML MT SUSP
5.0000 mL | Freq: Four times a day (QID) | OROMUCOSAL | Status: DC
  Administered 2015-01-29 – 2015-01-30 (×2): 500000 [IU] via ORAL
  Filled 2015-01-29 (×5): qty 5

## 2015-01-29 MED ORDER — FLUCONAZOLE IN SODIUM CHLORIDE 200-0.9 MG/100ML-% IV SOLN
200.0000 mg | Freq: Once | INTRAVENOUS | Status: AC
  Administered 2015-01-29: 200 mg via INTRAVENOUS
  Filled 2015-01-29 (×3): qty 100

## 2015-01-29 MED ORDER — IOHEXOL 300 MG/ML  SOLN
80.0000 mL | Freq: Once | INTRAMUSCULAR | Status: AC | PRN
  Administered 2015-01-29: 80 mL via INTRAVENOUS

## 2015-01-29 MED ORDER — HEPARIN SODIUM (PORCINE) 5000 UNIT/ML IJ SOLN
5000.0000 [IU] | Freq: Three times a day (TID) | INTRAMUSCULAR | Status: DC
  Administered 2015-01-29 – 2015-01-30 (×2): 5000 [IU] via SUBCUTANEOUS
  Filled 2015-01-29 (×4): qty 1

## 2015-01-29 MED ORDER — SODIUM CHLORIDE 0.9 % IV BOLUS (SEPSIS)
1000.0000 mL | Freq: Once | INTRAVENOUS | Status: AC
  Administered 2015-01-29: 1000 mL via INTRAVENOUS

## 2015-01-29 MED ORDER — AMLODIPINE BESYLATE 5 MG PO TABS
5.0000 mg | ORAL_TABLET | Freq: Every day | ORAL | Status: DC
  Administered 2015-01-29 – 2015-01-30 (×2): 5 mg via ORAL
  Filled 2015-01-29 (×2): qty 1

## 2015-01-29 MED ORDER — PNEUMOCOCCAL VAC POLYVALENT 25 MCG/0.5ML IJ INJ
0.5000 mL | INJECTION | INTRAMUSCULAR | Status: DC
  Filled 2015-01-29: qty 0.5

## 2015-01-29 MED ORDER — CARVEDILOL 6.25 MG PO TABS
6.2500 mg | ORAL_TABLET | Freq: Two times a day (BID) | ORAL | Status: DC
  Administered 2015-01-30: 6.25 mg via ORAL
  Filled 2015-01-29 (×3): qty 1

## 2015-01-29 NOTE — ED Notes (Signed)
Patient here with ongoing cough and congestion for months. Seen 4/12 and has finished z-pack and has hydrocodone elixir for cough, no distress. smoker

## 2015-01-29 NOTE — ED Provider Notes (Signed)
CSN: [id number]     Arrival date & time 01/29/15  1234 History   First MD Initiated Contact with Patient 01/29/15 1322     Chief Complaint  Patient presents with  . Cough     (Consider location/radiation/quality/duration/timing/severity/associated sxs/prior Treatment) The history is provided by the patient. No language interpreter was used.  Brandon Quinn Brandon Quinn is a 49 year old male with past medical history of hypertension, chest pain, tobacco abuse presenting to the ED with cough that is been ongoing for approximately 3 years with worsening over the past 3 weeks. Patient reports he's been having nasal congestion, increased cough-more frequently, sore throat. Stated that when he coughs it is dry. Reported that he has pain with swallowing for the past 3 weeks, described to be sore-reported that he is concerned secondary to oral sexual activity with the male 6 months ago, believes that he his infected with a possible STD in his throat. Patient reports that he's been having unexpected weight loss, has been waking up in the middle the night with sweats-stated that this is been ongoing for a couple of months. Patient reported that he has been smoking cigarettes for approximately 17 years, at least one pack per day. Stated that he was seen and assessed at [facility] on 01/17/2015 where he was diagnosed with chronic bronchitis and started on a Z-Pak that he finished 10 days ago. Patient denied difficulty swallowing, hemoptysis, travels, sick contacts, environmental allergies, abdominal pain, nausea, vomiting, diarrhea, changes eating habits, blurred vision, sudden loss of vision, chest pain, shortness of breath, difficulty breathing. PCP none  Past Medical History  Diagnosis Date  . Hypertension   . Chest pain     a. 07/2012 Lexiscan Sestamibi:  no ischemia/infarct, nl EF.  . Tobacco abuse    Past Surgical History  Procedure Laterality Date  . Wrist fracture surgery  ~ 2009    right; "put a  plate in it" (40/98/119110/24/2013)   Family History  Problem Relation Age of Onset  . Other      No known heart disease   History  Substance Use Topics  . Smoking status: Current Every Day Smoker -- 1.00 packs/day for 26 years    Types: Cigarettes  . Smokeless tobacco: Never Used  . Alcohol Use: Yes     Comment: #2 40oz/ day    Review of Systems  Constitutional: Positive for fever (subjective), chills and unexpected weight change.  HENT: Positive for congestion and sore throat. Negative for ear discharge, ear pain and trouble swallowing.   Eyes: Negative for visual disturbance.  Respiratory: Positive for cough. Negative for chest tightness and shortness of breath.   Cardiovascular: Negative for chest pain.  Gastrointestinal: Negative for nausea, vomiting, abdominal pain and diarrhea.  Genitourinary: Negative for dysuria, hematuria, decreased urine volume, discharge, penile swelling, scrotal swelling, genital sores, penile pain and testicular pain.  Musculoskeletal: Negative for back pain, neck pain and neck stiffness.  Neurological: Negative for dizziness, weakness and numbness.      Allergies  Review of patient's allergies indicates no known allergies.  Home Medications   Prior to Admission medications   Medication Sig Start Date End Date Taking? Authorizing Provider  amLODipine (NORVASC) 5 MG tablet Take 1 tablet (5 mg total) by mouth daily. 08/08/14   Dione Boozeavid Glick, MD  carvedilol (COREG) 6.25 MG tablet Take 1 tablet (6.25 mg total) by mouth 2 (two) times daily with a meal. 04/03/13   Antony MaduraKelly Humes, PA-C  hydrochlorothiazide (HYDRODIURIL) 12.5 MG tablet Take 1  tablet (12.5 mg total) by mouth daily. Patient taking differently: Take 12.5 mg by mouth daily as needed (Takes only when he feels like BP is high).  08/08/14   Dione Booze, MD   BP 162/69 mmHg  Pulse 69  Temp(Src) 99.1 F (37.3 C) (Oral)  Resp 18  Wt 200 lb (90.719 kg)  SpO2 100% Physical Exam  Constitutional: He is  oriented to person, place, and time. He appears well-developed and well-nourished. No distress.  HENT:  Head: Normocephalic and atraumatic.  Right Ear: Hearing, tympanic membrane, external ear and ear canal normal. No mastoid tenderness.  Left Ear: Hearing, tympanic membrane, external ear and ear canal normal. No mastoid tenderness.  Mouth/Throat: No trismus in the jaw. Normal dentition. Dental caries present. No dental abscesses or uvula swelling. Oropharyngeal exudate present. No posterior oropharyngeal edema, posterior oropharyngeal erythema or tonsillar abscesses.  Erythema and a thrush presentation identified to the posterior oropharynx. Tonsillar adenopathy identified with mild exudate. Petechiae identified to the soft palate. Uvula midline with symmetrical elevation. Negative uvula deviation. Negative peritonsillar swelling or pain upon palpation. Negative trismus. Negative sublingual lesions.  Eyes: Conjunctivae and EOM are normal. Pupils are equal, round, and reactive to light. Right eye exhibits no discharge. Left eye exhibits no discharge.  Neck: Normal range of motion. Neck supple. No tracheal deviation present.  Negative neck stiffness Negative nuchal rigidity Negative cervical lymphadenopathy Negative meningeal signs  Cardiovascular: Normal rate, regular rhythm and normal heart sounds.  Exam reveals no friction rub.   No murmur heard. Pulses:      Radial pulses are 2+ on the right side, and 2+ on the left side.  Cap refill less than 3 seconds  Pulmonary/Chest: Effort normal and breath sounds normal. No respiratory distress. He has no wheezes. He has no rales. He exhibits no tenderness.  Musculoskeletal: Normal range of motion.  Full ROM to upper and lower extremities without difficulty noted, negative ataxia noted.  Lymphadenopathy:    He has no cervical adenopathy.  Neurological: He is alert and oriented to person, place, and time. No cranial nerve deficit. He exhibits normal  muscle tone. Coordination normal.  Cranial nerves III-XII grossly intact Strength 5+/5+ to upper and lower extremities bilaterally with resistance applied, equal distribution noted Fine motor skills intact Negative arm drift Patient tells commands well Patient responds to questions appropriately Negative facial droop Negative slurred speech Negative aphasia  Skin: Skin is warm and dry. No rash noted. He is not diaphoretic. No erythema.  Psychiatric: He has a normal mood and affect. His behavior is normal. Thought content normal.  Nursing note and vitals reviewed.   ED Course  Procedures (including critical care time) Labs Review Labs Reviewed  CBC WITH DIFFERENTIAL/PLATELET - Abnormal; Notable for the following:    Hemoglobin 12.4 (*)    HCT 37.7 (*)    All other components within normal limits  COMPREHENSIVE METABOLIC PANEL - Abnormal; Notable for the following:    Sodium 134 (*)    Glucose, Bld 108 (*)    Calcium 8.0 (*)    Albumin 2.6 (*)    Anion gap 3 (*)    All other components within normal limits  RAPID HIV SCREEN (HIV 1/2 AB+AG) - Abnormal; Notable for the following:    HIV 1/2 Antibodies Reactive (*)    All other components within normal limits  RAPID STREP SCREEN  CULTURE, GROUP A STREP  CULTURE, BLOOD (ROUTINE X 2)  CULTURE, BLOOD (ROUTINE X 2)  URINE CULTURE  HIV CONF MULTISPOT  HIV ANTIBODY (ROUTINE TESTING)  URINALYSIS, ROUTINE W REFLEX MICROSCOPIC  INFLUENZA PANEL BY PCR (TYPE A & B, H1N1)    Imaging Review Dg Chest 2 View  01/29/2015   CLINICAL DATA:  Cough and congestion  EXAM: CHEST  2 VIEW  COMPARISON:  01/16/2015  FINDINGS: The heart size and mediastinal contours are within normal limits. Both lungs are clear. The visualized skeletal structures are unremarkable.  IMPRESSION: No active cardiopulmonary disease.   Electronically Signed   By: Marlan Palau M.D.   On: 01/29/2015 14:11   Ct Soft Tissue Neck W Contrast  01/29/2015   CLINICAL DATA:  One  month history of cough associated with difficulty swallowing and shortness of breath.  EXAM: CT NECK WITH CONTRAST  TECHNIQUE: Multidetector CT imaging of the neck was performed using the standard protocol following the bolus administration of intravenous contrast.  CONTRAST:  80mL OMNIPAQUE IOHEXOL 300 MG/ML IV.  COMPARISON:  None.  FINDINGS: Pharynx and larynx: Normal and symmetric tonsillar tissue in the pharynx. Normal-appearing larynx. Normal-appearing epiglottis. Normal phayrngeal airway.  Salivary glands: Normal-appearing and symmetric parotid and submandibular salivary glands.  Thyroid: Normal in size and appearance without nodularity.  Lymph nodes: No pathologic cervical lymphadenopathy.  Vascular: Marked intimal thickening involving the common carotid arteries bilaterally. Calcified and noncalcified plaque involving the right carotid bulb without stenosis. Minimal atherosclerosis involving the carotid siphons in the brain. Widely patent jugular veins.  Limited intracranial: Unremarkable.  Visualized orbits: Unremarkable.  Mastoids and visualized paranasal sinuses: Minimal mucosal thickening at the base the right maxillary sinus. Opacification of a solitary right posterior ethmoid air cell. Remaining visualized paranasal sinuses, bilateral mastoid air cells and bilateral middle ear cavities well-aerated.  Skeleton: Degenerative changes at the C1-C2 articulation. No significant skeletal findings otherwise.  Upper chest: Visualized lung apices clear. Residual thymic tissue in the anterior superior mediastinum. Mild aortic arch atherosclerosis.  IMPRESSION: 1. No acute abnormalities involving the neck. 2. Mild aortic arch atherosclerosis, mild atherosclerosis involving the right carotid bulb without stenosis, and marked intimal thickening involving both common carotid arteries.   Electronically Signed   By: Hulan Saas M.D.   On: 01/29/2015 16:31     EKG Interpretation None       3:27 PM This  provider had a long discussion with the patient regarding concerns health. Discussed case, labs, imaging in great detail. Discussed with patient that HIV testing came back positive - educated patient on what HIV is. Discussed that since patient has having pursue sweating, drop in weight loss, fatigue and malaise will transfer patient for admission. Discussed that infectious disease will be consulted. Patient is in accordance to plan of care.  3:57 PM this provider spoke with Dr. Luciana Axe, infectious disease. Discussed case in great detail, labs and imaging. Recommended patient be transferred to [facility] and then infectious disease will consult with patient in the morning.  5:27 PM this provider spoke with Dr. Janee Morn, [facility] hospitalist. Discussed case, labs, imaging in ED course in great detail. Patient to be admitted for new diagnosis of HIV with malaise, fatigue and acute infection thrush. Recommended blood cultures to be obtained.  MDM   Final diagnoses:  HIV (human immunodeficiency virus infection)  Chronic bronchitis, unspecified chronic bronchitis type  Malaise    Medications  sodium chloride 0.9 % bolus 1,000 mL (not administered)  sodium chloride 0.9 % bolus 1,000 mL (not administered)  fluconazole (DIFLUCAN) IVPB 200 mg (not administered)  iohexol (OMNIPAQUE) 300 MG/ML solution  80 mL (80 mLs Intravenous Contrast Given 01/29/15 1601)    Filed Vitals:   01/29/15 1245 01/29/15 1503  BP: 151/61 162/69  Pulse: 64 69  Temp: 99.7 F (37.6 C) 99.1 F (37.3 C)  TempSrc: Oral Oral  Resp: 18 18  Weight: 200 lb (90.719 kg)   SpO2: 98% 100%   CBC negative elevated leukocytosis. Hemoglobin 12.4, hematocrit 37.7. CMP noted sodium of 134. Glucose 108, negative elevated anion gap. Rapid HIV screen reactive. Rapid strep test negative. Chest x-ray no active cardiopulmonary disease identified. CT soft tissue of neck with contrast no acute abnormalities identified. Mild aortic arch  atherosclerosis identified. Doubt Ludwig's angina. Doubt peritonsillar abscess. Doubt retropharyngeal abscess. CT soft tissue of neck with contrast no findings of acute abnormalities. Patient presenting to the ED with new diagnosis of HIV, active thrush noted on physical examination. Concerns for possible beginnings of COPD secondary to cough and history of smoking. Patient presenting with malaise, increased weight loss, decreased appetite and drinking. Patient hydrated in ED setting. Had a long discussion with patient regarding HIV and symptoms and plan for admission for further workup and assessment by infectious disease-patient agrees and understands. Patient to be transferred to [facility] under the care [facility] hospitalist and to be seen by infectious disease. Patient stable for transfer and agrees to plan of transfer.  Raymon Mutton, PA-C 01/29/15 1732  Gilda Crease, MD 01/30/15 [phone]

## 2015-01-29 NOTE — H&P (Signed)
[facility] History and Physical  Brandon Quinn Nicholos WUJ:[id number] DOB: 13-Feb-1966 DOA: 01/29/2015  Referring physician: EDP PCP: No PCP Per Patient   Chief Complaint: Cough   HPI: Brandon Quinn Lucas is a 49 y.o. male who presents to the ED with 3 year history of cough, worse over the past 3 weeks.  He also has had night sweats, weight loss, pain with swallowing, sore throat during that time and ongoing for past couple of months.  Had unprotected intercourse 6 months ago and worried about an STD.  Found to have new diagnosis of HIV.  Review of Systems: Systems reviewed.  As above, otherwise negative  Past Medical History  Diagnosis Date  . Hypertension   . Chest pain     a. 07/2012 Lexiscan Sestamibi:  no ischemia/infarct, nl EF.  . Tobacco abuse    Past Surgical History  Procedure Laterality Date  . Wrist fracture surgery  ~ 2009    right; "put a plate in it" (95/62/130810/24/2013)   Social History:  reports that he has been smoking Cigarettes.  He has a 26 pack-year smoking history. He has never used smokeless tobacco. He reports that he drinks alcohol. He reports that he does not use illicit drugs.  No Known Allergies  Family History  Problem Relation Age of Onset  . Other      No known heart disease     Prior to Admission medications   Medication Sig Start Date End Date Taking? Authorizing Provider  amLODipine (NORVASC) 5 MG tablet Take 1 tablet (5 mg total) by mouth daily. 08/08/14   Dione Boozeavid Glick, MD  carvedilol (COREG) 6.25 MG tablet Take 1 tablet (6.25 mg total) by mouth 2 (two) times daily with a meal. 04/03/13   Antony MaduraKelly Humes, PA-C  hydrochlorothiazide (HYDRODIURIL) 12.5 MG tablet Take 1 tablet (12.5 mg total) by mouth daily. Patient taking differently: Take 12.5 mg by mouth daily as needed (Takes only when he feels like BP is high).  08/08/14   Dione Boozeavid Glick, MD   Physical Exam: Filed Vitals:   01/29/15 1922  BP: 172/76  Pulse: 77  Temp: 98.9 F (37.2 C)  Resp: 18    BP  172/76 mmHg  Pulse 77  Temp(Src) 98.9 F (37.2 C) (Oral)  Resp 18  Ht 5' 8.4" (1.737 m)  Wt 82.691 kg (182 lb 4.8 oz)  BMI 27.41 kg/m2  SpO2 100%  General Appearance:    Alert, oriented, no distress, appears stated age  Head:    Normocephalic, atraumatic  Eyes:    PERRL, EOMI, sclera non-icteric        Nose:   Nares without drainage or epistaxis. Mucosa, turbinates normal  Throat:   Moist mucous membranes. Oropharynx without erythema or exudate.  Neck:   Supple. No carotid bruits.  No thyromegaly.  No lymphadenopathy.   Back:     No CVA tenderness, no spinal tenderness  Lungs:     Clear to auscultation bilaterally, without wheezes, rhonchi or rales  Chest wall:    No tenderness to palpitation  Heart:    Regular rate and rhythm without murmurs, gallops, rubs  Abdomen:     Soft, non-tender, nondistended, normal bowel sounds, no organomegaly  Genitalia:    deferred  Rectal:    deferred  Extremities:   No clubbing, cyanosis or edema.  Pulses:   2+ and symmetric all extremities  Skin:   Skin color, texture, turgor normal, no rashes or lesions  Lymph nodes:   Cervical, supraclavicular, and axillary  nodes normal  Neurologic:   CNII-XII intact. Normal strength, sensation and reflexes      throughout    Labs on Admission:  Basic Metabolic Panel:  Recent Labs Lab 01/29/15 1405  NA 134*  K 4.3  CL 101  CO2 30  GLUCOSE 108*  BUN 8  CREATININE 0.82  CALCIUM 8.0*   Liver Function Tests:  Recent Labs Lab 01/29/15 1405  AST 19  ALT 9  ALKPHOS 86  BILITOT 0.6  PROT 8.2  ALBUMIN 2.6*   No results for input(s): LIPASE, AMYLASE in the last 168 hours. No results for input(s): AMMONIA in the last 168 hours. CBC:  Recent Labs Lab 01/29/15 1405  WBC 5.9  NEUTROABS 2.7  HGB 12.4*  HCT 37.7*  MCV 86.3  PLT 352   Cardiac Enzymes: No results for input(s): CKTOTAL, CKMB, CKMBINDEX, TROPONINI in the last 168 hours.  BNP (last 3 results) No results for input(s): PROBNP  in the last 8760 hours. CBG: No results for input(s): GLUCAP in the last 168 hours.  Radiological Exams on Admission: Dg Chest 2 View  01/29/2015   CLINICAL DATA:  Cough and congestion  EXAM: CHEST  2 VIEW  COMPARISON:  01/16/2015  FINDINGS: The heart size and mediastinal contours are within normal limits. Both lungs are clear. The visualized skeletal structures are unremarkable.  IMPRESSION: No active cardiopulmonary disease.   Electronically Signed   By: Marlan Palau M.D.   On: 01/29/2015 14:11   Ct Soft Tissue Neck W Contrast  01/29/2015   CLINICAL DATA:  One month history of cough associated with difficulty swallowing and shortness of breath.  EXAM: CT NECK WITH CONTRAST  TECHNIQUE: Multidetector CT imaging of the neck was performed using the standard protocol following the bolus administration of intravenous contrast.  CONTRAST:  80mL OMNIPAQUE IOHEXOL 300 MG/ML IV.  COMPARISON:  None.  FINDINGS: Pharynx and larynx: Normal and symmetric tonsillar tissue in the pharynx. Normal-appearing larynx. Normal-appearing epiglottis. Normal phayrngeal airway.  Salivary glands: Normal-appearing and symmetric parotid and submandibular salivary glands.  Thyroid: Normal in size and appearance without nodularity.  Lymph nodes: No pathologic cervical lymphadenopathy.  Vascular: Marked intimal thickening involving the common carotid arteries bilaterally. Calcified and noncalcified plaque involving the right carotid bulb without stenosis. Minimal atherosclerosis involving the carotid siphons in the brain. Widely patent jugular veins.  Limited intracranial: Unremarkable.  Visualized orbits: Unremarkable.  Mastoids and visualized paranasal sinuses: Minimal mucosal thickening at the base the right maxillary sinus. Opacification of a solitary right posterior ethmoid air cell. Remaining visualized paranasal sinuses, bilateral mastoid air cells and bilateral middle ear cavities well-aerated.  Skeleton: Degenerative changes at  the C1-C2 articulation. No significant skeletal findings otherwise.  Upper chest: Visualized lung apices clear. Residual thymic tissue in the anterior superior mediastinum. Mild aortic arch atherosclerosis.  IMPRESSION: 1. No acute abnormalities involving the neck. 2. Mild aortic arch atherosclerosis, mild atherosclerosis involving the right carotid bulb without stenosis, and marked intimal thickening involving both common carotid arteries.   Electronically Signed   By: Hulan Saas M.D.   On: 01/29/2015 16:31    EKG: Independently reviewed.  Assessment/Plan Active Problems:   HIV (human immunodeficiency virus infection)   1. HIV - 1. CD4 count ordered 2. Viral load and genotype ordered 3. ID to see patient in AM 4. Influenza PCR has been ordered but doubt that he has flu. 2. Oral thrush - nystatin po ordered    Code Status: Full Code  Family Communication: No  family in room Disposition Plan: Admit to obs   Time spent: 50 min  GARDNER, JARED M. [facility] Pager [phone]  If 7AM-7PM, please contact the day team taking care of the patient [email or web address] Password [facility] 01/29/2015, 8:44 PM

## 2015-01-29 NOTE — Progress Notes (Signed)
Was called by ED PA Fonnie BirkenheadMarisa Sciacca for direct admit from [facility] on patient who is a 49 year old gentleman history of hypertension, tobacco abuse, chest pain who presented to the ED with a 2 - three-week history of increased weight loss, generalized fatigue, malaise, productive cough, diaphoresis, and an upper respiratory symptoms. Patient also noted to have oral thrush. Patient also noted to be newly diagnosed HIV with a positive reactive test. ED PA states she spoke with Dr. Luciana Axeomer of infectious diseases would recommended that patient be admitted for further evaluation. Patient per ED PA will be seen by infectious diseases in the morning in consultation. Patient has been accepted to a MedSurg bed at [facility].

## 2015-01-30 DIAGNOSIS — B370 Candidal stomatitis: Secondary | ICD-10-CM | POA: Diagnosis present

## 2015-01-30 DIAGNOSIS — I10 Essential (primary) hypertension: Secondary | ICD-10-CM

## 2015-01-30 DIAGNOSIS — M109 Gout, unspecified: Secondary | ICD-10-CM | POA: Diagnosis present

## 2015-01-30 LAB — INFLUENZA PANEL BY PCR (TYPE A & B)
H1N1FLUPCR: NOT DETECTED
INFLAPCR: NEGATIVE
Influenza B By PCR: NEGATIVE

## 2015-01-30 LAB — HEPATITIS B SURFACE ANTIGEN: Hepatitis B Surface Ag: NEGATIVE

## 2015-01-30 MED ORDER — NAPROXEN 500 MG PO TABS: 500.0000 mg | ORAL_TABLET | Freq: Two times a day (BID) | ORAL | Status: DC

## 2015-01-30 MED ORDER — SULFAMETHOXAZOLE-TRIMETHOPRIM 800-160 MG PO TABS: 1.0000 | ORAL_TABLET | Freq: Every day | ORAL | Status: DC

## 2015-01-30 MED ORDER — NYSTATIN 100000 UNIT/ML MT SUSP: 5.0000 mL | Freq: Four times a day (QID) | OROMUCOSAL | Status: DC

## 2015-01-30 MED ORDER — NAPROXEN 500 MG PO TABS
500.0000 mg | ORAL_TABLET | Freq: Two times a day (BID) | ORAL | Status: DC
  Administered 2015-01-30: 500 mg via ORAL
  Filled 2015-01-30 (×2): qty 1

## 2015-01-30 NOTE — Discharge Summary (Signed)
Physician Discharge Summary  Brandon Quinn ZOX:[id number] DOB: [birth year] DOA: 01/29/2015  PCP: No PCP Per Patient  Admit date: 01/29/2015 Discharge date: 01/30/2015  Time spent 25 minutes  Recommendations for Outpatient Follow-up:  Discharge home with outpt follow up at [facility] and ID clinic  Discharge Diagnoses:  Principal Problem:   HIV (human immunodeficiency virus infection)  Active Problems:      Acute gouty arthritis   Oral thrush   Hypertension   Tobacco abuse    Discharge Condition: fair  Diet recommendation: low sodium  Filed Weights   01/29/15 1245 01/29/15 1922  Weight: 90.719 kg (200 lb) 82.691 kg (182 lb 4.8 oz)    History of present illness:  Please refer to admission H&P for details, in brief, 49 y.o. male who presents to the ED with 3 year history of cough, worse over the past 3 weeks. He also has had night sweats, weight loss, pain with swallowing, sore throat during that time and ongoing for past couple of months. Had unprotected intercourse 6 months ago and worried about an STD. He was diagnosed and treated with gonorrhea several years back in [place].   Found to have new diagnosis of HIV.  Hospital Course:  Newly diagnosed HIV Likely has constitutional symptoms. Has oral thrush on exam and placed on nystatin. Ordered HIV ab, viral load and CD4 count. CT of the neck done for pain and dysphagia unremarkable except for incidental finding of aortic arch and carotid atherosclerosis. seen by ID and ordered hepatitis panel, RPR and GC probe. Recommend to start bactrim DS daily for PCP prophylaxis until CD4 results available.  plant to start ART when he follows up at ID clinic. Flu PCR negative. Blood cx and other labs sent for HIV should be followed as outpt. Pt stable for d/c home to follow up at [facility]. Pt counseled on protective intercourse and notify sexual  partner( s) who are in  contact.  Acute gouty arthritis of left wrist  pt taking HCTZ for BP which could contribute to acute gout. Will prescribe 5 days of oral naprosyn bid. D/c HCTZ.  Essential HTN  pt follows at [facility]. Resume all home meds except HCTZ. counseled on diet, exercise and smoking cessation.     Procedures:  CT soft tissue neck  Consultations:  ID ( Dr comer)  Discharge Exam: Filed Vitals:   01/30/15 0631  BP: 156/62  Pulse: 63  Temp: 98.9 F (37.2 C)  Resp: 18    General: middle aged male in NAD HEENT: no pallor, moist mucosa, no thrush, no cervical lymphadenopathy Chest: clear b/l, no added sounds Cardiovascular: NS1&S2, no murmurs, rubs or gallop GI: soft, NT, ND, BS+ Ext: mild swelling with tenderness over left wrist. No warmth CNS: alert and oriented  Discharge Instructions    Current Discharge Medication List    START taking these medications   Details  naproxen (NAPROSYN) 500 MG tablet Take 1 tablet (500 mg total) by mouth 2 (two) times daily with a meal. Qty: 10 tablet, Refills: 0    nystatin (MYCOSTATIN) 100000 UNIT/ML suspension Take 5 mLs (500,000 Units total) by mouth 4 (four) times daily. Qty: 180 mL, Refills: 0    sulfamethoxazole-trimethoprim (BACTRIM DS,SEPTRA DS) 800-160 MG per tablet Take 1 tablet by mouth daily. Qty: 30 tablet, Refills: 0      CONTINUE these medications which have NOT CHANGED   Details  amLODipine (NORVASC)  5 MG tablet Take 1 tablet (5 mg total) by mouth daily. Qty: 30 tablet, Refills: 0    carvedilol (COREG) 6.25 MG tablet Take 1 tablet (6.25 mg total) by mouth 2 (two) times daily with a meal. Qty: 60 tablet, Refills: 0      STOP taking these medications     hydrochlorothiazide (HYDRODIURIL) 12.5 MG tablet      naproxen sodium (ANAPROX) 220 MG tablet        No Known Allergies Follow-up Information    Follow up with [facility].   Why:  Monday May 2nd 9:00    Contact information:   [address] [place] [address] [phone]      Follow up with Staci Righter, MD. Call in 2 weeks.   Specialty:  Infectious Diseases   Contact information:   [address] [place] [address] [phone]        The results of significant diagnostics from this hospitalization (including imaging, microbiology, ancillary and laboratory) are listed below for reference.    Significant Diagnostic Studies: Dg Chest 2 View  01/29/2015   CLINICAL DATA:  Cough and congestion  EXAM: CHEST  2 VIEW  COMPARISON:  01/16/2015  FINDINGS: The heart size and mediastinal contours are within normal limits. Both lungs are clear. The visualized skeletal structures are unremarkable.  IMPRESSION: No active cardiopulmonary disease.   Electronically Signed   By: Marlan Palau M.D.   On: 01/29/2015 14:11   Ct Soft Tissue Neck W Contrast  01/29/2015   CLINICAL DATA:  One month history of cough associated with difficulty swallowing and shortness of breath.  EXAM: CT NECK WITH CONTRAST  TECHNIQUE: Multidetector CT imaging of the neck was performed using the standard protocol following the bolus administration of intravenous contrast.  CONTRAST:  80mL OMNIPAQUE IOHEXOL 300 MG/ML IV.  COMPARISON:  None.  FINDINGS: Pharynx and larynx: Normal and symmetric tonsillar tissue in the pharynx. Normal-appearing larynx. Normal-appearing epiglottis. Normal phayrngeal airway.  Salivary glands: Normal-appearing and symmetric parotid and submandibular salivary glands.  Thyroid: Normal in size and appearance without nodularity.  Lymph nodes: No pathologic cervical lymphadenopathy.  Vascular: Marked intimal thickening involving the common carotid arteries bilaterally. Calcified and noncalcified plaque involving the right carotid bulb without stenosis. Minimal atherosclerosis involving the carotid siphons in the brain. Widely patent jugular veins.  Limited intracranial:  Unremarkable.  Visualized orbits: Unremarkable.  Mastoids and visualized paranasal sinuses: Minimal mucosal thickening at the base the right maxillary sinus. Opacification of a solitary right posterior ethmoid air cell. Remaining visualized paranasal sinuses, bilateral mastoid air cells and bilateral middle ear cavities well-aerated.  Skeleton: Degenerative changes at the C1-C2 articulation. No significant skeletal findings otherwise.  Upper chest: Visualized lung apices clear. Residual thymic tissue in the anterior superior mediastinum. Mild aortic arch atherosclerosis.  IMPRESSION: 1. No acute abnormalities involving the neck. 2. Mild aortic arch atherosclerosis, mild atherosclerosis involving the right carotid bulb without stenosis, and marked intimal thickening involving both common carotid arteries.   Electronically Signed   By: Hulan Saas M.D.   On: 01/29/2015 16:31    Microbiology: Recent Results (from the past 240 hour(s))  Rapid strep screen     Status: None   Collection Time: 01/29/15  2:00 PM  Result Value Ref Range Status   Streptococcus, Group A Screen (Direct) NEGATIVE NEGATIVE Final    Comment: (NOTE) A Rapid Antigen test may result negative if the antigen level in the sample  is below the detection level of this test. The FDA has not cleared this test as a stand-alone test therefore the rapid antigen negative result has reflexed to a Group A Strep culture.      Labs: Basic Metabolic Panel:  Recent Labs Lab 01/29/15 1405  NA 134*  K 4.3  CL 101  CO2 30  GLUCOSE 108*  BUN 8  CREATININE 0.82  CALCIUM 8.0*   Liver Function Tests:  Recent Labs Lab 01/29/15 1405  AST 19  ALT 9  ALKPHOS 86  BILITOT 0.6  PROT 8.2  ALBUMIN 2.6*   No results for input(s): LIPASE, AMYLASE in the last 168 hours. No results for input(s): AMMONIA in the last 168 hours. CBC:  Recent Labs Lab 01/29/15 1405  WBC 5.9  NEUTROABS 2.7  HGB 12.4*  HCT 37.7*  MCV 86.3  PLT 352    Cardiac Enzymes: No results for input(s): CKTOTAL, CKMB, CKMBINDEX, TROPONINI in the last 168 hours. BNP: BNP (last 3 results) No results for input(s): BNP in the last 8760 hours.  ProBNP (last 3 results) No results for input(s): PROBNP in the last 8760 hours.  CBG: No results for input(s): GLUCAP in the last 168 hours.     SignedEddie North:  Dayla Gasca  [facility] 01/30/2015, 12:55 PM

## 2015-01-30 NOTE — Consult Note (Signed)
[facility]     Reason for Consult:Newly diagnosed HIV with Thrush   Referring Physician: Dr Julian Reil  Active Problems:   HIV (human immunodeficiency virus infection)   . amLODipine  5 mg Oral Daily  . carvedilol  6.25 mg Oral BID WC  . heparin  5,000 Units Subcutaneous 3 times per day  . nystatin  5 mL Oral QID  . pneumococcal 23 valent vaccine  0.5 mL Intramuscular Tomorrow-1000    Recommendations: -Continue Nystatin -Start bactrim for PCP prophylaxis pending CD4 count  - Follow CD4, genotyping, patient will follow up at [facility] (lives in [place]), start ART at follow up - Add testing for syphilis, GC, chlamydia, hepatitis  Assessment: 49 yo male who presents with sore throat and cough found to have Thrush and newly diagnosed HIV. - Influenza negative - U/A unremarkable.  Antibiotics: None  HPI: Brandon Quinn is a 49 y.o. male with PMH of untreated HTN, who presented to [facility] with 3 weeks of acute on chronic cough (3 years) with associated sore throat, malaise, fatigue, in the ED he was noted to have a white exudate consistent with Thrush and subsequent testing for HIV ab was positive.  He was transferred to [facility] for further evaluation and consultation with ID. He reports his last sexual activity was unprotected about 6-8 months ago, this was unprotected sexual intercourse with a sex Financial controller.  He does report unprotected sexual intercourse with male sex workers every 4-6 months.  He does not have a girlfriend or wife.  He denies sexual intercourse with men.  He denies any IVDA. He is originally from [place], he immigrated from [place] around 2000.  He has been living in [place] since that time.  He does not have family in the area.  He works as a Copy.   Review of Systems: Review of Systems  Constitutional: Positive for chills, weight loss and malaise/fatigue. Negative for fever.  HENT: Positive for sore throat. Negative for  congestion and ear pain.   Eyes: Negative for blurred vision.  Respiratory: Positive for cough. Negative for hemoptysis, sputum production, shortness of breath, wheezing and stridor.   Cardiovascular: Negative for chest pain and leg swelling.  Gastrointestinal: Positive for nausea and vomiting. Negative for heartburn, abdominal pain, diarrhea, constipation and blood in stool.  Genitourinary: Negative for dysuria.  Musculoskeletal: Negative for myalgias and back pain.  Skin: Positive for itching (left arm IV site). Negative for rash.  Neurological: Negative for dizziness and headaches.  Psychiatric/Behavioral: Negative for substance abuse.     Past Medical History  Diagnosis Date  . Hypertension   . Chest pain     a. 07/2012 Lexiscan Sestamibi:  no ischemia/infarct, nl EF.  . Tobacco abuse     History  Substance Use Topics  . Smoking status: Current Every Day Smoker -- 1.00 packs/day for 26 years    Types: Cigarettes  . Smokeless tobacco: Never Used  . Alcohol Use: Yes     Comment: #2 40oz/ day    Family History  Problem Relation Age of Onset  . Other      No known heart disease   No Known Allergies  OBJECTIVE: Blood pressure 156/62, pulse 63, temperature 98.9 F (37.2 C), temperature source Oral, resp. rate 18, height 5' 8.4" (1.737 m), weight 182 lb 4.8 oz (82.691 kg), SpO2 99 %. General: [place]/AT in NAD Skin: no skin lesions Lungs: CTA B/L no W/R/R Cor: RRR no murmur Abdomen: soft  normal bs, nontender MSK: left wrist warm, painful with ROM HEENT: oropharynx clear and moist, no exudate appreciated.  Microbiology: Recent Results (from the past 240 hour(s))  Rapid strep screen     Status: None   Collection Time: 01/29/15  2:00 PM  Result Value Ref Range Status   Streptococcus, Group A Screen (Direct) NEGATIVE NEGATIVE Final    Comment: (NOTE) A Rapid Antigen test may result negative if the antigen level in the sample is below the detection level of this test. The  FDA has not cleared this test as a stand-alone test therefore the rapid antigen negative result has reflexed to a Group A Strep culture.   Imaging: CT soft tissue of the Neck 01/29/15 IMPRESSION: 1. No acute abnormalities involving the neck. 2. Mild aortic arch atherosclerosis, mild atherosclerosis involving the right carotid bulb without stenosis, and marked intimal thickening involving both common carotid arteries.  CXR 4/24 IMPRESSION: No active cardiopulmonary disease. Gust RungErik C Hoffman, DO [facility] PGY-2 Pager [phone] 01/30/2015, 10:38 AM

## 2015-01-30 NOTE — Care Management Note (Signed)
    Page 1 of 1   01/30/2015     2:08:19 PM CARE MANAGEMENT NOTE 01/30/2015  Patient:  Annitta JerseyMOUSSA,Reedy   Account Number:  [id number]  Date Initiated:  01/30/2015  Documentation initiated by:  Lawerance SabalSWIST,DEBBIE  Subjective/Objective Assessment:   PNU, newly Dx HIV, ID consult     Action/Plan:   will follow for dc needs   Anticipated DC Date:  02/01/2015   Anticipated DC Plan:  HOME/SELF CARE  In-house referral  Clinical Social Worker      DC Associate Professorlanning Services  CM consult  [facility]      Choice offered to / List presented to:             Status of service:  Completed, signed off Medicare Important Message given?   (If response is "NO", the following Medicare IM given date fields will be blank) Date Medicare IM given:   Medicare IM given by:   Date Additional Medicare IM given:   Additional Medicare IM given by:    Discharge Disposition:    Per UR Regulation:    If discussed at Long Length of Stay Meetings, dates discussed:    Comments:  will follow for needs, [facility] for Monday 5-2 at 09:00. Lawerance Sabalebbie Swist RN BSN CM

## 2015-01-30 NOTE — Discharge Instructions (Signed)

## 2015-01-30 NOTE — Progress Notes (Signed)
Patient discharge teaching given, including activity, diet, follow-up appoints, and medications. Patient verbalized understanding of all discharge instructions. IV access was d/c'd. Vitals are stable. Skin is intact except as charted in most recent assessments. Pt to be escorted out by NT, to be driven home by family. 

## 2015-01-31 LAB — HIV 1/2 AB DIFFERENTIATION
HIV 1 Ab: REACTIVE — AB
HIV 2 Ab: NONREACTIVE

## 2015-01-31 LAB — CULTURE, GROUP A STREP: Strep A Culture: NEGATIVE

## 2015-01-31 LAB — URINE CULTURE
COLONY COUNT: NO GROWTH
Culture: NO GROWTH

## 2015-01-31 LAB — T-HELPER CELLS (CD4) COUNT (NOT AT ARMC)
CD4 % Helper T Cell: 5 % — ABNORMAL LOW (ref 33–55)
CD4 T Cell Abs: 160 /uL — ABNORMAL LOW (ref 400–2700)

## 2015-01-31 LAB — RPR: RPR Ser Ql: NONREACTIVE

## 2015-01-31 LAB — HIV ANTIBODY (ROUTINE TESTING W REFLEX): HIV Screen 4th Generation wRfx: REACTIVE — AB

## 2015-02-01 ENCOUNTER — Telehealth (HOSPITAL_COMMUNITY): Payer: Self-pay

## 2015-02-01 LAB — CYTOLOGY - NON PAP
CHLAMYDIA, DNA PROBE: NEGATIVE
Neisseria Gonorrhea: NEGATIVE

## 2015-02-01 LAB — HEPATITIS A ANTIBODY, TOTAL: Hep A Total Ab: POSITIVE — AB

## 2015-02-01 LAB — HEPATITIS B SURFACE ANTIBODY,QUALITATIVE: HEP B S AB: POSITIVE — AB

## 2015-02-01 LAB — HEPATITIS C ANTIBODY (REFLEX): HCV AB: NEGATIVE

## 2015-02-01 LAB — HEPATITIS B CORE ANTIBODY, TOTAL: Hep B Core Total Ab: REACTIVE — AB

## 2015-02-02 LAB — HIV-1/2 AB - DIFFERENTIATION

## 2015-02-03 LAB — REFLEX TO GENOSURE(R) MG
HIV GENOSURE(R) MG PDF: UNDETERMINED
HIV GENOSURE: UNDETERMINED
HIV GenoSure(R): UNDETERMINED

## 2015-02-05 LAB — CULTURE, BLOOD (ROUTINE X 2)
Culture: NO GROWTH
Culture: NO GROWTH
SPECIAL REQUESTS: NORMAL
SPECIAL REQUESTS: NORMAL

## 2015-02-06 ENCOUNTER — Inpatient Hospital Stay: Payer: Self-pay | Admitting: Family Medicine

## 2015-02-13 ENCOUNTER — Telehealth (HOSPITAL_COMMUNITY): Payer: Self-pay

## 2015-02-13 ENCOUNTER — Telehealth: Payer: Self-pay

## 2015-02-13 NOTE — Telephone Encounter (Signed)
Patient was recently discharged from the hospital and has not been able to get HIV medications.  He will need to see financial counselor and schedule office visit.  Partial labs done in hospital.  Patient given appointment and transferred to financial counselor.   Laurell Josephsammy K Nevaeh Korte, RN

## 2015-02-23 ENCOUNTER — Ambulatory Visit: Payer: Self-pay | Admitting: Infectious Disease

## 2015-02-24 ENCOUNTER — Telehealth: Payer: Self-pay

## 2015-02-24 NOTE — Telephone Encounter (Signed)
Patient contacted regarding new intake appointment. Date and time given. Information given regarding documents needed to qualify for financial eligibility.  Yahshua Thibault K Orean Giarratano, RN  

## 2015-02-24 NOTE — Telephone Encounter (Signed)
Due to two separate referrals this patient was scheduled for intake.  He was previously scheduled with physician. I will cancel intake.  Message left on his voice mail.   Laurell Josephsammy K Destanie Tibbetts, RN

## 2015-02-27 ENCOUNTER — Ambulatory Visit: Payer: Self-pay | Admitting: Infectious Disease

## 2015-03-02 ENCOUNTER — Ambulatory Visit: Payer: Self-pay

## 2015-03-15 ENCOUNTER — Telehealth: Payer: Self-pay

## 2015-03-15 NOTE — Telephone Encounter (Signed)
Called to speak with patient regarding his scheduled appointment.   He will need to see me for an intake and qualify for financial eligibility prior to seeing the physician.   He has agreed to come for 9:00 am visit. I will cancel physician appointment.   Laurell Josephsammy K Jakwon Gayton, RN

## 2015-03-16 ENCOUNTER — Ambulatory Visit: Payer: Self-pay | Admitting: Internal Medicine

## 2015-03-16 ENCOUNTER — Ambulatory Visit: Payer: Self-pay

## 2015-03-16 ENCOUNTER — Encounter: Payer: Self-pay | Admitting: *Deleted

## 2015-04-25 ENCOUNTER — Ambulatory Visit (INDEPENDENT_AMBULATORY_CARE_PROVIDER_SITE_OTHER): Payer: Self-pay

## 2015-04-25 DIAGNOSIS — Z23 Encounter for immunization: Secondary | ICD-10-CM

## 2015-04-25 DIAGNOSIS — Z113 Encounter for screening for infections with a predominantly sexual mode of transmission: Secondary | ICD-10-CM

## 2015-04-25 DIAGNOSIS — R768 Other specified abnormal immunological findings in serum: Secondary | ICD-10-CM

## 2015-04-25 DIAGNOSIS — B20 Human immunodeficiency virus [HIV] disease: Secondary | ICD-10-CM

## 2015-04-25 LAB — COMPLETE METABOLIC PANEL WITH GFR
ALBUMIN: 3.7 g/dL (ref 3.5–5.2)
ALT: 14 U/L (ref 0–53)
AST: 29 U/L (ref 0–37)
Alkaline Phosphatase: 86 U/L (ref 39–117)
BILIRUBIN TOTAL: 0.5 mg/dL (ref 0.2–1.2)
BUN: 17 mg/dL (ref 6–23)
CALCIUM: 9.7 mg/dL (ref 8.4–10.5)
CO2: 28 mEq/L (ref 19–32)
CREATININE: 0.8 mg/dL (ref 0.50–1.35)
Chloride: 105 mEq/L (ref 96–112)
GFR, Est African American: >89 mL/min
GFR, Est Non African American: >89 mL/min
GLUCOSE: 93 mg/dL (ref 70–99)
Potassium: 4.3 mEq/L (ref 3.5–5.3)
SODIUM: 142 meq/L (ref 135–145)
Total Protein: 8.7 g/dL — ABNORMAL HIGH (ref 6.0–8.3)

## 2015-04-25 LAB — CBC WITH DIFFERENTIAL/PLATELET
BASOS ABS: 0.1 10*3/uL (ref 0.0–0.1)
BASOS PCT: 1 % (ref 0–1)
EOS ABS: 0.3 10*3/uL (ref 0.0–0.7)
Eosinophils Relative: 6 % — ABNORMAL HIGH (ref 0–5)
HEMATOCRIT: 43.6 % (ref 39.0–52.0)
Hemoglobin: 14.6 g/dL (ref 13.0–17.0)
Lymphocytes Relative: 53 % — ABNORMAL HIGH (ref 12–46)
Lymphs Abs: 2.9 10*3/uL (ref 0.7–4.0)
MCH: 28.6 pg (ref 26.0–34.0)
MCHC: 33.5 g/dL (ref 30.0–36.0)
MCV: 85.5 fL (ref 78.0–100.0)
MONO ABS: 0.6 10*3/uL (ref 0.1–1.0)
MPV: 10.4 fL (ref 8.6–12.4)
Monocytes Relative: 10 % (ref 3–12)
NEUTROS ABS: 1.7 10*3/uL (ref 1.7–7.7)
Neutrophils Relative %: 30 % — ABNORMAL LOW (ref 43–77)
Platelets: 176 10*3/uL (ref 150–400)
RBC: 5.1 MIL/uL (ref 4.22–5.81)
RDW: 16.1 % — ABNORMAL HIGH (ref 11.5–15.5)
WBC: 5.5 10*3/uL (ref 4.0–10.5)

## 2015-04-25 MED ORDER — NYSTATIN 100000 UNIT/ML MT SUSP: 5.0000 mL | Freq: Four times a day (QID) | OROMUCOSAL | Status: DC

## 2015-04-26 DIAGNOSIS — R768 Other specified abnormal immunological findings in serum: Secondary | ICD-10-CM | POA: Insufficient documentation

## 2015-04-26 LAB — RPR

## 2015-04-26 NOTE — Progress Notes (Signed)
Patient was referred after a positive rapid HIV test in [facility]  on 01-29-15. He was transferred to [facility] for admission for chest pain, unintentioanl weight loss, diaphoresis, cough and sore throat.  He was discharged with prescriptions which were  not filled since he did not have any money.  He has presented today for intake visit and has scripts in his hand.  He is still having problems swallowing and thrush is present.  Last CD-4 was 160 and he has not been able to fill Septra script. I will call [facility] pharmacy to see if there is any assistance available and also introduce him to a [facility] Case Manager for assistance.  He is seeking housing and currently  sleeping on a couch at a friends house but is not sure how much longer he will be allowed to stay.  Patient states he has not been sexually active in years but records state he has history of having unprotected sex with prostitutes.  He is from [place] and has been residing in the [place].  Hep B Core Antibody Positive 4-16.  No tattoos or piercings.  Pneumonia vaccine given .   Laurell Josephsammy K Caitlynn Ju, RN

## 2015-04-27 LAB — URINE CYTOLOGY ANCILLARY ONLY
CHLAMYDIA, DNA PROBE: NEGATIVE
NEISSERIA GONORRHEA: NEGATIVE

## 2015-04-27 LAB — HIV-1 RNA ULTRAQUANT REFLEX TO GENTYP+
HIV 1 RNA QUANT: 67055 {copies}/mL — AB (ref <20)
HIV-1 RNA QUANT, LOG: 4.83 {Log} — AB (ref <1.30)

## 2015-04-27 LAB — T-HELPER CELL (CD4) - (RCID CLINIC ONLY)
CD4 % Helper T Cell: 5 % — ABNORMAL LOW (ref 33–55)
CD4 T CELL ABS: 160 /uL — AB (ref 400–2700)

## 2015-04-28 LAB — QUANTIFERON TB GOLD ASSAY (BLOOD)
Interferon Gamma Release Assay: NEGATIVE
Mitogen value: >10 IU/mL
Quantiferon Nil Value: 0.08 IU/mL
Quantiferon Tb Ag Minus Nil Value: 0 IU/mL
TB Ag value: 0.06 IU/mL

## 2015-05-02 LAB — HLA B*5701: HLA-B*5701 w/rflx HLA-B High: NEGATIVE

## 2015-05-03 LAB — HIV-1 GENOTYPR PLUS

## 2015-05-18 ENCOUNTER — Ambulatory Visit (INDEPENDENT_AMBULATORY_CARE_PROVIDER_SITE_OTHER): Payer: Self-pay | Admitting: Internal Medicine

## 2015-05-18 ENCOUNTER — Other Ambulatory Visit: Payer: Self-pay | Admitting: *Deleted

## 2015-05-18 ENCOUNTER — Encounter: Payer: Self-pay | Admitting: Internal Medicine

## 2015-05-18 VITALS — BP 188/78 | HR 76 | Temp 98.4°F | Ht 72.0 in | Wt 181.0 lb

## 2015-05-18 DIAGNOSIS — Z720 Tobacco use: Secondary | ICD-10-CM

## 2015-05-18 DIAGNOSIS — B20 Human immunodeficiency virus [HIV] disease: Secondary | ICD-10-CM

## 2015-05-18 DIAGNOSIS — B370 Candidal stomatitis: Secondary | ICD-10-CM

## 2015-05-18 MED ORDER — ELVITEG-COBIC-EMTRICIT-TENOFAF 150-150-200-10 MG PO TABS: 1.0000 | ORAL_TABLET | Freq: Every day | ORAL | Status: DC

## 2015-05-18 MED ORDER — FLUCONAZOLE 100 MG PO TABS: ORAL_TABLET | ORAL | Status: DC

## 2015-05-18 NOTE — Assessment & Plan Note (Signed)
I will start him on oral fluconazole once daily for 7 days and then start prophylactic dosing once weekly.

## 2015-05-18 NOTE — Progress Notes (Signed)
Patient Active Problem List   Diagnosis Date Noted  . HIV disease 05/18/2015    Priority: High  . Acute gouty arthritis 01/30/2015  . Oral thrush 01/30/2015  . Tobacco abuse 07/30/2012  . Hypertension 07/29/2012    Patient's Medications  New Prescriptions   ELVITEGRAVIR-COBICISTAT-EMTRICITABINE-TENOFOVIR (GENVOYA) 150-150-200-10 MG TABS TABLET    Take 1 tablet by mouth daily with breakfast.   FLUCONAZOLE (DIFLUCAN) 100 MG TABLET    Take 1 tablet by mouth daily for 7 days and then 1 tablet each week thereafter  Previous Medications   AMLODIPINE (NORVASC) 5 MG TABLET    Take 1 tablet (5 mg total) by mouth daily.   CARVEDILOL (COREG) 6.25 MG TABLET    Take 1 tablet (6.25 mg total) by mouth 2 (two) times daily with a meal.   SULFAMETHOXAZOLE-TRIMETHOPRIM (BACTRIM DS,SEPTRA DS) 800-160 MG PER TABLET    Take 1 tablet by mouth daily.  Modified Medications   No medications on file  Discontinued Medications   NAPROXEN (NAPROSYN) 500 MG TABLET    Take 1 tablet (500 mg total) by mouth 2 (two) times daily with a meal.   NYSTATIN (MYCOSTATIN) 100000 UNIT/ML SUSPENSION    Take 5 mLs (500,000 Units total) by mouth 4 (four) times daily.    Subjective: Brandon Quinn his in for his initial HIV visit. He was hospitalized several months ago with thrush and acute gout of his wrist and found to be HIV positive. He does not believe he has ever been tested previously. He is a native of [place] and immigrated to the [place] 16 years ago. He believes his risk factor is multiple male partners including commercial sex workers. He has never had a blood transfusion, tattoo and has never injected drugs. He states that he was a little upset after he first learned of his diagnosis but has accepted it and wants to do everything he can to avoid complications. He currently lives here in [place] with a roommate but does not believe that that will cause problems with him taking medication. He is chosen not to tell anyone  other than his healthcare providers about his HIV. He has never been married and has no children. He has no family in [place] and tends to stay pretty much to himself. He works as a Copy. He is not in relationship and has not been sexually active since his diagnosis. He has no history of other sexually transmitted diseases or hepatitis that he knows of. He believes his parents were healthy throughout most of their lives. His father died at age 53 and his mother died at age 43. He has 5 siblings all of whom are healthy. He smokes cigarettes but does not drink alcohol to excess. He does not use any recreational drugs.  Review of Systems: Constitutional: negative for anorexia, chills, fatigue, fevers, sweats and weight loss Eyes: negative Ears, nose, mouth, throat, and face: positive for sore mouth and sore throat, negative for nasal congestion Respiratory: negative Cardiovascular: negative Gastrointestinal: negative Genitourinary:negative Integument/breast: negative  Past Medical History  Diagnosis Date  . Hypertension   . Chest pain     a. 07/2012 Lexiscan Sestamibi:  no ischemia/infarct, nl EF.  . Tobacco abuse   . HIV infection     Social History  Substance Use Topics  . Smoking status: Current Every Day Smoker -- 1.00 packs/day for 26 years    Types: Cigarettes  . Smokeless tobacco: Never Used  . Alcohol Use:  3.0 oz/week    5 Cans of beer per week     Comment: #2 40oz/ day    Family History  Problem Relation Age of Onset  . Other      No known heart disease    No Known Allergies  Objective:  Filed Vitals:   05/18/15 1401  BP: 188/78  Pulse: 76  Temp: 98.4 F (36.9 C)  TempSrc: Oral  Height: 6' (1.829 m)  Weight: 181 lb (82.101 kg)   Body mass index is 24.54 kg/(m^2).  General: he appears healthy and in no distress Oral: positive for thrush. He has some missing teeth. Skin: some excoriated lesions on arms which he states were from bedbugs. He states that  they are improving. Lymph nodes: No palpable adenopathy Lungs: clear Cor: regular S1 and S2 with no murmur Abdomen: soft and nontender with no palpable masses Joints and extremities: normal Neuro: alert with normal speech and conversation Mood: Normal. He does not appear anxious or depressed  Lab Results Lab Results  Component Value Date   WBC 5.5 04/25/2015   HGB 14.6 04/25/2015   HCT 43.6 04/25/2015   MCV 85.5 04/25/2015   PLT 176 04/25/2015    Lab Results  Component Value Date   CREATININE 0.80 04/25/2015   BUN 17 04/25/2015   NA 142 04/25/2015   K 4.3 04/25/2015   CL 105 04/25/2015   CO2 28 04/25/2015    Lab Results  Component Value Date   ALT 14 04/25/2015   AST 29 04/25/2015   ALKPHOS 86 04/25/2015   BILITOT 0.5 04/25/2015    Lab Results  Component Value Date   CHOL 194 07/30/2012   HDL 49 07/30/2012   LDLCALC 128* 07/30/2012   TRIG 86 07/30/2012   CHOLHDL 4.0 07/30/2012    Lab Results HIV 1 RNA QUANT (copies/mL)  Date Value  04/25/2015 67055*   CD4 T CELL ABS (/uL)  Date Value  04/25/2015 160*  01/30/2015 160*     Problem List Items Addressed This Visit      High   HIV disease - Primary    He has newly diagnosed HIV infection with a CD4 count of 160. His infection is complicated by thrush. He is very motivated to start therapy. I reviewed treatment options with him today and he also spoke with our pharmacist, Ulyses Southward. I will go ahead and start him on Genvoya. He will follow-up in 6 weeks for repeat lab work.      Relevant Medications   fluconazole (DIFLUCAN) 100 MG tablet     Unprioritized   Oral thrush    I will start him on oral fluconazole once daily for 7 days and then start prophylactic dosing once weekly.      Relevant Medications   fluconazole (DIFLUCAN) 100 MG tablet   Tobacco abuse    He does not appear ready to consider cigarette cessation at this time.           Cliffton Asters, MD [facility] for Infectious  Disease [facility] [phone] pager   [phone] cell 05/18/2015, 5:35 PM

## 2015-05-18 NOTE — Assessment & Plan Note (Signed)
He has newly diagnosed HIV infection with a CD4 count of 160. His infection is complicated by thrush. He is very motivated to start therapy. I reviewed treatment options with him today and he also spoke with our pharmacist, Ulyses Southward. I will go ahead and start him on Genvoya. He will follow-up in 6 weeks for repeat lab work.

## 2015-05-18 NOTE — Assessment & Plan Note (Signed)
He does not appear ready to consider cigarette cessation at this time.

## 2015-05-25 ENCOUNTER — Other Ambulatory Visit: Payer: Self-pay

## 2015-05-25 ENCOUNTER — Telehealth: Payer: Self-pay

## 2015-05-25 DIAGNOSIS — B370 Candidal stomatitis: Secondary | ICD-10-CM

## 2015-05-25 DIAGNOSIS — I10 Essential (primary) hypertension: Secondary | ICD-10-CM

## 2015-05-25 NOTE — Telephone Encounter (Signed)
Patient only received the Genvoya.  His address was incorrect in the computer and he wonders if medications went to wrong address.  Address corrected.  I will check with [facility] and ito see if  medicaiton was sent.  I will submit orders for Fluconazole, Septra , Norvasc and coreg.   I checked ADAP status and he has not been approved yet.  I am not sure where the Genvoya came from and he is not able to provide me with any information since he is at work.  I will check with Trinna Post, [facility] Case Manager to see if he is able to assist patient with cost of medications and if he knows the source of the [place] script.    Laurell Josephs, RN

## 2015-05-25 NOTE — Telephone Encounter (Signed)
Patient is calling for medication.   His ADAP has not been approved.  He does not have the money.  He has received Genvoya but is not able to give me information on where it came from since he is not at home.  He has appointment with Trinna Post on Friday.  I asked him to take medication and enclosed letter so we can assist him if possible.   At this point he continues to need assistance with medications.   Laurell Josephs, RN

## 2015-05-26 ENCOUNTER — Other Ambulatory Visit: Payer: Self-pay

## 2015-05-26 MED ORDER — CARVEDILOL 6.25 MG PO TABS: 6.2500 mg | ORAL_TABLET | Freq: Two times a day (BID) | ORAL | Status: DC

## 2015-05-26 MED ORDER — AMLODIPINE BESYLATE 5 MG PO TABS: 5.0000 mg | ORAL_TABLET | Freq: Every day | ORAL | Status: DC

## 2015-05-26 MED ORDER — SULFAMETHOXAZOLE-TRIMETHOPRIM 800-160 MG PO TABS: 1.0000 | ORAL_TABLET | Freq: Every day | ORAL | Status: DC

## 2015-05-29 ENCOUNTER — Other Ambulatory Visit: Payer: Self-pay | Admitting: *Deleted

## 2015-05-29 DIAGNOSIS — B20 Human immunodeficiency virus [HIV] disease: Secondary | ICD-10-CM

## 2015-05-29 DIAGNOSIS — B370 Candidal stomatitis: Secondary | ICD-10-CM

## 2015-05-29 DIAGNOSIS — I10 Essential (primary) hypertension: Secondary | ICD-10-CM

## 2015-05-29 MED ORDER — AMLODIPINE BESYLATE 5 MG PO TABS: 5.0000 mg | ORAL_TABLET | Freq: Every day | ORAL | Status: DC

## 2015-05-29 MED ORDER — CARVEDILOL 6.25 MG PO TABS: 6.2500 mg | ORAL_TABLET | Freq: Two times a day (BID) | ORAL | Status: DC

## 2015-05-29 MED ORDER — SULFAMETHOXAZOLE-TRIMETHOPRIM 800-160 MG PO TABS: 1.0000 | ORAL_TABLET | Freq: Every day | ORAL | Status: DC

## 2015-05-29 MED ORDER — FLUCONAZOLE 100 MG PO TABS: ORAL_TABLET | ORAL | Status: DC

## 2015-05-29 NOTE — Telephone Encounter (Signed)
Able to refill other rxes other than HIV at [facility].

## 2015-06-22 ENCOUNTER — Telehealth: Payer: Self-pay

## 2015-06-22 NOTE — Telephone Encounter (Signed)
Rash that has worsened since starting HIV  medications.  Rash present on back and arms.  Dr Orvan Falconer will work patient into schedule on Friday.  Patient informed.   Laurell Josephs, RN

## 2015-06-23 ENCOUNTER — Ambulatory Visit (INDEPENDENT_AMBULATORY_CARE_PROVIDER_SITE_OTHER): Payer: Self-pay | Admitting: Internal Medicine

## 2015-06-23 ENCOUNTER — Encounter: Payer: Self-pay | Admitting: Internal Medicine

## 2015-06-23 VITALS — BP 171/82 | HR 85 | Temp 98.8°F | Wt 175.0 lb

## 2015-06-23 DIAGNOSIS — B20 Human immunodeficiency virus [HIV] disease: Secondary | ICD-10-CM

## 2015-06-23 DIAGNOSIS — R21 Rash and other nonspecific skin eruption: Secondary | ICD-10-CM | POA: Insufficient documentation

## 2015-06-23 DIAGNOSIS — M109 Gout, unspecified: Secondary | ICD-10-CM

## 2015-06-23 MED ORDER — DIPHENHYDRAMINE HCL 25 MG PO TABS: 25.0000 mg | ORAL_TABLET | Freq: Four times a day (QID) | ORAL | Status: DC | PRN

## 2015-06-23 NOTE — Assessment & Plan Note (Signed)
His adherence with Genvoya appears to be excellent but I will stop it today to see if his rash resolves. I will see him back in 3 days. Our lab is closed today so he is unable to get repeat CD4 and viral load. I will consider doing that when he follows up in 3 days.

## 2015-06-23 NOTE — Progress Notes (Signed)
Patient ID: Brandon Quinn, male   DOB: 01/25/1966, 49 y.o.   MRN: [id number]          Patient Active Problem List   Diagnosis Date Noted  . HIV disease 05/18/2015    Priority: High  . Rash 06/23/2015  . Acute gouty arthritis 01/30/2015  . Oral thrush 01/30/2015  . Tobacco abuse 07/30/2012  . Hypertension 07/29/2012    Patient's Medications  New Prescriptions   DIPHENHYDRAMINE (BENADRYL) 25 MG TABLET    Take 1 tablet (25 mg total) by mouth every 6 (six) hours as needed.  Previous Medications   AMLODIPINE (NORVASC) 5 MG TABLET    Take 1 tablet (5 mg total) by mouth daily.   CARVEDILOL (COREG) 6.25 MG TABLET    Take 1 tablet (6.25 mg total) by mouth 2 (two) times daily with a meal.   FLUCONAZOLE (DIFLUCAN) 100 MG TABLET    Take 1 tablet by mouth daily for 7 days and then 1 tablet each week thereafter   SULFAMETHOXAZOLE-TRIMETHOPRIM (BACTRIM DS,SEPTRA DS) 800-160 MG PER TABLET    Take 1 tablet by mouth daily.  Modified Medications   No medications on file  Discontinued Medications   ELVITEGRAVIR-COBICISTAT-EMTRICITABINE-TENOFOVIR (GENVOYA) 150-150-200-10 MG TABS TABLET    Take 1 tablet by mouth daily with breakfast.    Subjective: Brandon Quinn is seen on a work in basis. He started on anti-retroviral therapy with Genvoya one month ago. At the time of that visit he did have excoriated skin lesions that he felt were due to bedbugs. He states that these lesions resolved but then he developed a new rash after beginning Genvoya and fluconazole. He had already been on trimethoprim sulfamethoxazole. He believes that his current rash may be related to [facility]. He does note a moderate amount of itching related to the rash. He also has some swelling around his left great toe. It is not painful. He denies missing any doses of his Genvoya since his last visit.   Review of Systems: Constitutional: negative for anorexia, chills, fevers and sweats Eyes: negative Ears, nose, mouth, throat, and face:  negative for sore mouth and sore throat Gastrointestinal: negative Genitourinary:negative Integument/breast: as noted in history of present illness  Past Medical History  Diagnosis Date  . Hypertension   . Chest pain     a. 07/2012 Lexiscan Sestamibi:  no ischemia/infarct, nl EF.  . Tobacco abuse   . HIV infection     Social History  Substance Use Topics  . Smoking status: Current Every Day Smoker -- 1.00 packs/day for 26 years    Types: Cigarettes  . Smokeless tobacco: Never Used  . Alcohol Use: 3.0 oz/week    5 Cans of beer per week     Comment: #2 40oz/ day    Family History  Problem Relation Age of Onset  . Other      No known heart disease    No Known Allergies  Objective:  Filed Vitals:   06/23/15 1107  BP: 171/82  Pulse: 85  Temp: 98.8 F (37.1 C)  TempSrc: Oral  Weight: 175 lb (79.379 kg)   Body mass index is 23.73 kg/(m^2).  General: He is alert and in no distress Oral: No oropharyngeal lesions Skin: Diffuse excoriated rash on arms and legs with appearance similar to rash he had last month. There is sparing of his face and scalp and relative sparing of his trunk Lungs: Clear Cor: Regular S1 and S2 with no murmurs Abdomen: Soft and nontender Joints and extremities:  Some diffuse swelling of his left great toe without erythema, warmth or any pain with range of motion   Lab Results Lab Results  Component Value Date   WBC 5.5 04/25/2015   HGB 14.6 04/25/2015   HCT 43.6 04/25/2015   MCV 85.5 04/25/2015   PLT 176 04/25/2015    Lab Results  Component Value Date   CREATININE 0.80 04/25/2015   BUN 17 04/25/2015   NA 142 04/25/2015   K 4.3 04/25/2015   CL 105 04/25/2015   CO2 28 04/25/2015    Lab Results  Component Value Date   ALT 14 04/25/2015   AST 29 04/25/2015   ALKPHOS 86 04/25/2015   BILITOT 0.5 04/25/2015    Lab Results  Component Value Date   CHOL 194 07/30/2012   HDL 49 07/30/2012   LDLCALC 128* 07/30/2012   TRIG 86  07/30/2012   CHOLHDL 4.0 07/30/2012    Lab Results HIV 1 RNA QUANT (copies/mL)  Date Value  04/25/2015 40981*   CD4 T CELL ABS (/uL)  Date Value  04/25/2015 160*  01/30/2015 160*     Problem List Items Addressed This Visit      High   HIV disease    His adherence with Genvoya appears to be excellent but I will stop it today to see if his rash resolves. I will see him back in 3 days. Our lab is closed today so he is unable to get repeat CD4 and viral load. I will consider doing that when he follows up in 3 days.      Relevant Orders   T-helper cell (CD4)- ([facility] clinic only)   HIV 1 RNA quant-no reflex-bld     Unprioritized   Acute gouty arthritis    There was initial concern that he might have a flare of his gout causing his left toe swelling but it is completely nontender. I do not think this is acute gout.      Rash - Primary    I'm not absolute certain what is causing his rash. It looks identical to the excoriated rash he had last month before starting Genvoya. However he states that that rash went away before he developed this rash. He does not think that he currently is suffering from bedbug bites. As noted above I will stop his Genvoya to see if the rash resolves. I suggested that he try diphenhydramine as needed for itching.      Relevant Medications   diphenhydrAMINE (BENADRYL) 25 MG tablet        Cliffton Asters, MD  [facility] for Infectious Disease [facility] [phone] pager   [phone] cell 06/23/2015, 12:31 PM

## 2015-06-23 NOTE — Assessment & Plan Note (Signed)
There was initial concern that he might have a flare of his gout causing his left toe swelling but it is completely nontender. I do not think this is acute gout.

## 2015-06-23 NOTE — Assessment & Plan Note (Signed)
I'm not absolute certain what is causing his rash. It looks identical to the excoriated rash he had last month before starting Genvoya. However he states that that rash went away before he developed this rash. He does not think that he currently is suffering from bedbug bites. As noted above I will stop his Genvoya to see if the rash resolves. I suggested that he try diphenhydramine as needed for itching.

## 2015-06-26 ENCOUNTER — Encounter: Payer: Self-pay | Admitting: Internal Medicine

## 2015-06-26 ENCOUNTER — Ambulatory Visit (INDEPENDENT_AMBULATORY_CARE_PROVIDER_SITE_OTHER): Payer: Self-pay | Admitting: Internal Medicine

## 2015-06-26 VITALS — BP 163/84 | HR 89 | Temp 98.6°F | Wt 178.0 lb

## 2015-06-26 DIAGNOSIS — B20 Human immunodeficiency virus [HIV] disease: Secondary | ICD-10-CM

## 2015-06-26 DIAGNOSIS — Z23 Encounter for immunization: Secondary | ICD-10-CM

## 2015-06-26 DIAGNOSIS — R21 Rash and other nonspecific skin eruption: Secondary | ICD-10-CM

## 2015-06-26 MED ORDER — EMTRICITAB-RILPIVIR-TENOFOV AF 200-25-25 MG PO TABS: 1.0000 | ORAL_TABLET | Freq: Every day | ORAL | Status: DC

## 2015-06-26 NOTE — Progress Notes (Signed)
Patient ID: Brandon Quinn, male   DOB: [birth year], 49 y.o.   MRN: [id number] HPI: Brandon Quinn is a 49 y.o. male is here for his visit to evaluate his ART and rash  Allergies: No Known Allergies  Vitals: Temp: 98.6 F (37 C) (09/19 1122) Temp Source: Oral (09/19 1122) BP: 163/84 mmHg (09/19 1122) Pulse Rate: 89 (09/19 1122)  Past Medical History: Past Medical History  Diagnosis Date  . Hypertension   . Chest pain     a. 07/2012 Lexiscan Sestamibi:  no ischemia/infarct, nl EF.  . Tobacco abuse   . HIV infection     Social History: Social History   Social History  . Marital Status: Single    Spouse Name: N/A  . Number of Children: N/A  . Years of Education: N/A   Social History Main Topics  . Smoking status: Current Every Day Smoker -- 1.00 packs/day for 26 years    Types: Cigarettes  . Smokeless tobacco: Never Used  . Alcohol Use: 3.0 oz/week    5 Cans of beer per week     Comment: #2 40oz/ day  . Drug Use: No  . Sexual Activity:    Partners: Female    Pharmacist, hospital Protection: Condom   Other Topics Concern  . None   Social History Narrative    Previous Regimen: None  Current Regimen: Genvoya  Labs: HIV 1 RNA QUANT (copies/mL)  Date Value  04/25/2015 67055*   CD4 T CELL ABS (/uL)  Date Value  04/25/2015 160*  01/30/2015 160*   HEP B S AB (no units)  Date Value  01/30/2015 POSITIVE*   HEPATITIS B SURFACE AG (no units)  Date Value  01/30/2015 NEGATIVE   HCV AB (no units)  Date Value  01/30/2015 NEGATIVE    CrCl: CrCl cannot be calculated (Patient has no serum creatinine result on file.).  Lipids:    Component Value Date/Time   CHOL 194 07/30/2012 0259   TRIG 86 07/30/2012 0259   HDL 49 07/30/2012 0259   CHOLHDL 4.0 07/30/2012 0259   VLDL 17 07/30/2012 0259   LDLCALC 128* 07/30/2012 0259    Assessment: Emmanuelle was recently started on Genvoya for his HIV. Subsequently, he developed some rash. It was stopped to see if the rash would  get better. He stated that the rash has gone completely now. At this point, we are not sure what component is likely to cause the rash. We thought about changing Genvoya to Tivicay + Descovy. Dr Orvan Falconer wants to see if we can change it to [facility] to see if we narrow down whether it's a class effects related.   Recommendations: Dc Wendie Simmer 1 PO qday  Clide Cliff, PharmD Clinical Infectious Disease Pharmacist [facility] for Infectious Disease 06/26/2015, 12:03 PM

## 2015-06-26 NOTE — Assessment & Plan Note (Signed)
It appears that his rash was due to one component of Genvoya, but I am just not sure which component. I will start him on Odefsey and take him off of it agrees inhibitors. He knows to call me if his rash recurs.

## 2015-06-26 NOTE — Progress Notes (Signed)
Patient ID: Brandon Quinn, male   DOB: 1966-09-25, 49 y.o.   MRN: [id number]          Patient Active Problem List   Diagnosis Date Noted  . HIV disease 05/18/2015    Priority: High  . Rash 06/23/2015  . Acute gouty arthritis 01/30/2015  . Oral thrush 01/30/2015  . Tobacco abuse 07/30/2012  . Hypertension 07/29/2012    Patient's Medications  New Prescriptions   EMTRICITABINE-RILPIVIR-TENOFOVIR AF (ODEFSEY) 200-25-25 MG TABS PER TABLET    Take 1 tablet by mouth daily.  Previous Medications   AMLODIPINE (NORVASC) 5 MG TABLET    Take 1 tablet (5 mg total) by mouth daily.   CARVEDILOL (COREG) 6.25 MG TABLET    Take 1 tablet (6.25 mg total) by mouth 2 (two) times daily with a meal.   DIPHENHYDRAMINE (BENADRYL) 25 MG TABLET    Take 1 tablet (25 mg total) by mouth every 6 (six) hours as needed.   FLUCONAZOLE (DIFLUCAN) 100 MG TABLET    Take 1 tablet by mouth daily for 7 days and then 1 tablet each week thereafter   SULFAMETHOXAZOLE-TRIMETHOPRIM (BACTRIM DS,SEPTRA DS) 800-160 MG PER TABLET    Take 1 tablet by mouth daily.  Modified Medications   No medications on file  Discontinued Medications   No medications on file    Subjective: Lavi is in for his routine HIV follow-up visit. He stopped his Genvoya last week after he developed a new rash. He states that his rash is markedly improved over the past 3 days. He has not had any new lesions and his itching has resolved. He has not had any fever, chills or sweats. His appetite remains good and he is feeling well.   Review of Systems: Pertinent items are noted in HPI.  Past Medical History  Diagnosis Date  . Hypertension   . Chest pain     a. 07/2012 Lexiscan Sestamibi:  no ischemia/infarct, nl EF.  . Tobacco abuse   . HIV infection     Social History  Substance Use Topics  . Smoking status: Current Every Day Smoker -- 1.00 packs/day for 26 years    Types: Cigarettes  . Smokeless tobacco: Never Used  . Alcohol Use: 3.0 oz/week     5 Cans of beer per week     Comment: #2 40oz/ day    Family History  Problem Relation Age of Onset  . Other      No known heart disease    No Known Allergies  Objective:  Filed Vitals:   06/26/15 1122  BP: 163/84  Pulse: 89  Temp: 98.6 F (37 C)  TempSrc: Oral  Weight: 178 lb (80.74 kg)   Body mass index is 24.14 kg/(m^2).  General: He is smiling and in good spirits Oral: No oropharyngeal lesions Skin: His rash is resolving Lungs: Clear Cor: Regular S1 and S2 with no murmurs Abdomen: Soft and nontender Joints and extremities: Left great toe swelling has resolved  Lab Results Lab Results  Component Value Date   WBC 5.5 04/25/2015   HGB 14.6 04/25/2015   HCT 43.6 04/25/2015   MCV 85.5 04/25/2015   PLT 176 04/25/2015    Lab Results  Component Value Date   CREATININE 0.80 04/25/2015   BUN 17 04/25/2015   NA 142 04/25/2015   K 4.3 04/25/2015   CL 105 04/25/2015   CO2 28 04/25/2015    Lab Results  Component Value Date   ALT 14 04/25/2015  AST 29 04/25/2015   ALKPHOS 86 04/25/2015   BILITOT 0.5 04/25/2015    Lab Results  Component Value Date   CHOL 194 07/30/2012   HDL 49 07/30/2012   LDLCALC 128* 07/30/2012   TRIG 86 07/30/2012   CHOLHDL 4.0 07/30/2012    Lab Results HIV 1 RNA QUANT (copies/mL)  Date Value  04/25/2015 67055*   CD4 T CELL ABS (/uL)  Date Value  04/25/2015 160*  01/30/2015 160*     Problem List Items Addressed This Visit      High   HIV disease    He is very motivated to get back on anti-retroviral therapy. I'm not sure which component of Genvoya was causing his rash. I reviewed the options with Ulyses Southward, our ID pharmacist. I will start Odefsey once daily with a meal and see him back in one month.      Relevant Medications   emtricitabine-rilpivir-tenofovir AF (ODEFSEY) 200-25-25 MG TABS per tablet     Unprioritized   Rash - Primary    It appears that his rash was due to one component of Genvoya, but I am just  not sure which component. I will start him on Odefsey and take him off of it agrees inhibitors. He knows to call me if his rash recurs.           Cliffton Asters, MD [facility] for Infectious Disease [facility] [phone] pager   [phone] cell 06/26/2015, 12:06 PM

## 2015-06-26 NOTE — Assessment & Plan Note (Signed)
He is very motivated to get back on anti-retroviral therapy. I'm not sure which component of Genvoya was causing his rash. I reviewed the options with Brandon Quinn, our ID pharmacist. I will start Odefsey once daily with a meal and see him back in one month.

## 2015-06-27 DIAGNOSIS — Z23 Encounter for immunization: Secondary | ICD-10-CM

## 2015-06-29 ENCOUNTER — Other Ambulatory Visit: Payer: Self-pay | Admitting: *Deleted

## 2015-06-29 ENCOUNTER — Telehealth: Payer: Self-pay | Admitting: *Deleted

## 2015-06-29 DIAGNOSIS — B370 Candidal stomatitis: Secondary | ICD-10-CM

## 2015-06-29 DIAGNOSIS — I10 Essential (primary) hypertension: Secondary | ICD-10-CM

## 2015-06-29 MED ORDER — SULFAMETHOXAZOLE-TRIMETHOPRIM 800-160 MG PO TABS: 1.0000 | ORAL_TABLET | Freq: Every day | ORAL | Status: DC

## 2015-06-29 MED ORDER — CARVEDILOL 6.25 MG PO TABS: 6.2500 mg | ORAL_TABLET | Freq: Two times a day (BID) | ORAL | Status: DC

## 2015-06-29 MED ORDER — AMLODIPINE BESYLATE 5 MG PO TABS: 5.0000 mg | ORAL_TABLET | Freq: Every day | ORAL | Status: DC

## 2015-06-29 MED ORDER — METOPROLOL TARTRATE 50 MG PO TABS: 50.0000 mg | ORAL_TABLET | Freq: Two times a day (BID) | ORAL | Status: DC

## 2015-06-29 NOTE — Addendum Note (Signed)
Addended by: Jennet Maduro D on: 06/29/2015 03:26 PM   Modules accepted: Orders

## 2015-06-29 NOTE — Telephone Encounter (Signed)
Ben with [employer] [facility] called stating that the carvedilol is not covered by patient's ADAP and he is not able to pay the $50 cost. Metoprolol is covered and if this is ok please provide dosing instructions and send to [facility] on [address]. Wendall Mola

## 2015-06-29 NOTE — Telephone Encounter (Signed)
Thanks, Rx sent to [facility]. Brandon Quinn

## 2015-06-29 NOTE — Telephone Encounter (Signed)
Please change him to metoprolol 50 mg twice daily.

## 2015-07-24 ENCOUNTER — Ambulatory Visit (INDEPENDENT_AMBULATORY_CARE_PROVIDER_SITE_OTHER): Payer: Self-pay | Admitting: Internal Medicine

## 2015-07-24 ENCOUNTER — Encounter: Payer: Self-pay | Admitting: Internal Medicine

## 2015-07-24 VITALS — BP 177/93 | HR 86 | Temp 98.3°F | Wt 178.0 lb

## 2015-07-24 DIAGNOSIS — B20 Human immunodeficiency virus [HIV] disease: Secondary | ICD-10-CM

## 2015-07-24 DIAGNOSIS — I10 Essential (primary) hypertension: Secondary | ICD-10-CM

## 2015-07-24 LAB — BASIC METABOLIC PANEL
BUN: 8 mg/dL (ref 7–25)
CALCIUM: 8.8 mg/dL (ref 8.6–10.3)
CO2: 27 mmol/L (ref 20–31)
CREATININE: 0.97 mg/dL (ref 0.70–1.33)
Chloride: 101 mmol/L (ref 98–110)
GLUCOSE: 88 mg/dL (ref 65–99)
Potassium: 4.5 mmol/L (ref 3.5–5.3)
Sodium: 136 mmol/L (ref 135–146)

## 2015-07-24 NOTE — Assessment & Plan Note (Signed)
It sounds like his adherence to Charlett LangoOdefsey is very good. I will continue it and repeat his lab work today. I'm not sure what is causing his rash. I do not think it is due to his antiretroviral therapy. Although he is not taking his prophylactic fluconazole her trimethoprim sulfamethoxazole correctly I will wait and see if his CD4 count is up over 200, indicating that he no longer needs to be on prophylactic treatments. He will follow-up with me in 3-4 weeks.

## 2015-07-24 NOTE — Assessment & Plan Note (Signed)
I talked to him about the importance of taking his antihypertensive medication on a regular, daily basis.

## 2015-07-24 NOTE — Progress Notes (Signed)
Patient ID: Brandon Quinn, male   DOB: [birth year], 49 y.o.   MRN: [id number]          Patient Active Problem List   Diagnosis Date Noted  . HIV disease (HCC) 05/18/2015    Priority: High  . Rash 06/23/2015  . Acute gouty arthritis 01/30/2015  . Oral thrush 01/30/2015  . Tobacco abuse 07/30/2012  . Hypertension 07/29/2012    Patient's Medications  New Prescriptions   No medications on file  Previous Medications   AMLODIPINE (NORVASC) 5 MG TABLET    Take 1 tablet (5 mg total) by mouth daily.   DIPHENHYDRAMINE (BENADRYL) 25 MG TABLET    Take 1 tablet (25 mg total) by mouth every 6 (six) hours as needed.   EMTRICITABINE-RILPIVIR-TENOFOVIR AF (ODEFSEY) 200-25-25 MG TABS PER TABLET    Take 1 tablet by mouth daily.   FLUCONAZOLE (DIFLUCAN) 100 MG TABLET    Take 1 tablet by mouth daily for 7 days and then 1 tablet each week thereafter   METOPROLOL (LOPRESSOR) 50 MG TABLET    Take 1 tablet (50 mg total) by mouth 2 (two) times daily.   SULFAMETHOXAZOLE-TRIMETHOPRIM (BACTRIM DS,SEPTRA DS) 800-160 MG PER TABLET    Take 1 tablet by mouth daily.  Modified Medications   No medications on file  Discontinued Medications   No medications on file    Subjective: Brandon Quinn is in for his routine HIV follow-up visit. He switched from [place] to [place] on 06/26/2015 after he developed a rash on Genvoya. His rash is better and he does not believe he is having any new lesions but many of the only persist. He has intermittent mild itching related to the rash. He is still smoking cigarettes. He states that he has no problem tolerating the [facility]. He takes it each morning with breakfast. He has not missed any doses. However, he has not in the habit of routinely taking his amlodipine or metoprolol. He thinks he has taken at least 1 dose daily 4 times in the past week. It does not sound like he is taking fluconazole and he is taking his trimethoprim sulfamethoxazole orally once weekly. His appetite is good and he  has not had any fever, chills or sweats.  Review of Systems: Pertinent items are noted in HPI.  Past Medical History  Diagnosis Date  . Hypertension   . Chest pain     a. 07/2012 Lexiscan Sestamibi:  no ischemia/infarct, nl EF.  . Tobacco abuse   . HIV infection [facility])     Social History  Substance Use Topics  . Smoking status: Current Every Day Smoker -- 1.00 packs/day for 26 years    Types: Cigarettes  . Smokeless tobacco: Never Used     Comment: states he is trying to cut back  . Alcohol Use: 3.0 oz/week    5 Cans of beer per week     Comment: #2 40oz/ day    Family History  Problem Relation Age of Onset  . Other      No known heart disease    No Known Allergies  Objective:  Filed Vitals:   07/24/15 1520  BP: 201/99  Pulse: 86  Temp: 98.3 F (36.8 C)  TempSrc: Oral  Weight: 178 lb (80.74 kg)   Body mass index is 24.14 kg/(m^2).  General: He is smiling and in good spirits Oral: No oropharyngeal lesions Skin: Multiple excoriated, papules on his arms Lungs: Clear Cor: Regular S1 and S2 with no murmurs   Lab  Results Lab Results  Component Value Date   WBC 5.5 04/25/2015   HGB 14.6 04/25/2015   HCT 43.6 04/25/2015   MCV 85.5 04/25/2015   PLT 176 04/25/2015    Lab Results  Component Value Date   CREATININE 0.80 04/25/2015   BUN 17 04/25/2015   NA 142 04/25/2015   K 4.3 04/25/2015   CL 105 04/25/2015   CO2 28 04/25/2015    Lab Results  Component Value Date   ALT 14 04/25/2015   AST 29 04/25/2015   ALKPHOS 86 04/25/2015   BILITOT 0.5 04/25/2015    Lab Results  Component Value Date   CHOL 194 07/30/2012   HDL 49 07/30/2012   LDLCALC 128* 07/30/2012   TRIG 86 07/30/2012   CHOLHDL 4.0 07/30/2012    Lab Results HIV 1 RNA QUANT (copies/mL)  Date Value  04/25/2015 67055*   CD4 T CELL ABS (/uL)  Date Value  04/25/2015 160*  01/30/2015 160*     Problem List Items Addressed This Visit      High   HIV disease (HCC)    It sounds  like his adherence to Charlett LangoOdefsey is very good. I will continue it and repeat his lab work today. I'm not sure what is causing his rash. I do not think it is due to his antiretroviral therapy. Although he is not taking his prophylactic fluconazole her trimethoprim sulfamethoxazole correctly I will wait and see if his CD4 count is up over 200, indicating that he no longer needs to be on prophylactic treatments. He will follow-up with me in 3-4 weeks.      Relevant Orders   T-helper cell (CD4)- ([facility] clinic only)   HIV 1 RNA quant-no reflex-bld   Basic metabolic panel     Unprioritized   Hypertension - Primary    I talked to him about the importance of taking his antihypertensive medication on a regular, daily basis.           Cliffton AstersJohn Mosella Kasa, MD [facility] [phone] pager   [phone] cell 07/24/2015, 3:43 PM

## 2015-07-25 LAB — T-HELPER CELL (CD4) - (RCID CLINIC ONLY)
CD4 T CELL ABS: 160 /uL — AB (ref 400–2700)
CD4 T CELL HELPER: 6 % — AB (ref 33–55)

## 2015-07-27 LAB — HIV-1 RNA QUANT-NO REFLEX-BLD
HIV 1 RNA Quant: 1078 copies/mL — ABNORMAL HIGH (ref <20)
HIV-1 RNA Quant, Log: 3.03 Log copies/mL — ABNORMAL HIGH (ref <1.30)

## 2015-09-06 ENCOUNTER — Encounter: Payer: Self-pay | Admitting: Internal Medicine

## 2015-09-06 ENCOUNTER — Ambulatory Visit (INDEPENDENT_AMBULATORY_CARE_PROVIDER_SITE_OTHER): Payer: Self-pay | Admitting: Internal Medicine

## 2015-09-06 VITALS — BP 171/84 | HR 69 | Temp 98.1°F | Wt 181.0 lb

## 2015-09-06 DIAGNOSIS — I10 Essential (primary) hypertension: Secondary | ICD-10-CM

## 2015-09-06 DIAGNOSIS — B20 Human immunodeficiency virus [HIV] disease: Secondary | ICD-10-CM

## 2015-09-06 DIAGNOSIS — R21 Rash and other nonspecific skin eruption: Secondary | ICD-10-CM

## 2015-09-06 DIAGNOSIS — Z720 Tobacco use: Secondary | ICD-10-CM

## 2015-09-06 NOTE — Assessment & Plan Note (Signed)
His blood pressure remains above goal. I will continue his current medications but see him back in one month to consider changes in therapy.

## 2015-09-06 NOTE — Progress Notes (Signed)
Patient Active Problem List   Diagnosis Date Noted  . HIV disease (HCC) 05/18/2015    Priority: High  . Rash 06/23/2015  . Acute gouty arthritis 01/30/2015  . Oral thrush 01/30/2015  . Tobacco abuse 07/30/2012  . Hypertension 07/29/2012    Patient's Medications  New Prescriptions   No medications on file  Previous Medications   AMLODIPINE (NORVASC) 5 MG TABLET    Take 1 tablet (5 mg total) by mouth daily.   EMTRICITABINE-RILPIVIR-TENOFOVIR AF (ODEFSEY) 200-25-25 MG TABS PER TABLET    Take 1 tablet by mouth daily.   METOPROLOL (LOPRESSOR) 50 MG TABLET    Take 1 tablet (50 mg total) by mouth 2 (two) times daily.   SULFAMETHOXAZOLE-TRIMETHOPRIM (BACTRIM DS,SEPTRA DS) 800-160 MG PER TABLET    Take 1 tablet by mouth daily.  Modified Medications   No medications on file  Discontinued Medications   DIPHENHYDRAMINE (BENADRYL) 25 MG TABLET    Take 1 tablet (25 mg total) by mouth every 6 (six) hours as needed.   FLUCONAZOLE (DIFLUCAN) 100 MG TABLET    Take 1 tablet by mouth daily for 7 days and then 1 tablet each week thereafter    Subjective: Brandon Quinn is in for his routine HIV follow-up visit. He denies missing any doses of his Odefsey. He takes it each morning but not always with a full meal. He does not always eat breakfast each morning. He does always eat lunch and dinner. He has home when he eats his dinner meal. His rash is healing steadily. He is cutting down on his cigarettes and has been thinking about trying to quit as his New Year's resolution. He is still taking his pneumocystis prophylaxis and blood pressure medications correctly.   Review of Systems: Review of Systems  Constitutional: Negative for fever, chills, weight loss, malaise/fatigue and diaphoresis.  HENT: Negative for sore throat.   Respiratory: Negative for cough, sputum production and shortness of breath.   Cardiovascular: Negative for chest pain.  Gastrointestinal: Negative for nausea, vomiting and  diarrhea.  Genitourinary: Negative for dysuria and frequency.  Musculoskeletal: Negative for myalgias and joint pain.  Skin: Positive for rash.       Lesions on his arms are healing slowly. No new lesions are noted.  Neurological: Negative for focal weakness.  Psychiatric/Behavioral: Negative for depression and substance abuse. The patient is not nervous/anxious.     Past Medical History  Diagnosis Date  . Hypertension   . Chest pain     a. 07/2012 Lexiscan Sestamibi:  no ischemia/infarct, nl EF.  . Tobacco abuse   . HIV infection [facility](HCC)     Social History  Substance Use Topics  . Smoking status: Current Every Day Smoker -- 1.00 packs/day for 26 years    Types: Cigarettes  . Smokeless tobacco: Never Used     Comment: states he is trying to cut back  . Alcohol Use: 3.0 oz/week    5 Cans of beer per week     Comment: #2 40oz/ day    Family History  Problem Relation Age of Onset  . Other      No known heart disease    No Known Allergies  Objective:  Filed Vitals:   09/06/15 1610  BP: 171/84  Pulse: 69  Temp: 98.1 F (36.7 C)  TempSrc: Oral  Weight: 181 lb (82.101 kg)   Body mass index is 24.54 kg/(m^2).  Physical Exam  Constitutional: He is oriented  to person, place, and time.  He is smiling and in good spirits.  HENT:  Mouth/Throat: No oropharyngeal exudate.  Eyes: Conjunctivae are normal.  Cardiovascular: Normal rate and regular rhythm.   No murmur heard. Pulmonary/Chest: Breath sounds normal.  Abdominal: Soft. He exhibits no mass. There is no tenderness.  Musculoskeletal: Normal range of motion.  Neurological: He is alert and oriented to person, place, and time.  Skin: Rash noted.  Psychiatric: Mood and affect normal.    Lab Results Lab Results  Component Value Date   WBC 5.5 04/25/2015   HGB 14.6 04/25/2015   HCT 43.6 04/25/2015   MCV 85.5 04/25/2015   PLT 176 04/25/2015    Lab Results  Component Value Date   CREATININE 0.97 07/24/2015    BUN 8 07/24/2015   NA 136 07/24/2015   K 4.5 07/24/2015   CL 101 07/24/2015   CO2 27 07/24/2015    Lab Results  Component Value Date   ALT 14 04/25/2015   AST 29 04/25/2015   ALKPHOS 86 04/25/2015   BILITOT 0.5 04/25/2015    Lab Results  Component Value Date   CHOL 194 07/30/2012   HDL 49 07/30/2012   LDLCALC 128* 07/30/2012   TRIG 86 07/30/2012   CHOLHDL 4.0 07/30/2012    Lab Results HIV 1 RNA QUANT (copies/mL)  Date Value  07/24/2015 1078*  04/25/2015 67055*   CD4 T CELL ABS (/uL)  Date Value  07/24/2015 160*  04/25/2015 160*  01/30/2015 160*      Problem List Items Addressed This Visit      High   HIV disease (HCC)    His viral load had come down to just above 1000 one month ago. I will repeat his lab work today and have him continue Odefsey but I asked him to take it each evening with dinner. He will follow-up in one month      Relevant Orders   T-helper cell (CD4)- ([facility] clinic only)   HIV 1 RNA quant-no reflex-bld     Unprioritized   Hypertension - Primary    His blood pressure remains above goal. I will continue his current medications but see him back in one month to consider changes in therapy.      Rash    The rash that he developed while taking Genvoya is slowly fading.      Tobacco abuse    I encouraged him to go through with this plan to quit. I asked him to firm up his quit date and let all of his friends and family no of his plan to quit and to ask them for their support. He will follow-up in one month.           Cliffton Asters, MD [facility] [phone] pager   [phone] cell 09/06/2015, 4:25 PM

## 2015-09-06 NOTE — Assessment & Plan Note (Signed)
His viral load had come down to just above 1000 one month ago. I will repeat his lab work today and have him continue Odefsey but I asked him to take it each evening with dinner. He will follow-up in one month

## 2015-09-06 NOTE — Assessment & Plan Note (Signed)
I encouraged him to go through with this plan to quit. I asked him to firm up his quit date and let all of his friends and family no of his plan to quit and to ask them for their support. He will follow-up in one month.

## 2015-09-06 NOTE — Assessment & Plan Note (Signed)
The rash that he developed while taking Genvoya is slowly fading.

## 2015-09-07 LAB — HIV-1 RNA QUANT-NO REFLEX-BLD: HIV-1 RNA Quant, Log: <1.3 Log copies/mL (ref <1.30)

## 2015-09-08 LAB — T-HELPER CELL (CD4) - (RCID CLINIC ONLY)
CD4 % Helper T Cell: 6 % — ABNORMAL LOW (ref 33–55)
CD4 T CELL ABS: 180 /uL — AB (ref 400–2700)

## 2015-11-28 ENCOUNTER — Other Ambulatory Visit: Payer: Self-pay | Admitting: Internal Medicine

## 2015-11-28 DIAGNOSIS — B20 Human immunodeficiency virus [HIV] disease: Secondary | ICD-10-CM

## 2016-01-04 ENCOUNTER — Other Ambulatory Visit: Payer: Self-pay | Admitting: Internal Medicine

## 2016-01-04 DIAGNOSIS — B20 Human immunodeficiency virus [HIV] disease: Secondary | ICD-10-CM

## 2016-01-09 ENCOUNTER — Ambulatory Visit (INDEPENDENT_AMBULATORY_CARE_PROVIDER_SITE_OTHER): Payer: Self-pay | Admitting: Internal Medicine

## 2016-01-09 ENCOUNTER — Encounter: Payer: Self-pay | Admitting: Internal Medicine

## 2016-01-09 VITALS — BP 169/75 | HR 87 | Temp 98.7°F | Wt 198.0 lb

## 2016-01-09 DIAGNOSIS — B20 Human immunodeficiency virus [HIV] disease: Secondary | ICD-10-CM

## 2016-01-09 DIAGNOSIS — I10 Essential (primary) hypertension: Secondary | ICD-10-CM

## 2016-01-09 DIAGNOSIS — R21 Rash and other nonspecific skin eruption: Secondary | ICD-10-CM

## 2016-01-09 DIAGNOSIS — Z113 Encounter for screening for infections with a predominantly sexual mode of transmission: Secondary | ICD-10-CM

## 2016-01-09 LAB — CBC
HCT: 43.7 % (ref 38.5–50.0)
HEMOGLOBIN: 15 g/dL (ref 13.2–17.1)
MCH: 30.8 pg (ref 27.0–33.0)
MCHC: 34.3 g/dL (ref 32.0–36.0)
MCV: 89.7 fL (ref 80.0–100.0)
MPV: 10 fL (ref 7.5–12.5)
Platelets: 192 10*3/uL (ref 140–400)
RBC: 4.87 MIL/uL (ref 4.20–5.80)
RDW: 14.7 % (ref 11.0–15.0)
WBC: 8.5 10*3/uL (ref 3.8–10.8)

## 2016-01-09 LAB — COMPREHENSIVE METABOLIC PANEL
ALBUMIN: 3.7 g/dL (ref 3.6–5.1)
ALT: 11 U/L (ref 9–46)
AST: 20 U/L (ref 10–35)
Alkaline Phosphatase: 78 U/L (ref 40–115)
BUN: 8 mg/dL (ref 7–25)
CHLORIDE: 103 mmol/L (ref 98–110)
CO2: 26 mmol/L (ref 20–31)
CREATININE: 0.87 mg/dL (ref 0.70–1.33)
Calcium: 8.8 mg/dL (ref 8.6–10.3)
Glucose, Bld: 85 mg/dL (ref 65–99)
Potassium: 4.3 mmol/L (ref 3.5–5.3)
SODIUM: 136 mmol/L (ref 135–146)
Total Bilirubin: 0.5 mg/dL (ref 0.2–1.2)
Total Protein: 8 g/dL (ref 6.1–8.1)

## 2016-01-09 LAB — LIPID PANEL
CHOL/HDL RATIO: 5.4 ratio — AB (ref <=5.0)
CHOLESTEROL: 250 mg/dL — AB (ref 125–200)
HDL: 46 mg/dL (ref >=40)
LDL CALC: 158 mg/dL — AB (ref <130)
TRIGLYCERIDES: 232 mg/dL — AB (ref <150)
VLDL: 46 mg/dL — AB (ref <30)

## 2016-01-09 NOTE — Assessment & Plan Note (Addendum)
BP elevated in clinic today.  However, patient states he is taking his medications regularly.  Patient has gained roughly 17 pounds since last visit.  Discussed low fat/sodium diet and incorporating physical activity for maintenance of blood pressure and reduction of heart disease risk.  Will follow-up with PCP.

## 2016-01-09 NOTE — Assessment & Plan Note (Signed)
Patient is doing well today.  He remains compliant with his ART regimen.  Patient not sexually active.  Condoms offered, but patient refused.  He will have labs done today. RTC in 6 months

## 2016-01-09 NOTE — Progress Notes (Signed)
   Subjective:    Patient ID: Annitta JerseyMoussa Luffman, male    DOB: 04-11-1966, 50 y.o.   MRN: [id number]  HPI  Mr. Royetta CarGambo is a 50 year old male diagnosed with HIV in 2016.  He was initially started on Genvoya; however, developed a rash.  Therefore, patient is currently on Odefsey.  He denies any problems receiving his medications.  Additionally, he denies any side effects and missed doses.  He takes his Odefsey with meals on most occassions.  He is in good spirits and is in clinic today for routine follow-up.  Review of Systems  Constitutional: Negative for fever and appetite change.  HENT: Negative for mouth sores.   Respiratory: Negative for cough and shortness of breath.   Cardiovascular: Negative for chest pain and leg swelling.  Gastrointestinal: Negative for abdominal pain, diarrhea and constipation.  Genitourinary: Negative for dysuria and discharge.  Skin: Negative for rash.  Neurological: Negative for headaches.      Objective:   Physical Exam  Constitutional: He is oriented to person, place, and time. He appears well-developed and well-nourished.  HENT:  Mouth/Throat: No oropharyngeal exudate.  Eyes: Pupils are equal, round, and reactive to light.  Cardiovascular: Normal rate, regular rhythm and normal heart sounds.   No murmur heard. Pulmonary/Chest: Effort normal and breath sounds normal. He has no wheezes.  Abdominal: Soft. Bowel sounds are normal. He exhibits no distension. There is no tenderness.  Lymphadenopathy:    He has no cervical adenopathy.  Neurological: He is alert and oriented to person, place, and time.  Skin: Skin is warm and dry. No rash noted.  Psychiatric: He has a normal mood and affect.      Assessment & Plan:  I have seen and examined Chalmer today with Pershing CoxJose Ramon Sheena Donegan, RN. Jola BaptistMoussa is doing very well and not missing doses of his Odefsey. I will repeat lab work today and have him follow-up in 3 months.  Cliffton AstersJohn Campbell, MD [facility] [phone] pager   [phone] cell 01/09/2016, 5:09 PM

## 2016-01-10 LAB — T-HELPER CELL (CD4) - (RCID CLINIC ONLY)
CD4 % Helper T Cell: 5 % — ABNORMAL LOW (ref 33–55)
CD4 T Cell Abs: 220 /uL — ABNORMAL LOW (ref 400–2700)

## 2016-01-10 LAB — HIV-1 RNA QUANT-NO REFLEX-BLD
HIV 1 RNA Quant: <20 copies/mL (ref <20)
HIV-1 RNA Quant, Log: <1.3 Log copies/mL (ref <1.30)

## 2016-01-10 LAB — RPR

## 2016-01-18 ENCOUNTER — Emergency Department (HOSPITAL_COMMUNITY): Payer: Self-pay

## 2016-01-18 ENCOUNTER — Emergency Department (HOSPITAL_COMMUNITY)
Admission: EM | Admit: 2016-01-18 | Discharge: 2016-01-18 | Disposition: A | Discharge status: Home / Self Care | Payer: Self-pay | Attending: Emergency Medicine | Admitting: Emergency Medicine

## 2016-01-18 ENCOUNTER — Encounter (HOSPITAL_COMMUNITY): Payer: Self-pay | Admitting: Emergency Medicine

## 2016-01-18 DIAGNOSIS — Z792 Long term (current) use of antibiotics: Secondary | ICD-10-CM | POA: Insufficient documentation

## 2016-01-18 DIAGNOSIS — I10 Essential (primary) hypertension: Secondary | ICD-10-CM | POA: Insufficient documentation

## 2016-01-18 DIAGNOSIS — F17210 Nicotine dependence, cigarettes, uncomplicated: Secondary | ICD-10-CM | POA: Insufficient documentation

## 2016-01-18 DIAGNOSIS — R55 Syncope and collapse: Secondary | ICD-10-CM

## 2016-01-18 DIAGNOSIS — E860 Dehydration: Secondary | ICD-10-CM

## 2016-01-18 DIAGNOSIS — Z79899 Other long term (current) drug therapy: Secondary | ICD-10-CM | POA: Insufficient documentation

## 2016-01-18 DIAGNOSIS — R000 Tachycardia, unspecified: Secondary | ICD-10-CM | POA: Insufficient documentation

## 2016-01-18 DIAGNOSIS — B20 Human immunodeficiency virus [HIV] disease: Secondary | ICD-10-CM | POA: Insufficient documentation

## 2016-01-18 LAB — CBC WITH DIFFERENTIAL/PLATELET
BASOS ABS: 0 10*3/uL (ref 0.0–0.1)
BASOS PCT: 1 %
Eosinophils Absolute: 0.1 10*3/uL (ref 0.0–0.7)
Eosinophils Relative: 2 %
HEMATOCRIT: 43.4 % (ref 39.0–52.0)
HEMOGLOBIN: 14.3 g/dL (ref 13.0–17.0)
Lymphocytes Relative: 43 %
Lymphs Abs: 2.6 10*3/uL (ref 0.7–4.0)
MCH: 29.9 pg (ref 26.0–34.0)
MCHC: 32.9 g/dL (ref 30.0–36.0)
MCV: 90.6 fL (ref 78.0–100.0)
Monocytes Absolute: 0.6 10*3/uL (ref 0.1–1.0)
Monocytes Relative: 9 %
NEUTROS ABS: 2.7 10*3/uL (ref 1.7–7.7)
NEUTROS PCT: 45 %
Platelets: 184 10*3/uL (ref 150–400)
RBC: 4.79 MIL/uL (ref 4.22–5.81)
RDW: 14.2 % (ref 11.5–15.5)
WBC: 6 10*3/uL (ref 4.0–10.5)

## 2016-01-18 LAB — COMPREHENSIVE METABOLIC PANEL
ALBUMIN: 3.3 g/dL — AB (ref 3.5–5.0)
ALK PHOS: 78 U/L (ref 38–126)
ALT: 17 U/L (ref 17–63)
AST: 32 U/L (ref 15–41)
Anion gap: 10 (ref 5–15)
BILIRUBIN TOTAL: 0.6 mg/dL (ref 0.3–1.2)
BUN: 8 mg/dL (ref 6–20)
CO2: 23 mmol/L (ref 22–32)
Calcium: 8.7 mg/dL — ABNORMAL LOW (ref 8.9–10.3)
Chloride: 103 mmol/L (ref 101–111)
Creatinine, Ser: 0.98 mg/dL (ref 0.61–1.24)
GFR calc Af Amer: >60 mL/min (ref >60)
GFR calc non Af Amer: >60 mL/min (ref >60)
GLUCOSE: 102 mg/dL — AB (ref 65–99)
Potassium: 4 mmol/L (ref 3.5–5.1)
SODIUM: 136 mmol/L (ref 135–145)
TOTAL PROTEIN: 8.3 g/dL — AB (ref 6.5–8.1)

## 2016-01-18 MED ORDER — ONDANSETRON HCL 4 MG/2ML IJ SOLN
4.0000 mg | Freq: Once | INTRAMUSCULAR | Status: AC
  Administered 2016-01-18: 4 mg via INTRAVENOUS
  Filled 2016-01-18: qty 2

## 2016-01-18 MED ORDER — SODIUM CHLORIDE 0.9 % IV BOLUS (SEPSIS)
1000.0000 mL | Freq: Once | INTRAVENOUS | Status: AC
  Administered 2016-01-18: 1000 mL via INTRAVENOUS

## 2016-01-18 NOTE — ED Provider Notes (Addendum)
CSN: [id number]     Arrival date & time 01/18/16  0940 History   First MD Initiated Contact with Patient 01/18/16 1009     Chief Complaint  Patient presents with  . Dizziness  . Fatigue     (Consider location/radiation/quality/duration/timing/severity/associated sxs/prior Treatment) HPI Comments: Patient is a 50 year old male with a history of hypertension, HIV and tobacco abuse presenting today with a near syncopal event.  Patient states he drank a case of alcohol yesterday between noon and 6:00. This morning when he woke up he did not feel great for the time he got to the bank he started to feel very shaky, lightheaded and nauseated. He states his vision started to get blurry.  No chest pain, palpitations, or abd pain during event.  Currently states the symptoms are gone he just feels general malaise. Not eat much and has not drank much fluid since yesterday.  Patient is a 50 y.o. male presenting with dizziness. The history is provided by the patient.  Dizziness   Past Medical History  Diagnosis Date  . Hypertension   . Chest pain     a. 07/2012 Lexiscan Sestamibi:  no ischemia/infarct, nl EF.  . Tobacco abuse   . HIV infection [facility](HCC)    Past Surgical History  Procedure Laterality Date  . Wrist fracture surgery  ~ 2009    right; "put a plate in it" (14/78/295610/24/2013)   Family History  Problem Relation Age of Onset  . Other      No known heart disease   Social History  Substance Use Topics  . Smoking status: Current Every Day Smoker -- 1.00 packs/day for 26 years    Types: Cigarettes  . Smokeless tobacco: Never Used     Comment: states he is trying to cut back  . Alcohol Use: 3.0 oz/week    5 Cans of beer per week     Comment: #2 40oz/ day    Review of Systems  Neurological: Positive for dizziness.  All other systems reviewed and are negative.     Allergies  Genvoya  Home Medications   Prior to Admission medications   Medication Sig Start Date End Date Taking?  Authorizing Provider  amLODipine (NORVASC) 5 MG tablet Take 1 tablet (5 mg total) by mouth daily. 06/29/15   Cliffton AstersJohn Campbell, MD  emtricitabine-rilpivir-tenofovir AF (ODEFSEY) 200-25-25 MG TABS per tablet Take 1 tablet by mouth daily. 06/26/15   Cliffton AstersJohn Campbell, MD  metoprolol (LOPRESSOR) 50 MG tablet Take 1 tablet (50 mg total) by mouth 2 (two) times daily. 06/29/15   Cliffton AstersJohn Campbell, MD  sulfamethoxazole-trimethoprim (BACTRIM DS,SEPTRA DS) 800-160 MG tablet TAKE 1 TABLET BY MOUTH DAILY 01/04/16   Cliffton AstersJohn Campbell, MD   BP 184/84 mmHg  Pulse 102  Temp(Src) 98 F (36.7 C) (Oral)  Resp 18  Ht 5\' 9"  (1.753 m)  Wt 185 lb (83.915 kg)  BMI 27.31 kg/m2  SpO2 99% Physical Exam  Constitutional: He is oriented to person, place, and time. He appears well-developed and well-nourished. No distress.  HENT:  Head: Normocephalic and atraumatic.  Mouth/Throat: Oropharynx is clear and moist.  Eyes: Conjunctivae and EOM are normal. Pupils are equal, round, and reactive to light.  Neck: Normal range of motion. Neck supple.  Cardiovascular: Regular rhythm and intact distal pulses.  Tachycardia present.   No murmur heard. Pulmonary/Chest: Effort normal and breath sounds normal. No respiratory distress. He has no wheezes. He has no rales.  Abdominal: Soft. He exhibits no distension. There is no  tenderness. There is no rebound and no guarding.  Musculoskeletal: Normal range of motion. He exhibits no edema or tenderness.  Neurological: He is alert and oriented to person, place, and time.  Skin: Skin is warm and dry. No rash noted. No erythema.  Psychiatric: He has a normal mood and affect. His behavior is normal.  Nursing note and vitals reviewed.   ED Course  Procedures (including critical care time) Labs Review Labs Reviewed  COMPREHENSIVE METABOLIC PANEL - Abnormal; Notable for the following:    Glucose, Bld 102 (*)    Calcium 8.7 (*)    Total Protein 8.3 (*)    Albumin 3.3 (*)    All other components within  normal limits  CBC WITH DIFFERENTIAL/PLATELET    Imaging Review Dg Chest 2 View  01/18/2016  CLINICAL DATA:  Near syncope EXAM: CHEST  2 VIEW COMPARISON:  01/29/2015 FINDINGS: The heart size and mediastinal contours are within normal limits. Both lungs are clear. The visualized skeletal structures are unremarkable. IMPRESSION: No active cardiopulmonary disease. Electronically Signed   By: Marlan Palau M.D.   On: 01/18/2016 11:20   I have personally reviewed and evaluated these images and lab results as part of my medical decision-making.   EKG Interpretation   Date/Time:  Thursday January 18 2016 09:43:23 EDT Ventricular Rate:  103 PR Interval:  173 QRS Duration: 101 QT Interval:  339 QTC Calculation: 444 R Axis:   2 Text Interpretation:  Sinus tachycardia Abnormal R-wave progression, early  transition Probable LVH with secondary repol abnrm No significant change  since last tracing Confirmed by Anitra Lauth  MD, Alphonzo Lemmings ([id number]) on 01/18/2016  9:46:54 AM      MDM   Final diagnoses:  Near syncope  Dehydration    Patient is a 50 year old male with a history of hypertension, HIV and tobacco abuse presenting today with a near syncopal event. Patient's syncopal event most likely today is from dehydration from excessive alcohol use yesterday. He ate a piece of bread and had a soda this morning but has had no other fluids. Symptoms improved with lying down. He had no strokelike symptoms, chest discomfort, unilateral leg pain or swelling. Low suspicion for massive PE, cardiac disease. Patient does have an abnormal EKG however it is unchanged today. We'll give IV fluids. CBC, CMP and chest x-ray pending  11:51 AM Labs and imaging within normal limits. On repeat evaluation after IV fluids patient feels much better. Blood pressure remains elevated at 169/75. Discussed with patient checking his blood pressure over the next week and if it remains elevated he needs to follow-up with his doctor for  adjustment of his medication. Patient was able to ablate in the department without difficulty and states he's feeling much better.  Gwyneth Sprout, MD 01/18/16 1152  Gwyneth Sprout, MD 01/18/16 1154

## 2016-01-18 NOTE — ED Notes (Signed)
Pt. States he drunk a lot of beer last night. Pt. States he woke up this morning feeling bad. Pt. Complains of dizziness and generalized weakness. Pt. States he almost felt like he might pass out, so he called EMS. Pt. Denies any chest pain or SOB at this time.

## 2016-02-13 ENCOUNTER — Other Ambulatory Visit: Payer: Self-pay | Admitting: Internal Medicine

## 2016-02-15 ENCOUNTER — Other Ambulatory Visit: Payer: Self-pay | Admitting: *Deleted

## 2016-02-15 ENCOUNTER — Ambulatory Visit: Payer: Self-pay | Admitting: *Deleted

## 2016-02-15 DIAGNOSIS — B029 Zoster without complications: Secondary | ICD-10-CM

## 2016-02-15 DIAGNOSIS — I10 Essential (primary) hypertension: Secondary | ICD-10-CM

## 2016-02-15 MED ORDER — VALACYCLOVIR HCL 1 G PO TABS: 1000.0000 mg | ORAL_TABLET | Freq: Two times a day (BID) | ORAL | Status: DC

## 2016-02-15 MED ORDER — METOPROLOL TARTRATE 50 MG PO TABS: 50.0000 mg | ORAL_TABLET | Freq: Two times a day (BID) | ORAL | Status: DC

## 2016-02-15 NOTE — Progress Notes (Signed)
Patient requesting nurse visit, stating he has a new rash. Patient has cluster of a few small ulcerations with irregular borders at the top of his buttocks.  He states it feels itchy, stinging.  This is surrounded by an area of induration without heat or streaking.  Patient also notes that he has an ulcer on his lip.  RN described the rash to Dr. Cliffton AstersJohn Campbell, obtained a verbal order for 1 gm valcyclovir to be taken twice daily for 5 days. Patient will call and let the clinic know if it is resolving. Patient verbalized understanding and agreement. Andree CossHowell, Mickayla Trouten M, RN

## 2016-05-30 ENCOUNTER — Encounter: Payer: Self-pay | Admitting: Internal Medicine

## 2016-06-08 ENCOUNTER — Other Ambulatory Visit: Payer: Self-pay | Admitting: Internal Medicine

## 2016-06-08 DIAGNOSIS — B20 Human immunodeficiency virus [HIV] disease: Secondary | ICD-10-CM

## 2016-07-08 ENCOUNTER — Other Ambulatory Visit: Payer: Self-pay

## 2016-07-20 ENCOUNTER — Other Ambulatory Visit: Payer: Self-pay | Admitting: Internal Medicine

## 2016-07-20 DIAGNOSIS — I10 Essential (primary) hypertension: Secondary | ICD-10-CM

## 2016-07-20 DIAGNOSIS — B20 Human immunodeficiency virus [HIV] disease: Secondary | ICD-10-CM

## 2016-07-22 ENCOUNTER — Ambulatory Visit: Payer: Self-pay | Admitting: Internal Medicine

## 2016-07-29 ENCOUNTER — Ambulatory Visit (INDEPENDENT_AMBULATORY_CARE_PROVIDER_SITE_OTHER): Payer: Self-pay | Admitting: Internal Medicine

## 2016-07-29 DIAGNOSIS — Z23 Encounter for immunization: Secondary | ICD-10-CM

## 2016-07-29 DIAGNOSIS — B20 Human immunodeficiency virus [HIV] disease: Secondary | ICD-10-CM

## 2016-07-29 DIAGNOSIS — R059 Cough, unspecified: Secondary | ICD-10-CM | POA: Insufficient documentation

## 2016-07-29 DIAGNOSIS — R05 Cough: Secondary | ICD-10-CM

## 2016-07-29 DIAGNOSIS — K0889 Other specified disorders of teeth and supporting structures: Secondary | ICD-10-CM

## 2016-07-29 NOTE — Assessment & Plan Note (Signed)
We will get him on the dental clinic waiting list. 

## 2016-07-29 NOTE — Assessment & Plan Note (Signed)
He may have pneumonia. He does not have insurance currently and would have to pay for a chest x-ray out of pocket. If he does have infection is probably viral in origin. I will not start any new antibiotic therapy at this time. I did encourage him to quit smoking cigarettes.

## 2016-07-29 NOTE — Assessment & Plan Note (Signed)
His adherence is excellent. He will continue on Odefsey and get repeat lab work today. His last CD4 count was 220 so I will have him stop trimethoprim sulfamethoxazole prophylaxis.

## 2016-07-29 NOTE — Progress Notes (Signed)
Patient Active Problem List   Diagnosis Date Noted  . HIV disease (HCC) 05/18/2015    Priority: High  . Pain, dental 07/29/2016  . Cough 07/29/2016  . Rash 06/23/2015  . Acute gouty arthritis 01/30/2015  . Oral thrush 01/30/2015  . Tobacco abuse 07/30/2012  . Hypertension 07/29/2012    Patient's Medications  New Prescriptions   No medications on file  Previous Medications   AMLODIPINE (NORVASC) 5 MG TABLET    TAKE 1 TABLET BY MOUTH DAILY   METOPROLOL (LOPRESSOR) 50 MG TABLET    Take 1 tablet (50 mg total) by mouth 2 (two) times daily.   ODEFSEY 200-25-25 MG TABS TABLET    TAKE 1 TABLET BY MOUTH DAILY   VALACYCLOVIR (VALTREX) 1000 MG TABLET    Take 1 tablet (1,000 mg total) by mouth 2 (two) times daily.  Modified Medications   No medications on file  Discontinued Medications   SULFAMETHOXAZOLE-TRIMETHOPRIM (BACTRIM DS,SEPTRA DS) 800-160 MG TABLET    TAKE 1 TABLET BY MOUTH DAILY    Subjective: Brandon Quinn Is in for his routine HIV follow-up visit. He has had no problems obtaining, tolerating or taking his Odefsey. He takes it each afternoon with a meal. He denies missing any doses. He has not had one work repeated recently. 2 weeks ago he developed a very forceful cough. Over the last week it has become productive of white sputum. He has not had any shortness of breath, fever, chills or sweats.   Review of Systems: Review of Systems  Constitutional: Negative for chills, diaphoresis, fever, malaise/fatigue and weight loss.  HENT: Negative for sore throat.   Respiratory: Positive for cough and sputum production. Negative for shortness of breath.   Cardiovascular: Negative for chest pain.  Gastrointestinal: Negative for abdominal pain, diarrhea, heartburn, nausea and vomiting.  Genitourinary: Negative for dysuria.  Musculoskeletal: Negative for joint pain and myalgias.  Skin: Negative for rash.  Neurological: Positive for headaches. Negative for dizziness.    Psychiatric/Behavioral: Negative for depression and substance abuse. The patient is not nervous/anxious.     Past Medical History:  Diagnosis Date  . Chest pain    a. 07/2012 Lexiscan Sestamibi:  no ischemia/infarct, nl EF.  Marland Kitchen. HIV infection (HCC)   . Hypertension   . Tobacco abuse     Social History  Substance Use Topics  . Smoking status: Current Every Day Smoker    Packs/day: 1.00    Years: 26.00    Types: Cigarettes  . Smokeless tobacco: Never Used     Comment: states he is trying to cut back  . Alcohol use 3.0 oz/week    5 Cans of beer per week     Comment: #2 40oz/ day    Family History  Problem Relation Age of Onset  . Other      No known heart disease    Allergies  Allergen Reactions  . Genvoya [Elviteg-Cobic-Emtricit-Tenofaf] Rash    Objective:  Vitals:   07/29/16 1549  BP: (!) 168/71  Pulse: 87  Temp: 98.3 F (36.8 C)  TempSrc: Oral  Weight: 199 lb 12 oz (90.6 kg)   Body mass index is 29.5 kg/m.  Physical Exam  Constitutional: He is oriented to person, place, and time.  HENT:  Mouth/Throat: No oropharyngeal exudate.  He has a few missing teeth and others that are significantly chipped and in poor condition. His front lower teeth are sensitive to temperature changes and hurt him when  he is chewing food.  Cardiovascular: Normal rate and regular rhythm.   No murmur heard. Pulmonary/Chest: Effort normal. He has rales.  He has a few crackles in the right lung base posteriorly.  Abdominal: Soft. There is no tenderness.  Musculoskeletal: Normal range of motion. He exhibits no edema or tenderness.  Neurological: He is alert and oriented to person, place, and time.  Skin: No rash noted.  Psychiatric: Mood and affect normal.    Lab Results Lab Results  Component Value Date   WBC 6.0 01/18/2016   HGB 14.3 01/18/2016   HCT 43.4 01/18/2016   MCV 90.6 01/18/2016   PLT 184 01/18/2016    Lab Results  Component Value Date   CREATININE 0.98  01/18/2016   BUN 8 01/18/2016   NA 136 01/18/2016   K 4.0 01/18/2016   CL 103 01/18/2016   CO2 23 01/18/2016    Lab Results  Component Value Date   ALT 17 01/18/2016   AST 32 01/18/2016   ALKPHOS 78 01/18/2016   BILITOT 0.6 01/18/2016    Lab Results  Component Value Date   CHOL 250 (H) 01/09/2016   HDL 46 01/09/2016   LDLCALC 158 (H) 01/09/2016   TRIG 232 (H) 01/09/2016   CHOLHDL 5.4 (H) 01/09/2016   HIV 1 RNA Quant (copies/mL)  Date Value  01/09/2016 <20  09/06/2015 <20  07/24/2015 1,078 (H)   CD4 T Cell Abs (/uL)  Date Value  01/09/2016 220 (L)  09/06/2015 180 (L)  07/24/2015 160 (L)     Problem List Items Addressed This Visit      High   HIV disease (HCC)    His adherence is excellent. He will continue on Odefsey and get repeat lab work today. His last CD4 count was 220 so I will have him stop trimethoprim sulfamethoxazole prophylaxis.      Relevant Orders   T-helper cell (CD4)- ([facility] clinic only)   HIV 1 RNA quant-no reflex-bld     Unprioritized   Cough    He may have pneumonia. He does not have insurance currently and would have to pay for a chest x-ray out of pocket. If he does have infection is probably viral in origin. I will not start any new antibiotic therapy at this time. I did encourage him to quit smoking cigarettes.      Relevant Orders   DG Chest 2 View   Pain, dental    We will get him on the dental clinic waiting list.       Other Visit Diagnoses   None.       Cliffton Asters, MD [facility] [phone] pager   [phone] cell 07/29/2016, 4:11 PM

## 2016-07-30 LAB — T-HELPER CELL (CD4) - (RCID CLINIC ONLY)
CD4 T CELL ABS: 230 /uL — AB (ref 400–2700)
CD4 T CELL HELPER: 5 % — AB (ref 33–55)

## 2016-07-31 LAB — HIV-1 RNA QUANT-NO REFLEX-BLD: HIV 1 RNA Quant: <20 copies/mL (ref <20)

## 2016-09-27 ENCOUNTER — Other Ambulatory Visit: Payer: Self-pay | Admitting: Internal Medicine

## 2016-09-27 DIAGNOSIS — I10 Essential (primary) hypertension: Secondary | ICD-10-CM

## 2016-10-31 ENCOUNTER — Encounter: Payer: Self-pay | Admitting: Internal Medicine

## 2016-11-10 ENCOUNTER — Other Ambulatory Visit: Payer: Self-pay | Admitting: Internal Medicine

## 2016-11-10 DIAGNOSIS — B20 Human immunodeficiency virus [HIV] disease: Secondary | ICD-10-CM

## 2017-02-28 ENCOUNTER — Other Ambulatory Visit: Payer: Self-pay | Admitting: Internal Medicine

## 2017-02-28 DIAGNOSIS — B20 Human immunodeficiency virus [HIV] disease: Secondary | ICD-10-CM

## 2017-02-28 DIAGNOSIS — I10 Essential (primary) hypertension: Secondary | ICD-10-CM

## 2017-04-05 ENCOUNTER — Other Ambulatory Visit: Payer: Self-pay | Admitting: Internal Medicine

## 2017-04-05 DIAGNOSIS — B20 Human immunodeficiency virus [HIV] disease: Secondary | ICD-10-CM

## 2017-04-08 ENCOUNTER — Other Ambulatory Visit: Payer: Self-pay

## 2017-04-08 DIAGNOSIS — B20 Human immunodeficiency virus [HIV] disease: Secondary | ICD-10-CM

## 2017-04-08 DIAGNOSIS — Z113 Encounter for screening for infections with a predominantly sexual mode of transmission: Secondary | ICD-10-CM

## 2017-04-08 DIAGNOSIS — Z79899 Other long term (current) drug therapy: Secondary | ICD-10-CM

## 2017-04-08 LAB — COMPREHENSIVE METABOLIC PANEL
ALT: 11 U/L (ref 9–46)
AST: 22 U/L (ref 10–35)
Albumin: 3.8 g/dL (ref 3.6–5.1)
Alkaline Phosphatase: 89 U/L (ref 40–115)
BILIRUBIN TOTAL: 0.4 mg/dL (ref 0.2–1.2)
BUN: 9 mg/dL (ref 7–25)
CHLORIDE: 108 mmol/L (ref 98–110)
CO2: 26 mmol/L (ref 20–31)
CREATININE: 1.06 mg/dL (ref 0.70–1.33)
Calcium: 9 mg/dL (ref 8.6–10.3)
Glucose, Bld: 108 mg/dL — ABNORMAL HIGH (ref 65–99)
Potassium: 4.1 mmol/L (ref 3.5–5.3)
SODIUM: 141 mmol/L (ref 135–146)
TOTAL PROTEIN: 7.6 g/dL (ref 6.1–8.1)

## 2017-04-08 LAB — CBC WITH DIFFERENTIAL/PLATELET
Basophils Absolute: 58 cells/uL (ref 0–200)
Basophils Relative: 1 %
Eosinophils Absolute: 232 cells/uL (ref 15–500)
Eosinophils Relative: 4 %
HEMATOCRIT: 41.1 % (ref 38.5–50.0)
Hemoglobin: 13.8 g/dL (ref 13.2–17.1)
LYMPHS PCT: 56 %
Lymphs Abs: 3248 cells/uL (ref 850–3900)
MCH: 30.4 pg (ref 27.0–33.0)
MCHC: 33.6 g/dL (ref 32.0–36.0)
MCV: 90.5 fL (ref 80.0–100.0)
MONO ABS: 522 {cells}/uL (ref 200–950)
MONOS PCT: 9 %
MPV: 9.7 fL (ref 7.5–12.5)
NEUTROS PCT: 30 %
Neutro Abs: 1740 cells/uL (ref 1500–7800)
Platelets: 208 10*3/uL (ref 140–400)
RBC: 4.54 MIL/uL (ref 4.20–5.80)
RDW: 15 % (ref 11.0–15.0)
WBC: 5.8 10*3/uL (ref 3.8–10.8)

## 2017-04-08 LAB — LIPID PANEL
CHOL/HDL RATIO: 5 ratio — AB (ref <5.0)
Cholesterol: 245 mg/dL — ABNORMAL HIGH (ref <200)
HDL: 49 mg/dL (ref >40)
LDL Cholesterol: 168 mg/dL — ABNORMAL HIGH (ref <100)
Triglycerides: 138 mg/dL (ref <150)
VLDL: 28 mg/dL (ref <30)

## 2017-04-10 LAB — T-HELPER CELL (CD4) - (RCID CLINIC ONLY)
CD4 T CELL ABS: 280 /uL — AB (ref 400–2700)
CD4 T CELL HELPER: 8 % — AB (ref 33–55)

## 2017-04-11 LAB — RPR

## 2017-04-11 LAB — HIV-1 RNA QUANT-NO REFLEX-BLD
HIV 1 RNA Quant: <20 copies/mL
HIV-1 RNA QUANT, LOG: NOT DETECTED {Log_copies}/mL

## 2017-04-23 ENCOUNTER — Ambulatory Visit: Payer: Self-pay | Admitting: Internal Medicine

## 2017-04-23 ENCOUNTER — Ambulatory Visit (INDEPENDENT_AMBULATORY_CARE_PROVIDER_SITE_OTHER): Payer: Self-pay | Admitting: Pharmacist Clinician (PhC)/ Clinical Pharmacy Specialist

## 2017-04-23 DIAGNOSIS — B20 Human immunodeficiency virus [HIV] disease: Secondary | ICD-10-CM

## 2017-04-23 MED ORDER — EMTRICITAB-RILPIVIR-TENOFOV AF 200-25-25 MG PO TABS: 1.0000 | ORAL_TABLET | Freq: Every day | ORAL | 3 refills | Status: DC

## 2017-04-23 MED ORDER — EMTRICITAB-RILPIVIR-TENOFOV AF 200-25-25 MG PO TABS: 1.0000 | ORAL_TABLET | Freq: Every day | ORAL | 1 refills | Status: DC

## 2017-04-23 NOTE — Progress Notes (Signed)
HPI: Lora Glomski is a 51 y.o. male who was supposed to see Dr. Orvan Falconer today but is here to see pharmacy instead due to his job.  Allergies: Allergies  Allergen Reactions  . Genvoya [Elviteg-Cobic-Emtricit-Tenofaf] Rash    Vitals:    Past Medical History: Past Medical History:  Diagnosis Date  . Chest pain    a. 07/2012 Lexiscan Sestamibi:  no ischemia/infarct, nl EF.  Marland Kitchen HIV infection (HCC)   . Hypertension   . Tobacco abuse     Social History: Social History   Social History  . Marital status: Single    Spouse name: N/A  . Number of children: N/A  . Years of education: N/A   Social History Main Topics  . Smoking status: Current Every Day Smoker    Packs/day: 1.00    Years: 26.00    Types: Cigarettes  . Smokeless tobacco: Never Used     Comment: states he is trying to cut back  . Alcohol use 3.0 oz/week    5 Cans of beer per week     Comment: #2 40oz/ day  . Drug use: No  . Sexual activity: Not Currently    Partners: Female    Birth control/ protection: Condom   Other Topics Concern  . Not on file   Social History Narrative  . No narrative on file    Previous Regimen: Genvoya  Current Regimen: Odefsey  Labs: HIV 1 RNA Quant (copies/mL)  Date Value  04/08/2017 <20 NOT DETECTED  07/29/2016 <20  01/09/2016 <20   CD4 T Cell Abs (/uL)  Date Value  04/08/2017 280 (L)  07/29/2016 230 (L)  01/09/2016 220 (L)   Hep B S Ab (no units)  Date Value  01/30/2015 POSITIVE (A)   Hepatitis B Surface Ag (no units)  Date Value  01/30/2015 NEGATIVE   HCV Ab (no units)  Date Value  01/30/2015 NEGATIVE    CrCl: CrCl cannot be calculated (Unknown ideal weight.).  Lipids:    Component Value Date/Time   CHOL 245 (H) 04/08/2017 1751   TRIG 138 04/08/2017 1751   HDL 49 04/08/2017 1751   CHOLHDL 5.0 (H) 04/08/2017 1751   VLDL 28 04/08/2017 1751   LDLCALC 168 (H) 04/08/2017 1751    Assessment: Benzion recently change his job to be on the  [employer] at [employer]. His schedule is mostly second shift now so he couldn't come to the visit with Dr. Orvan Falconer today in the afternoon. He was added to the pharmacy schedule instead. He was previously on ADAP with his previous job. He will not get insurance with the new job until about Sept. Therefore, I got him to meet with Olegario Messier today to renew his ADAP just in case. Advised him to call us if he ever have any issue with coverage. He complained of some soreness in his hand and knees. Asked him if this a chronic issue, he said yes. He didn't think the over the counter medication is working for him and wanted oxycodone instead. Told him that he don't do that in our clinic. He needs to get a primary care physician once his insurance kicks in. He'll go with that plan. Told him to take 2 advils and 2 tylenol for pain instead.   Otherwise, he has been doing great on Odefsey without missing any doses. His VL and CD4 were 2 wks ago. We will continue him on the same regimen. He'll come back to see Dr. Orvan Falconer in Sept. We  can start his Menveo series then.   Recommendations:  Cont Odefsey 1 PO qday with a meal F/u with Dr. Orvan Falconerampbell in Sept and start Menveo series Try taking 2 Advils + APAP 500mg  q8h as needed for pain  Ulyses SouthwardMinh Monterius Rolf, PharmD, BCPS, AAHIVP, CPP Clinical Infectious Disease Pharmacist [facility] 04/23/2017, 1:49 PM

## 2017-04-23 NOTE — Patient Instructions (Signed)
Continue your Odefsey daily Follow up with Dr. Orvan Falconerampbell in Sept.  Please renew your ADAP until the insurance kicks in

## 2017-04-25 ENCOUNTER — Encounter: Payer: Self-pay | Admitting: Internal Medicine

## 2017-05-09 ENCOUNTER — Other Ambulatory Visit: Payer: Self-pay | Admitting: Internal Medicine

## 2017-05-09 DIAGNOSIS — I10 Essential (primary) hypertension: Secondary | ICD-10-CM

## 2017-05-20 ENCOUNTER — Other Ambulatory Visit: Payer: Self-pay | Admitting: *Deleted

## 2017-05-20 ENCOUNTER — Telehealth: Payer: Self-pay | Admitting: *Deleted

## 2017-05-20 DIAGNOSIS — B20 Human immunodeficiency virus [HIV] disease: Secondary | ICD-10-CM

## 2017-05-20 MED ORDER — EMTRICITAB-RILPIVIR-TENOFOV AF 200-25-25 MG PO TABS: 1.0000 | ORAL_TABLET | Freq: Every day | ORAL | 5 refills | Status: DC

## 2017-05-20 NOTE — Telephone Encounter (Signed)
Patient's ADAP was cancelled on 8/3 without notice to him. He has missed 4 days of medication. Con MemosMichelle Evans is working patient assistance, patient was given 30 day supply procured from pharmacy. Andree CossHowell, [place] Every M, RN

## 2017-06-01 ENCOUNTER — Observation Stay (HOSPITAL_COMMUNITY)
Admission: EM | Admit: 2017-06-01 | Discharge: 2017-06-02 | Disposition: A | Discharge status: Home / Self Care | Payer: Self-pay | Attending: Internal Medicine | Admitting: Internal Medicine

## 2017-06-01 ENCOUNTER — Emergency Department (HOSPITAL_COMMUNITY): Payer: Self-pay

## 2017-06-01 ENCOUNTER — Encounter (HOSPITAL_COMMUNITY): Payer: Self-pay | Admitting: Emergency Medicine

## 2017-06-01 DIAGNOSIS — Z720 Tobacco use: Secondary | ICD-10-CM | POA: Diagnosis present

## 2017-06-01 DIAGNOSIS — F17210 Nicotine dependence, cigarettes, uncomplicated: Secondary | ICD-10-CM | POA: Insufficient documentation

## 2017-06-01 DIAGNOSIS — Z79899 Other long term (current) drug therapy: Secondary | ICD-10-CM | POA: Insufficient documentation

## 2017-06-01 DIAGNOSIS — B20 Human immunodeficiency virus [HIV] disease: Secondary | ICD-10-CM | POA: Diagnosis present

## 2017-06-01 DIAGNOSIS — I161 Hypertensive emergency: Principal | ICD-10-CM

## 2017-06-01 DIAGNOSIS — I10 Essential (primary) hypertension: Secondary | ICD-10-CM | POA: Diagnosis present

## 2017-06-01 DIAGNOSIS — I160 Hypertensive urgency: Secondary | ICD-10-CM

## 2017-06-01 DIAGNOSIS — F1010 Alcohol abuse, uncomplicated: Secondary | ICD-10-CM

## 2017-06-01 LAB — RAPID URINE DRUG SCREEN, HOSP PERFORMED
Amphetamines: NOT DETECTED
BARBITURATES: NOT DETECTED
Benzodiazepines: NOT DETECTED
Cocaine: NOT DETECTED
OPIATES: NOT DETECTED
TETRAHYDROCANNABINOL: NOT DETECTED

## 2017-06-01 LAB — URINALYSIS, ROUTINE W REFLEX MICROSCOPIC
BILIRUBIN URINE: NEGATIVE
Glucose, UA: NEGATIVE mg/dL
HGB URINE DIPSTICK: NEGATIVE
Ketones, ur: NEGATIVE mg/dL
Leukocytes, UA: NEGATIVE
NITRITE: NEGATIVE
PROTEIN: NEGATIVE mg/dL
SPECIFIC GRAVITY, URINE: 1.01 (ref 1.005–1.030)
pH: 8 (ref 5.0–8.0)

## 2017-06-01 LAB — BASIC METABOLIC PANEL
ANION GAP: 7 (ref 5–15)
BUN: 5 mg/dL — ABNORMAL LOW (ref 6–20)
CALCIUM: 8.6 mg/dL — AB (ref 8.9–10.3)
CO2: 24 mmol/L (ref 22–32)
CREATININE: 0.88 mg/dL (ref 0.61–1.24)
Chloride: 107 mmol/L (ref 101–111)
Glucose, Bld: 82 mg/dL (ref 65–99)
Potassium: 3.7 mmol/L (ref 3.5–5.1)
SODIUM: 138 mmol/L (ref 135–145)

## 2017-06-01 LAB — CBC
HCT: 44.5 % (ref 39.0–52.0)
Hemoglobin: 14.9 g/dL (ref 13.0–17.0)
MCH: 30.2 pg (ref 26.0–34.0)
MCHC: 33.5 g/dL (ref 30.0–36.0)
MCV: 90.1 fL (ref 78.0–100.0)
PLATELETS: 207 10*3/uL (ref 150–400)
RBC: 4.94 MIL/uL (ref 4.22–5.81)
RDW: 14.1 % (ref 11.5–15.5)
WBC: 7.8 10*3/uL (ref 4.0–10.5)

## 2017-06-01 LAB — LIPASE, BLOOD: LIPASE: 40 U/L (ref 11–51)

## 2017-06-01 LAB — TROPONIN I
TROPONIN I: 0.06 ng/mL — AB (ref <0.03)
TROPONIN I: 0.06 ng/mL — AB (ref <0.03)
Troponin I: 0.06 ng/mL (ref <0.03)

## 2017-06-01 LAB — ETHANOL: Alcohol, Ethyl (B): <5 mg/dL (ref <5)

## 2017-06-01 LAB — HEPATIC FUNCTION PANEL
ALBUMIN: 3.4 g/dL — AB (ref 3.5–5.0)
ALT: 13 U/L — AB (ref 17–63)
AST: 27 U/L (ref 15–41)
Alkaline Phosphatase: 76 U/L (ref 38–126)
Bilirubin, Direct: <0.1 mg/dL — ABNORMAL LOW (ref 0.1–0.5)
TOTAL PROTEIN: 7.8 g/dL (ref 6.5–8.1)
Total Bilirubin: 0.5 mg/dL (ref 0.3–1.2)

## 2017-06-01 LAB — I-STAT TROPONIN, ED: Troponin i, poc: 0.06 ng/mL (ref 0.00–0.08)

## 2017-06-01 MED ORDER — GI COCKTAIL ~~LOC~~
30.0000 mL | Freq: Once | ORAL | Status: AC
Start: 2017-06-01 — End: 2017-06-01
  Administered 2017-06-01: 30 mL via ORAL
  Filled 2017-06-01: qty 30

## 2017-06-01 MED ORDER — ONDANSETRON HCL 4 MG PO TABS: 4.0000 mg | ORAL_TABLET | Freq: Four times a day (QID) | ORAL | Status: DC | PRN

## 2017-06-01 MED ORDER — EMTRICITAB-RILPIVIR-TENOFOV AF 200-25-25 MG PO TABS
1.0000 | ORAL_TABLET | Freq: Every day | ORAL | Status: DC
  Administered 2017-06-02: 1 via ORAL
  Filled 2017-06-01: qty 1

## 2017-06-01 MED ORDER — AMLODIPINE BESYLATE 5 MG PO TABS
5.0000 mg | ORAL_TABLET | Freq: Every day | ORAL | Status: DC
  Administered 2017-06-02: 5 mg via ORAL
  Filled 2017-06-01: qty 1

## 2017-06-01 MED ORDER — VITAMIN B-1 100 MG PO TABS
100.0000 mg | ORAL_TABLET | Freq: Every day | ORAL | Status: DC
  Administered 2017-06-01 – 2017-06-02 (×2): 100 mg via ORAL
  Filled 2017-06-01 (×2): qty 1

## 2017-06-01 MED ORDER — ENOXAPARIN SODIUM 40 MG/0.4ML ~~LOC~~ SOLN
40.0000 mg | SUBCUTANEOUS | Status: DC
Start: 2017-06-01 — End: 2017-06-02
  Administered 2017-06-01: 40 mg via SUBCUTANEOUS

## 2017-06-01 MED ORDER — SODIUM CHLORIDE 0.9 % IV SOLN
INTRAVENOUS | Status: DC
  Administered 2017-06-01: 17:00:00 via INTRAVENOUS

## 2017-06-01 MED ORDER — NITROGLYCERIN 0.4 MG SL SUBL: 0.4000 mg | SUBLINGUAL_TABLET | SUBLINGUAL | Status: DC | PRN

## 2017-06-01 MED ORDER — THIAMINE HCL 100 MG/ML IJ SOLN: 100.0000 mg | Freq: Every day | INTRAMUSCULAR | Status: DC

## 2017-06-01 MED ORDER — LORAZEPAM 2 MG/ML IJ SOLN: 1.0000 mg | Freq: Four times a day (QID) | INTRAMUSCULAR | Status: DC | PRN

## 2017-06-01 MED ORDER — HYDRALAZINE HCL 20 MG/ML IJ SOLN
5.0000 mg | Freq: Four times a day (QID) | INTRAMUSCULAR | Status: DC | PRN
  Administered 2017-06-01: 10 mg via INTRAVENOUS
  Administered 2017-06-02: 5 mg via INTRAVENOUS
  Filled 2017-06-01 (×2): qty 1

## 2017-06-01 MED ORDER — ONDANSETRON HCL 4 MG/2ML IJ SOLN: 4.0000 mg | Freq: Four times a day (QID) | INTRAMUSCULAR | Status: DC | PRN

## 2017-06-01 MED ORDER — BISACODYL 10 MG RE SUPP: 10.0000 mg | Freq: Every day | RECTAL | Status: DC | PRN

## 2017-06-01 MED ORDER — GI COCKTAIL ~~LOC~~: 30.0000 mL | Freq: Three times a day (TID) | ORAL | Status: DC | PRN

## 2017-06-01 MED ORDER — FOLIC ACID 1 MG PO TABS
1.0000 mg | ORAL_TABLET | Freq: Every day | ORAL | Status: DC
  Administered 2017-06-01 – 2017-06-02 (×2): 1 mg via ORAL
  Filled 2017-06-01 (×2): qty 1

## 2017-06-01 MED ORDER — ADULT MULTIVITAMIN W/MINERALS CH
1.0000 | ORAL_TABLET | Freq: Every day | ORAL | Status: DC
  Administered 2017-06-01 – 2017-06-02 (×2): 1 via ORAL
  Filled 2017-06-01: qty 1

## 2017-06-01 MED ORDER — ASPIRIN 81 MG PO CHEW
324.0000 mg | CHEWABLE_TABLET | Freq: Once | ORAL | Status: AC
  Administered 2017-06-01: 324 mg via ORAL
  Filled 2017-06-01: qty 4

## 2017-06-01 MED ORDER — SENNOSIDES-DOCUSATE SODIUM 8.6-50 MG PO TABS: 1.0000 | ORAL_TABLET | Freq: Every evening | ORAL | Status: DC | PRN

## 2017-06-01 MED ORDER — MORPHINE SULFATE (PF) 4 MG/ML IV SOLN
4.0000 mg | Freq: Once | INTRAVENOUS | Status: DC
  Filled 2017-06-01: qty 1

## 2017-06-01 MED ORDER — ACETAMINOPHEN 650 MG RE SUPP
650.0000 mg | Freq: Four times a day (QID) | RECTAL | Status: DC | PRN
Start: 2017-06-01 — End: 2017-06-02

## 2017-06-01 MED ORDER — ONDANSETRON HCL 4 MG/2ML IJ SOLN
4.0000 mg | Freq: Once | INTRAMUSCULAR | Status: AC
  Administered 2017-06-01: 4 mg via INTRAVENOUS
  Filled 2017-06-01: qty 2

## 2017-06-01 MED ORDER — HYDROCODONE-ACETAMINOPHEN 5-325 MG PO TABS: 1.0000 | ORAL_TABLET | ORAL | Status: DC | PRN

## 2017-06-01 MED ORDER — ACETAMINOPHEN 325 MG PO TABS: 650.0000 mg | ORAL_TABLET | Freq: Four times a day (QID) | ORAL | Status: DC | PRN

## 2017-06-01 MED ORDER — LORAZEPAM 1 MG PO TABS: 1.0000 mg | ORAL_TABLET | Freq: Four times a day (QID) | ORAL | Status: DC | PRN

## 2017-06-01 NOTE — ED Provider Notes (Signed)
[facility]-EMERGENCY DEPT Provider Note   CSN: [id number] Arrival date & time: 06/01/17  1009     History   Chief Complaint Chief Complaint  Patient presents with  . Hypertension    HPI Brandon Quinn is a 51 y.o. male.  HPI   52 year old male with past medical history of HIV, chronic tobacco use, chronic alcohol use, who presents with left-sided arm tingling and nausea/vomiting. The patient states he is interstitial state of health last night. He normally drinks several cans of beer per day but drank liquor throughout the night last night. He slept in a different kind of position with his left arm folded under his head. On awakening this morning, he had tingling along the medial aspect of his left forearm. He states he has had this similarly in the past when he has been intoxicated. He also endorses an aching, gnawing, epigastric pain. He has had nausea and vomiting throughout the morning today. This also occurs when he drinks heavily. Denies known history of pancreatitis. No fevers or chills. No shortness of breath, nausea, or diaphoresis. He states he has been otherwise taking his medications as prescribed. Pain is worse with eating. Denies any alleviating factors.  Past Medical History:  Diagnosis Date  . Alcohol abuse   . Chest pain    a. 07/2012 Lexiscan Sestamibi:  no ischemia/infarct, nl EF.  Marland Kitchen HIV infection (HCC)   . Hypertension   . Tobacco abuse     Patient Active Problem List   Diagnosis Date Noted  . Hypertensive urgency 06/01/2017  . Alcohol abuse 06/01/2017  . Pain, dental 07/29/2016  . Cough 07/29/2016  . Rash 06/23/2015  . HIV disease (HCC) 05/18/2015  . Acute gouty arthritis 01/30/2015  . Oral thrush 01/30/2015  . Tobacco abuse 07/30/2012  . Hypertension 07/29/2012    Past Surgical History:  Procedure Laterality Date  . WRIST FRACTURE SURGERY  ~ 2009   right; "put a plate in it" (40/98/1191)       Home Medications    Prior to Admission medications     Medication Sig Start Date End Date Taking? Authorizing Provider  amLODipine (NORVASC) 5 MG tablet TAKE 1 TABLET BY MOUTH DAILY 05/09/17  Yes Cliffton Asters, MD  emtricitabine-rilpivir-tenofovir AF (ODEFSEY) 200-25-25 MG TABS tablet Take 1 tablet by mouth daily. 05/20/17  Yes Comer, Belia Heman, MD  naproxen sodium (ANAPROX) 220 MG tablet Take 220 mg by mouth daily as needed (general pain).   Yes [provider]  metoprolol tartrate (LOPRESSOR) 50 MG tablet TAKE 1 TABLET(50 MG) BY MOUTH TWICE DAILY Patient not taking: Reported on 06/01/2017 05/09/17   Cliffton Asters, MD    Family History Family History  Problem Relation Age of Onset  . Other Unknown        No known heart disease    Social History Social History  Substance Use Topics  . Smoking status: Current Every Day Smoker    Packs/day: 1.00    Years: 26.00    Types: Cigarettes  . Smokeless tobacco: Never Used     Comment: states he is trying to cut back  . Alcohol use 4.2 oz/week    5 Cans of beer, 2 Shots of liquor per week     Comment: #2 40oz/ day     Allergies   Genvoya [elviteg-cobic-emtricit-tenofaf]   Review of Systems Review of Systems  Constitutional: Positive for fatigue. Negative for chills and fever.  HENT: Negative for congestion and rhinorrhea.   Eyes: Negative for visual  disturbance.  Respiratory: Negative for cough, shortness of breath and wheezing.   Cardiovascular: Negative for chest pain and leg swelling.  Gastrointestinal: Positive for abdominal pain, nausea and vomiting. Negative for diarrhea.  Genitourinary: Negative for dysuria and flank pain.  Musculoskeletal: Negative for neck pain and neck stiffness.  Skin: Negative for rash and wound.  Allergic/Immunologic: Negative for immunocompromised state.  Neurological: Positive for numbness. Negative for syncope, weakness and headaches.  All other systems reviewed and are negative.    Physical Exam Updated Vital Signs BP (!) 156/62   Pulse  64   Temp 98.3 F (36.8 C) (Oral)   Resp 18   Ht 6' (1.829 m)   Wt 89.2 kg (196 lb 11.2 oz)   SpO2 99%   BMI 26.68 kg/m   Physical Exam  Constitutional: He is oriented to person, place, and time. He appears well-developed and well-nourished. No distress.  HENT:  Head: Normocephalic and atraumatic.  Eyes: Conjunctivae are normal.  Neck: Neck supple.  Cardiovascular: Normal rate, regular rhythm and normal heart sounds.  Exam reveals no friction rub.   No murmur heard. Pulmonary/Chest: Effort normal and breath sounds normal. No respiratory distress. He has no wheezes. He has no rales.  Abdominal: Soft. Bowel sounds are normal. He exhibits no distension. There is tenderness (Mild, epigastric).  Musculoskeletal: He exhibits no edema.  Neurological: He is alert and oriented to person, place, and time. He has normal strength. No cranial nerve deficit or sensory deficit. He exhibits normal muscle tone. Coordination and gait normal.  Subjective decreased sensation along distal median nerve distribution of left arm. No weakness or numbness throughout the remainder of the extremity.  Skin: Skin is warm. Capillary refill takes less than 2 seconds.  Psychiatric: He has a normal mood and affect.  Nursing note and vitals reviewed.    ED Treatments / Results  Labs (all labs ordered are listed, but only abnormal results are displayed) Labs Reviewed  BASIC METABOLIC PANEL - Abnormal; Notable for the following:       Result Value   BUN 5 (*)    Calcium 8.6 (*)    All other components within normal limits  URINALYSIS, ROUTINE W REFLEX MICROSCOPIC - Abnormal; Notable for the following:    Color, Urine STRAW (*)    All other components within normal limits  HEPATIC FUNCTION PANEL - Abnormal; Notable for the following:    Albumin 3.4 (*)    ALT 13 (*)    Bilirubin, Direct <0.1 (*)    All other components within normal limits  TROPONIN I - Abnormal; Notable for the following:    Troponin I  0.06 (*)    All other components within normal limits  TROPONIN I - Abnormal; Notable for the following:    Troponin I 0.06 (*)    All other components within normal limits  TROPONIN I - Abnormal; Notable for the following:    Troponin I 0.06 (*)    All other components within normal limits  COMPREHENSIVE METABOLIC PANEL - Abnormal; Notable for the following:    Glucose, Bld 105 (*)    Calcium 8.5 (*)    Albumin 2.9 (*)    ALT 11 (*)    All other components within normal limits  CBC  LIPASE, BLOOD  ETHANOL  RAPID URINE DRUG SCREEN, HOSP PERFORMED  CBC  I-STAT TROPONIN, ED    EKG  EKG Interpretation  Date/Time:  Sunday June 01 2017 10:15:43 EDT Ventricular Rate:  74 PR  Interval:    QRS Duration: 90 QT Interval:  399 QTC Calculation: 443 R Axis:   0 Text Interpretation:  Sinus rhythm Probable left atrial enlargement LVH with secondary repolarization abnormality Baseline wander in lead(s) V4 No significant change since last tracing Confirmed by Shaune Pollack [id number]) on 06/01/2017 10:34:14 AM       Radiology Dg Chest 2 View  Result Date: 06/01/2017 CLINICAL DATA:  Hypertension, dizziness EXAM: CHEST  2 VIEW COMPARISON:  01/18/2016 FINDINGS: Mild bilateral lower lobe opacities, chronic, likely atelectasis/scarring. No pleural effusion or pneumothorax. The heart is top normal in size. Mild degenerative changes of the visualized thoracolumbar spine. IMPRESSION: No evidence of acute cardiopulmonary disease. Electronically Signed   By: Charline Bills M.D.   On: 06/01/2017 11:23   Ct Head Wo Contrast  Result Date: 06/01/2017 CLINICAL DATA:  Hypertension EXAM: CT HEAD WITHOUT CONTRAST TECHNIQUE: Contiguous axial images were obtained from the base of the skull through the vertex without intravenous contrast. COMPARISON:  06/01/2013 FINDINGS: Brain: No evidence of acute infarction, hemorrhage, extra-axial collection or mass lesion/mass effect. Stable choroid or ependymal cyst in  the left lateral ventricle (series 3/image 17). No hydrocephalus. Mild degenerative changes of the visualized thoracolumbar spine. Vascular: No hyperdense vessel or unexpected calcification. Skull: Normal. Negative for fracture or focal lesion. 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 Visualized paranasal sinuses and mastoid air cells are otherwise clear. Other: None. IMPRESSION: No evidence of acute intracranial abnormality. Electronically Signed   By: Charline Bills M.D.   On: 06/01/2017 11:31    Procedures Procedures (including critical care time)  Medications Ordered in ED Medications  morphine 4 MG/ML injection 4 mg (4 mg Intravenous Refused 06/01/17 1215)  nitroGLYCERIN (NITROSTAT) SL tablet 0.4 mg (not administered)  gi cocktail (Maalox,Lidocaine,Donnatal) (not administered)  emtricitabine-rilpivir-tenofovir AF (ODEFSEY) 200-25-25 MG per tablet 1 tablet (not administered)  amLODipine (NORVASC) tablet 5 mg (not administered)  0.9 %  sodium chloride infusion ( Intravenous New Bag/Given 06/01/17 1725)  acetaminophen (TYLENOL) tablet 650 mg (not administered)    Or  acetaminophen (TYLENOL) suppository 650 mg (not administered)  senna-docusate (Senokot-S) tablet 1 tablet (not administered)  bisacodyl (DULCOLAX) suppository 10 mg (not administered)  ondansetron (ZOFRAN) tablet 4 mg (not administered)    Or  ondansetron (ZOFRAN) injection 4 mg (not administered)  enoxaparin (LOVENOX) injection 40 mg (40 mg Subcutaneous Given 06/01/17 1726)  HYDROcodone-acetaminophen (NORCO/VICODIN) 5-325 MG per tablet 1-2 tablet (not administered)  LORazepam (ATIVAN) tablet 1 mg (not administered)    Or  LORazepam (ATIVAN) injection 1 mg (not administered)  thiamine (VITAMIN B-1) tablet 100 mg (100 mg Oral Given 06/01/17 1726)    Or  thiamine (B-1) injection 100 mg ( Intravenous See Alternative 06/01/17 1726)  folic acid (FOLVITE) tablet 1 mg (1 mg  Oral Given 06/01/17 1726)  multivitamin with minerals tablet 1 tablet (1 tablet Oral Given 06/01/17 1726)  hydrALAZINE (APRESOLINE) injection 5-10 mg (5 mg Intravenous Given 06/02/17 0351)  metoprolol tartrate (LOPRESSOR) tablet 50 mg (not administered)  ondansetron (ZOFRAN) injection 4 mg (4 mg Intravenous Given 06/01/17 1216)  gi cocktail (Maalox,Lidocaine,Donnatal) (30 mLs Oral Given 06/01/17 1208)  aspirin chewable tablet 324 mg (324 mg Oral Given 06/01/17 1402)     Initial Impression / Assessment and Plan / ED Course  I have reviewed the triage vital signs and the nursing notes.  Pertinent labs & imaging results that were available during my care of the patient were reviewed  by me and considered in my medical decision making (see chart for details).    51 year old male with past medical history as above. With mild nausea, transient chest pressure, and left arm tightness after drinking last night. He has a history of HIV and medical nonadherence. Denies any fevers, however. EKG on arrival shows diffuse changes of LVH, with possible strain pattern. His initial i-STAT troponin is negative but formal troponin is positive. Blood pressure is improving in the ED, but remains elevated. He has been given aspirin. He has no ongoing chest pain. However, I'm concerned that he likely has hypertensive urgency with possible subsequent demand ischemia in the setting of medication nonadherence and alcohol use. He has no evidence of pancreatitis. No signs of ST elevation or depression. Will admit for blood pressure control and trending of troponins.   This note was prepared with assistance of Conservation officer, historic buildings. Occasional wrong-word or sound-a-like substitutions may have occurred due to the inherent limitations of voice recognition software.  Final Clinical Impressions(s) / ED Diagnoses   Final diagnoses:  Hypertensive urgency    New Prescriptions Current Discharge Medication List        Shaune Pollack, MD 06/02/17 [phone]

## 2017-06-01 NOTE — ED Triage Notes (Signed)
Arrived via EMS patient called EMS for high blood pressure and left arm tingling. Denies chest pain or shortness of breath. Patient has a history of HTN and yesterday drank vodka 1 cup and states after drinking he always feels this way the next morning. Last known well 2300 woke up today at 0730 left arm tingling and took BP medication however vomited x1 after. Alert answering and following commands appropriate. States tingling left arm 2/10.

## 2017-06-01 NOTE — ED Notes (Signed)
Patient refused morphine denies pain or left arm tingling.

## 2017-06-01 NOTE — ED Notes (Signed)
Admit provider at bedside 

## 2017-06-01 NOTE — ED Notes (Addendum)
Pt now reports he does not want to be admitted. Explained to pt reasoning behind admission but pt still refuses. When asked why it was not mentioned earlier, pt states "I don't know. I just didn't say it but I don't want to be admitted." Admitting aware. Dr. Erma Heritage aware.

## 2017-06-01 NOTE — ED Notes (Signed)
Doctor at bedside.

## 2017-06-01 NOTE — ED Notes (Signed)
Entered in EPIC to add troponin lab to available blood. Blood still pending and spoke with lab who stated will add to available blood.

## 2017-06-01 NOTE — ED Notes (Signed)
Lab called critical lab troponin 0.06 reported to EDP.

## 2017-06-01 NOTE — H&P (Signed)
History and Physical    Brandon Quinn MBW:[id number] DOB: [birth year] DOA: 06/01/2017   PCP: Patient, No Pcp Per   Patient coming from:  Home    Chief Complaint:  Nausea and vomiting after heavy drinking   HPI: Brandon Quinn is a 51 y.o. male with medical history significant for HTN, HIV, chronic tobacco and alcohol abuse, presenting with L sided tingling, nausea and vomiting after a night of heavy drinking. Symptoms reported to be similar to prior intoxicated events and are now result. He also reported epigastric pain after eating but this resolved. Denies h/o hepatitis. Denies fevers, chills, night sweats, vision changes, or mucositis. Denies any respiratory complaints. Denies any chest pain or palpitations. Denies lower extremity swelling.  Appetite is normal. Denies any dysuria. Denies abnormal skin rashes, or neuropathy. Denies any bleeding issues such as epistaxis, hematemesis, hematuria or hematochezia. Ambulating without difficulty but he is fatigued. Denies long distance trips. He is compliant with his own medications. He never had a cardiology evaluation or had a cardiac catheterization.   ED Course:  BP (!) 166/79   Pulse 66   Temp 98.8 F (37.1 C) (Oral)   Resp 18   Ht 5' 10"  (1.778 m)   Wt 90.7 kg (200 lb)   SpO2 98%   BMI 28.70 kg/m   BP (!) 166/79   Pulse 66   Temp 98.8 F (37.1 C) (Oral)   Resp 18   Ht 5' 10"  (1.778 m)   Wt 90.7 kg (200 lb)   SpO2 98%   BMI 28.70 kg/m   troponin 0.06  Sinus rhythm Probable left atrial enlargement LVH with secondary repolarization  resume morphine 4 mg times 1 receives Zofran 4 mg IVx1  he received G.I. Cocktail be met is normal.  Lipase 40 AST/ALT 27/13 T bil less than 0.1  Tn 0.06 x2  WBC 7.8 hemoglobin 14.9 platelets 207   glucose 82 urine negative  CT  of the head negative for acute intracranial abnormalities chest x-ray NAD   cardiology evaluation is pending   Review of Systems:  As per HPI otherwise all other  systems reviewed and are negative  Past Medical History:  Diagnosis Date  . Alcohol abuse   . Chest pain    a. 07/2012 Lexiscan Sestamibi:  no ischemia/infarct, nl EF.  Marland Kitchen HIV infection ([place])   . Hypertension   . Tobacco abuse     Past Surgical History:  Procedure Laterality Date  . WRIST FRACTURE SURGERY  ~ 2009   right; "put a plate in it" (01/77/9390)    Social History Social History   Social History  . Marital status: Single    Spouse name: N/A  . Number of children: N/A  . Years of education: N/A   Occupational History  . supervisor [employer] History Main Topics  . Smoking status: Current Every Day Smoker    Packs/day: 1.00    Years: 26.00    Types: Cigarettes  . Smokeless tobacco: Never Used     Comment: states he is trying to cut back  . Alcohol use 4.2 oz/week    5 Cans of beer, 2 Shots of liquor per week     Comment: #2 40oz/ day  . Drug use: No  . Sexual activity: Not Currently    Partners: Female    Birth control/ protection: Condom   Other Topics Concern  . Not on file   Social History Narrative  . No narrative on  file     Allergies  Allergen Reactions  . Genvoya [Elviteg-Cobic-Emtricit-Tenofaf] Rash    Family History  Problem Relation Age of Onset  . Other Unknown        No known heart disease      Prior to Admission medications   Medication Sig Start Date End Date Taking? Authorizing Provider  amLODipine (NORVASC) 5 MG tablet TAKE 1 TABLET BY MOUTH DAILY 05/09/17  Yes Michel Bickers, MD  emtricitabine-rilpivir-tenofovir AF (ODEFSEY) 200-25-25 MG TABS tablet Take 1 tablet by mouth daily. 05/20/17  Yes Comer, Okey Regal, MD  naproxen sodium (ANAPROX) 220 MG tablet Take 220 mg by mouth daily as needed (general pain).   Yes [provider]  metoprolol tartrate (LOPRESSOR) 50 MG tablet TAKE 1 TABLET(50 MG) BY MOUTH TWICE DAILY Patient not taking: Reported on 06/01/2017 05/09/17   Michel Bickers, MD    Physical  Exam:  Vitals:   06/01/17 1400 06/01/17 1415 06/01/17 1500 06/01/17 1515  BP: (!) 143/62 (!) 153/74 (!) 175/84 (!) 166/79  Pulse: 64 61 68 66  Resp: 18 18    Temp:      TempSrc:      SpO2: 98% 96% 95% 98%  Weight:      Height:       Constitutional: NAD, calm, comfortable  Eyes: PERRL, lids and conjunctivae normal ENMT: Mucous membranes are moist, without exudate or lesions  Neck: normal, supple, no masses, no thyromegaly Respiratory: clear to auscultation bilaterally, no wheezing, no crackles. Normal respiratory effort  Cardiovascular: Regular rate and rhythm, soft 1/6 murmurs, rubs or gallops. No extremity edema. 2+ pedal pulses. No carotid bruits.  Abdomen: Soft, non tender, No hepatosplenomegaly. Bowel sounds positive.  Musculoskeletal: no clubbing / cyanosis. Moves all extremities Skin: no jaundice, No lesions.  Neurologic: Sensation intact  Strength equal in all extremities Psychiatric:   Alert and oriented x 3. Normal mood.     Labs on Admission: I have personally reviewed following labs and imaging studies  CBC:  Recent Labs Lab 06/01/17 1140  WBC 7.8  HGB 14.9  HCT 44.5  MCV 90.1  PLT 970    Basic Metabolic Panel:  Recent Labs Lab 06/01/17 1140  NA 138  K 3.7  CL 107  CO2 24  GLUCOSE 82  BUN 5*  CREATININE 0.88  CALCIUM 8.6*    GFR: Estimated Creatinine Clearance: 110 mL/min (by C-G formula based on SCr of 0.88 mg/dL).  Liver Function Tests:  Recent Labs Lab 06/01/17 1140  AST 27  ALT 13*  ALKPHOS 76  BILITOT 0.5  PROT 7.8  ALBUMIN 3.4*    Recent Labs Lab 06/01/17 1140  LIPASE 40   No results for input(s): AMMONIA in the last 168 hours.  Coagulation Profile: No results for input(s): INR, PROTIME in the last 168 hours.  Cardiac Enzymes:  Recent Labs Lab 06/01/17 1140  TROPONINI 0.06*    BNP (last 3 results) No results for input(s): PROBNP in the last 8760 hours.  HbA1C: No results for input(s): HGBA1C in the last 72  hours.  CBG: No results for input(s): GLUCAP in the last 168 hours.  Lipid Profile: No results for input(s): CHOL, HDL, LDLCALC, TRIG, CHOLHDL, LDLDIRECT in the last 72 hours.  Thyroid Function Tests: No results for input(s): TSH, T4TOTAL, FREET4, T3FREE, THYROIDAB in the last 72 hours.  Anemia Panel: No results for input(s): VITAMINB12, FOLATE, FERRITIN, TIBC, IRON, RETICCTPCT in the last 72 hours.  Urine analysis:    Component Value  Date/Time   COLORURINE STRAW (A) 06/01/2017 1320   APPEARANCEUR CLEAR 06/01/2017 1320   LABSPEC 1.010 06/01/2017 1320   PHURINE 8.0 06/01/2017 1320   GLUCOSEU NEGATIVE 06/01/2017 1320   HGBUR NEGATIVE 06/01/2017 1320   BILIRUBINUR NEGATIVE 06/01/2017 1320   KETONESUR NEGATIVE 06/01/2017 1320   PROTEINUR NEGATIVE 06/01/2017 1320   UROBILINOGEN 1.0 01/29/2015 2249   NITRITE NEGATIVE 06/01/2017 1320   LEUKOCYTESUR NEGATIVE 06/01/2017 1320    Sepsis Labs: @LABRCNTIP (procalcitonin:4,lacticidven:4) )No results found for this or any previous visit (from the past 240 hour(s)).   Radiological Exams on Admission: Dg Chest 2 View  Result Date: 06/01/2017 CLINICAL DATA:  Hypertension, dizziness EXAM: CHEST  2 VIEW COMPARISON:  01/18/2016 FINDINGS: Mild bilateral lower lobe opacities, chronic, likely atelectasis/scarring. No pleural effusion or pneumothorax. The heart is top normal in size. Mild degenerative changes of the visualized thoracolumbar spine. IMPRESSION: No evidence of acute cardiopulmonary disease. Electronically Signed   By: Julian Hy M.D.   On: 06/01/2017 11:23   Ct Head Wo Contrast  Result Date: 06/01/2017 CLINICAL DATA:  Hypertension EXAM: CT HEAD WITHOUT CONTRAST TECHNIQUE: Contiguous axial images were obtained from the base of the skull through the vertex without intravenous contrast. COMPARISON:  06/01/2013 FINDINGS: Brain: No evidence of acute infarction, hemorrhage, extra-axial collection or mass lesion/mass effect. Stable  choroid or ependymal cyst in the left lateral ventricle (series 3/image 17). No hydrocephalus. Mild degenerative changes of the visualized thoracolumbar spine. Vascular: No hyperdense vessel or unexpected calcification. Skull: Normal. Negative for fracture or focal lesion. 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 Visualized paranasal sinuses and mastoid air cells are otherwise clear. Other: None. IMPRESSION: No evidence of acute intracranial abnormality. Electronically Signed   By: Julian Hy M.D.   On: 06/01/2017 11:31    EKG: Independently reviewed.  Assessment/Plan Active Problems:   Hypertensive urgency   Hypertension   Tobacco abuse   HIV disease ([place])   Alcohol abuse    Hypertensive Urgency in the setting of tobacco and alcohol abuse. He was symptomatic with nausea  On admission 203/134 , Currently at 166/79  Patient had not been taking Norvasc as OP . Not encephalopathic . Tn 0.06 EKG without ACS  Tele Obs  Continue home meds, resume Norvasc  Check serial Troponin , EKG  IVF  Hydralazine 5-10 mg IV q 6 hrs prn for 160/90 Cardiology to consult, per EDP  Check Echo cardiogram Other recommendations as per Cards will need closed f/u as OP Zofran prn   HIV, followed by Dr Megan Salon as OP, patient compliant with meds.  No intervention is indicated at this time   Alcohol abuse and dependence and at risk for withdrawal  Telemetry CIWA with Ativan per protocol  Thiamine, folate, and MVI Will need follow up with PCP as OP to address this issue as he wants to quit   Tobacco abuse with nicotine withdrawal Unable to provide Nicotine due to hypertensive urgency.  Counseled cessation    DVT prophylaxis: Lovenox   Code Status:   Full      Family Communication:  Discussed with patient Disposition Plan: Expect patient to be discharged to home after condition improves Consults called:    Cards per EDP Admission status:Tele   Obs     [facility] E, PA-C [facility]   06/01/2017, 3:43 PM

## 2017-06-01 NOTE — ED Notes (Signed)
Per Dr. Erma Heritage, pt agreed to admission.

## 2017-06-02 DIAGNOSIS — B20 Human immunodeficiency virus [HIV] disease: Secondary | ICD-10-CM

## 2017-06-02 DIAGNOSIS — Z720 Tobacco use: Secondary | ICD-10-CM

## 2017-06-02 DIAGNOSIS — F1010 Alcohol abuse, uncomplicated: Secondary | ICD-10-CM

## 2017-06-02 DIAGNOSIS — I161 Hypertensive emergency: Secondary | ICD-10-CM

## 2017-06-02 LAB — COMPREHENSIVE METABOLIC PANEL
ALBUMIN: 2.9 g/dL — AB (ref 3.5–5.0)
ALT: 11 U/L — ABNORMAL LOW (ref 17–63)
AST: 21 U/L (ref 15–41)
Alkaline Phosphatase: 64 U/L (ref 38–126)
Anion gap: 7 (ref 5–15)
BUN: 9 mg/dL (ref 6–20)
CHLORIDE: 107 mmol/L (ref 101–111)
CO2: 24 mmol/L (ref 22–32)
Calcium: 8.5 mg/dL — ABNORMAL LOW (ref 8.9–10.3)
Creatinine, Ser: 0.96 mg/dL (ref 0.61–1.24)
GFR calc Af Amer: >60 mL/min (ref >60)
GFR calc non Af Amer: >60 mL/min (ref >60)
GLUCOSE: 105 mg/dL — AB (ref 65–99)
POTASSIUM: 3.6 mmol/L (ref 3.5–5.1)
SODIUM: 138 mmol/L (ref 135–145)
Total Bilirubin: 0.8 mg/dL (ref 0.3–1.2)
Total Protein: 6.8 g/dL (ref 6.5–8.1)

## 2017-06-02 LAB — CBC
HEMATOCRIT: 41.8 % (ref 39.0–52.0)
Hemoglobin: 14 g/dL (ref 13.0–17.0)
MCH: 30.3 pg (ref 26.0–34.0)
MCHC: 33.5 g/dL (ref 30.0–36.0)
MCV: 90.5 fL (ref 78.0–100.0)
Platelets: 187 10*3/uL (ref 150–400)
RBC: 4.62 MIL/uL (ref 4.22–5.81)
RDW: 14.2 % (ref 11.5–15.5)
WBC: 6.1 10*3/uL (ref 4.0–10.5)

## 2017-06-02 MED ORDER — METOPROLOL TARTRATE 50 MG PO TABS: 50.0000 mg | ORAL_TABLET | Freq: Two times a day (BID) | ORAL | 2 refills | Status: DC

## 2017-06-02 MED ORDER — EMTRICITAB-RILPIVIR-TENOFOV AF 200-25-25 MG PO TABS: 1.0000 | ORAL_TABLET | Freq: Every day | ORAL | Status: DC

## 2017-06-02 MED ORDER — METOPROLOL TARTRATE 50 MG PO TABS
50.0000 mg | ORAL_TABLET | Freq: Two times a day (BID) | ORAL | Status: DC
  Administered 2017-06-02: 50 mg via ORAL
  Filled 2017-06-02: qty 1

## 2017-06-02 NOTE — Progress Notes (Signed)
Patient given discharge instructions and all questions answered.  

## 2017-06-02 NOTE — Discharge Summary (Signed)
Physician Discharge Summary  Brandon Quinn DDU:[id number] DOB: [birth year] DOA: 06/01/2017  PCP: Brandon Asters, MD  Admit date: 06/01/2017 Discharge date: 06/02/2017  Admitted From: home  Disposition:  home   Recommendations for Outpatient Follow-up:  1. F/u BP and add medications as needed  Home Health:  none  Equipment/Devices:  none    Discharge Condition:  stable   CODE STATUS:  Full code   Consultations:  none    Discharge Diagnoses:  Principal Problem:   Hypertensive emergency Active Problems:   Tobacco abuse   HIV disease ([facility])   Alcohol abuse   Subjective: Left arm numbness has resolved. He has no dyspnea or chest pain.   Brief Summary: Brandon Quinn is a 51 y.o. male with medical history significant for HTN, HIV, chronic tobacco and alcohol abuse, presenting with L sided tingling, nausea and vomiting after a night of heavy drinking. Symptoms reported to be similar to prior intoxicated events.  Hospital Course:  Left arm numbness/ tingling due to HTN emergency - BM improved and symptoms resolved- have asked why is is no longer on metoprolol and he states he stopped it himself for no reason- advised him to resume it - resumed Norvasc which he was taking at home - have advised him and fiance to ensure he gets a BP apparatus and check BPs at home  Mildly elevated troponin with flat trend - likely due to HTN emergency- no further work up at this time - explained that he needs to control BP and stop smoking to prevent heart disease and CHF  Tobacco abuse - advised to quit smoking  Alcohol use - does not drink every day- advised to drink in moderation  HIV - follows with dr Orvan Falconer and states he takes his medications appropriately   Discharge Instructions  Discharge Instructions    Diet - low sodium heart healthy    Complete by:  As directed    Increase activity slowly    Complete by:  As directed      Allergies as of 06/02/2017      Reactions   Genvoya  [elviteg-cobic-emtricit-tenofaf] Rash      Medication List    STOP taking these medications   naproxen sodium 220 MG tablet Commonly known as:  ANAPROX     TAKE these medications   amLODipine 5 MG tablet Commonly known as:  NORVASC TAKE 1 TABLET BY MOUTH DAILY   emtricitabine-rilpivir-tenofovir AF 200-25-25 MG Tabs tablet Commonly known as:  ODEFSEY Take 1 tablet by mouth daily.   metoprolol tartrate 50 MG tablet Commonly known as:  LOPRESSOR Take 1 tablet (50 mg total) by mouth 2 (two) times daily. What changed:  See the new instructions.            Discharge Care Instructions        Start     Ordered   06/02/17 0000  metoprolol tartrate (LOPRESSOR) 50 MG tablet  2 times daily     06/02/17 1103   06/02/17 0000  Increase activity slowly     06/02/17 1103   06/02/17 0000  Diet - low sodium heart healthy     06/02/17 1103     Follow-up Information    Brandon Asters, MD.   Specialty:  Infectious Diseases Why:  Message left to call patient at home and schedule follow-up appt Contact information: [address] [place] [address] [phone]          Allergies  Allergen Reactions  .  Genvoya [Elviteg-Cobic-Emtricit-Tenofaf] Rash     Procedures/Studies:   Dg Chest 2 View  Result Date: 06/01/2017 CLINICAL DATA:  Hypertension, dizziness EXAM: CHEST  2 VIEW COMPARISON:  01/18/2016 FINDINGS: Mild bilateral lower lobe opacities, chronic, likely atelectasis/scarring. No pleural effusion or pneumothorax. The heart is top normal in size. Mild degenerative changes of the visualized thoracolumbar spine. IMPRESSION: No evidence of acute cardiopulmonary disease. Electronically Signed   By: Charline Bills M.D.   On: 06/01/2017 11:23   Ct Head Wo Contrast  Result Date: 06/01/2017 CLINICAL DATA:  Hypertension EXAM: CT HEAD WITHOUT CONTRAST TECHNIQUE: Contiguous axial images were obtained from the base of the skull through the vertex without  intravenous contrast. COMPARISON:  06/01/2013 FINDINGS: Brain: No evidence of acute infarction, hemorrhage, extra-axial collection or mass lesion/mass effect. Stable choroid or ependymal cyst in the left lateral ventricle (series 3/image 17). No hydrocephalus. Mild degenerative changes of the visualized thoracolumbar spine. Vascular: No hyperdense vessel or unexpected calcification. Skull: Normal. Negative for fracture or focal lesion. 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 Visualized paranasal sinuses and mastoid air cells are otherwise clear. Other: None. IMPRESSION: No evidence of acute intracranial abnormality. Electronically Signed   By: Charline Bills M.D.   On: 06/01/2017 11:31       Discharge Exam: Vitals:   06/02/17 0700 06/02/17 0852  BP: (!) 154/60 (!) 163/58  Pulse: 62 81  Resp: 18 18  Temp: 98 F (36.7 C) 98.6 F (37 C)  SpO2: 100% 100%   Vitals:   06/02/17 0341 06/02/17 0600 06/02/17 0700 06/02/17 0852  BP: (!) 166/81 (!) 156/62 (!) 154/60 (!) 163/58  Pulse: 64  62 81  Resp: 18  18 18   Temp: 98.3 F (36.8 C)  98 F (36.7 C) 98.6 F (37 C)  TempSrc: Oral  Oral Oral  SpO2: 99%  100% 100%  Weight:  89.2 kg (196 lb 11.2 oz)    Height:        General: Pt is alert, awake, not in acute distress Cardiovascular: RRR, S1/S2 +, no rubs, no gallops Respiratory: CTA bilaterally, no wheezing, no rhonchi Abdominal: Soft, NT, ND, bowel sounds + Extremities: no edema, no cyanosis    The results of significant diagnostics from this hospitalization (including imaging, microbiology, ancillary and laboratory) are listed below for reference.     Microbiology: No results found for this or any previous visit (from the past 240 hour(s)).   Labs: BNP (last 3 results) No results for input(s): BNP in the last 8760 hours. Basic Metabolic Panel:  Recent Labs Lab 06/01/17 1140 06/02/17 0452  NA 138 138  K 3.7 3.6  CL  107 107  CO2 24 24  GLUCOSE 82 105*  BUN 5* 9  CREATININE 0.88 0.96  CALCIUM 8.6* 8.5*   Liver Function Tests:  Recent Labs Lab 06/01/17 1140 06/02/17 0452  AST 27 21  ALT 13* 11*  ALKPHOS 76 64  BILITOT 0.5 0.8  PROT 7.8 6.8  ALBUMIN 3.4* 2.9*    Recent Labs Lab 06/01/17 1140  LIPASE 40   No results for input(s): AMMONIA in the last 168 hours. CBC:  Recent Labs Lab 06/01/17 1140 06/02/17 0452  WBC 7.8 6.1  HGB 14.9 14.0  HCT 44.5 41.8  MCV 90.1 90.5  PLT 207 187   Cardiac Enzymes:  Recent Labs Lab 06/01/17 1140 06/01/17 1610 06/01/17 1800  TROPONINI 0.06* 0.06* 0.06*   BNP: Invalid input(s): POCBNP CBG:  No results for input(s): GLUCAP in the last 168 hours. D-Dimer No results for input(s): DDIMER in the last 72 hours. Hgb A1c No results for input(s): HGBA1C in the last 72 hours. Lipid Profile No results for input(s): CHOL, HDL, LDLCALC, TRIG, CHOLHDL, LDLDIRECT in the last 72 hours. Thyroid function studies No results for input(s): TSH, T4TOTAL, T3FREE, THYROIDAB in the last 72 hours.  Invalid input(s): FREET3 Anemia work up No results for input(s): VITAMINB12, FOLATE, FERRITIN, TIBC, IRON, RETICCTPCT in the last 72 hours. Urinalysis    Component Value Date/Time   COLORURINE STRAW (A) 06/01/2017 1320   APPEARANCEUR CLEAR 06/01/2017 1320   LABSPEC 1.010 06/01/2017 1320   PHURINE 8.0 06/01/2017 1320   GLUCOSEU NEGATIVE 06/01/2017 1320   HGBUR NEGATIVE 06/01/2017 1320   BILIRUBINUR NEGATIVE 06/01/2017 1320   KETONESUR NEGATIVE 06/01/2017 1320   PROTEINUR NEGATIVE 06/01/2017 1320   UROBILINOGEN 1.0 01/29/2015 2249   NITRITE NEGATIVE 06/01/2017 1320   LEUKOCYTESUR NEGATIVE 06/01/2017 1320   Sepsis Labs Invalid input(s): PROCALCITONIN,  WBC,  LACTICIDVEN Microbiology No results found for this or any previous visit (from the past 240 hour(s)).   Time coordinating discharge: Over 30 minutes  SIGNED:   Calvert Cantor, MD  [facility] 06/02/2017, 2:48 PM Pager   If 7PM-7AM, please contact night-coverage [email or web address] Password [email or web address]

## 2017-06-09 ENCOUNTER — Ambulatory Visit (HOSPITAL_COMMUNITY)
Admission: EM | Admit: 2017-06-09 | Discharge: 2017-06-09 | Disposition: A | Discharge status: Home / Self Care | Payer: Self-pay

## 2017-06-09 ENCOUNTER — Encounter (HOSPITAL_COMMUNITY): Payer: Self-pay | Admitting: *Deleted

## 2017-06-09 DIAGNOSIS — F1010 Alcohol abuse, uncomplicated: Secondary | ICD-10-CM

## 2017-06-09 DIAGNOSIS — R42 Dizziness and giddiness: Secondary | ICD-10-CM

## 2017-06-09 DIAGNOSIS — I10 Essential (primary) hypertension: Secondary | ICD-10-CM

## 2017-06-09 NOTE — Discharge Instructions (Signed)
No more alcohol-containing beverages. This can cause dehydration especially if you have to work in the heat. Drink plenty of fluids today. Take the extra Norvasc 5 mg today, (taken while in the urgent care). Home and rest. Follow-up with your primary care doctor as scheduled. For worsening, new symptoms or problems may return or if necessary get emergency department.

## 2017-06-09 NOTE — ED Provider Notes (Signed)
[facility]    CSN: [id number] Arrival date & time: 06/09/17  1344     History   Chief Complaint Chief Complaint  Patient presents with  . Dizziness    HPI Brandon Quinn is a 51 y.o. male.   51 year old male who has a history of alcohol abuse states that he presented to the urgent care today because he was feeling a little dizzy and weak. He had consumed 6-722 ounce malt liquor/beer-type beverages last evening. He has a history of alcohol consumption with similar symptoms treated in the emergency department. Upon entering the room the patient had been waiting for several minutes and stated he was feeling much better. He no longer had dizziness. He states he felt quite normal. He had no complaints. He states he felt stronger, denying chest pain, heaviness, tightness, pressure in the chest, shortness of breath, swelling, dizziness or weakness. He states he is ready to go and does not need anything else. He is concerned about his blood pressure being elevated. He has a history of noncompliance the states he has been taking his blood pressure regularly for the past few days. Current medications include Norvasc 5 mg and Lopressor 50 mg twice a day.      Past Medical History:  Diagnosis Date  . Alcohol abuse   . Chest pain    a. 07/2012 Lexiscan Sestamibi:  no ischemia/infarct, nl EF.  Marland Kitchen HIV infection (HCC)   . Hypertension   . Tobacco abuse     Patient Active Problem List   Diagnosis Date Noted  . Hypertensive emergency 06/02/2017  . Alcohol abuse 06/01/2017  . Pain, dental 07/29/2016  . Cough 07/29/2016  . Rash 06/23/2015  . HIV disease (HCC) 05/18/2015  . Acute gouty arthritis 01/30/2015  . Oral thrush 01/30/2015  . Tobacco abuse 07/30/2012    Past Surgical History:  Procedure Laterality Date  . WRIST FRACTURE SURGERY  ~ 2009   right; "put a plate in it" (74/25/9563)       Home Medications    Prior to Admission medications   Medication Sig Start Date  End Date Taking? Authorizing Provider  amLODipine (NORVASC) 5 MG tablet TAKE 1 TABLET BY MOUTH DAILY 05/09/17   Cliffton Asters, MD  emtricitabine-rilpivir-tenofovir AF (ODEFSEY) 200-25-25 MG TABS tablet Take 1 tablet by mouth daily. 05/20/17   Comer, Belia Heman, MD  metoprolol tartrate (LOPRESSOR) 50 MG tablet Take 1 tablet (50 mg total) by mouth 2 (two) times daily. 06/02/17   Calvert Cantor, MD    Family History Family History  Problem Relation Age of Onset  . Other Unknown        No known heart disease    Social History Social History  Substance Use Topics  . Smoking status: Current Every Day Smoker    Packs/day: 1.00    Years: 26.00    Types: Cigarettes  . Smokeless tobacco: Never Used     Comment: states he is trying to cut back  . Alcohol use 4.2 oz/week    5 Cans of beer, 2 Shots of liquor per week     Comment: #2 40oz/ day     Allergies   Genvoya [elviteg-cobic-emtricit-tenofaf]   Review of Systems Review of Systems  Constitutional: Negative.   Respiratory: Negative.   Cardiovascular: Negative.  Negative for chest pain and leg swelling.  Gastrointestinal: Negative.  Negative for abdominal pain, blood in stool, nausea and vomiting.  Genitourinary: Negative.   Musculoskeletal: Negative.   Skin: Negative.  Neurological: Positive for dizziness.  Hematological: Negative.   All other systems reviewed and are negative.    Physical Exam Triage Vital Signs ED Triage Vitals  Enc Vitals Group     BP 06/09/17 1407 (!) 200/85     Pulse Rate 06/09/17 1407 72     Resp 06/09/17 1407 18     Temp 06/09/17 1407 98.6 F (37 C)     Temp Source 06/09/17 1407 Oral     SpO2 06/09/17 1407 100 %     Weight --      Height --      Head Circumference --      Peak Flow --      Pain Score 06/09/17 1408 0     Pain Loc --      Pain Edu? --      Excl. in GC? --    No data found.   Updated Vital Signs BP (!) 200/85 (BP Location: Left Arm)   Pulse 72   Temp 98.6 F (37 C)  (Oral)   Resp 18   SpO2 100%   Visual Acuity Right Eye Distance:   Left Eye Distance:   Bilateral Distance:    Right Eye Near:   Left Eye Near:    Bilateral Near:     Physical Exam  Constitutional: He is oriented to person, place, and time. He appears well-developed and well-nourished. No distress.  HENT:  Head: Normocephalic and atraumatic.  Eyes: EOM are normal.  Neck: Normal range of motion. Neck supple.  Cardiovascular: Normal rate, regular rhythm and normal heart sounds.   Pulmonary/Chest: Effort normal and breath sounds normal. No respiratory distress. He has no wheezes.  Abdominal: Soft. He exhibits no distension. There is no tenderness. There is no guarding.  Musculoskeletal: Normal range of motion. He exhibits no edema or tenderness.  Neurological: He is alert and oriented to person, place, and time. No cranial nerve deficit or sensory deficit. He exhibits normal muscle tone. Coordination and gait normal. GCS eye subscore is 4. GCS verbal subscore is 5. GCS motor subscore is 6.  Skin: Skin is warm and dry. No rash noted.  Psychiatric: He has a normal mood and affect.  Nursing note and vitals reviewed.    UC Treatments / Results  Labs (all labs ordered are listed, but only abnormal results are displayed) Labs Reviewed - No data to display  EKG  EKG Interpretation None       Radiology No results found.  Procedures Procedures (including critical care time)  Medications Ordered in UC Medications - No data to display   Initial Impression / Assessment and Plan / UC Course  I have reviewed the triage vital signs and the nursing notes.  Pertinent labs & imaging results that were available during my care of the patient were reviewed by me and considered in my medical decision making (see chart for details).     No more alcohol-containing beverages. This can cause dehydration especially if you have to work in the heat. Drink plenty of fluids today. Take the  extra Norvasc 5 mg today, (taken while in the urgent care). Home and rest. Follow-up with your primary care doctor as scheduled. For worsening, new symptoms or problems may return or if necessary get emergency department.   Final Clinical Impressions(s) / UC Diagnoses   Final diagnoses:  Dizziness  Essential hypertension  Alcohol abuse    New Prescriptions New Prescriptions   No medications on file  Controlled Substance Prescriptions [place] Controlled Substance Registry consulted? Not Applicable   Hayden RasmussenMabe, Niya Behler, NP 06/09/17 1524

## 2017-06-09 NOTE — ED Triage Notes (Signed)
Pt    Woke  Up  This   Am   Dizzy        This   Am   And   Weak         Was    Seen  Last   Week    For   Similar    Symptoms       Pt  States    He  Drank      Some  Beer   Last  Pm        No  Nausea  No  Pain       Awake   And  Alert   Drove  Himself to  Clinic

## 2017-07-03 ENCOUNTER — Ambulatory Visit: Payer: Self-pay | Admitting: Internal Medicine

## 2017-07-08 ENCOUNTER — Telehealth: Payer: Self-pay

## 2017-07-08 ENCOUNTER — Ambulatory Visit (INDEPENDENT_AMBULATORY_CARE_PROVIDER_SITE_OTHER): Payer: BLUE CROSS/BLUE SHIELD | Admitting: Internal Medicine

## 2017-07-08 DIAGNOSIS — I10 Essential (primary) hypertension: Secondary | ICD-10-CM | POA: Diagnosis not present

## 2017-07-08 DIAGNOSIS — B20 Human immunodeficiency virus [HIV] disease: Secondary | ICD-10-CM

## 2017-07-08 DIAGNOSIS — F1010 Alcohol abuse, uncomplicated: Secondary | ICD-10-CM | POA: Diagnosis not present

## 2017-07-08 NOTE — Progress Notes (Signed)
Patient Active Problem List   Diagnosis Date Noted  . HIV disease (HCC) 05/18/2015    Priority: High  . Hypertensive emergency 06/02/2017  . Alcohol abuse 06/01/2017  . Pain, dental 07/29/2016  . Cough 07/29/2016  . Rash 06/23/2015  . Acute gouty arthritis 01/30/2015  . Oral thrush 01/30/2015  . Tobacco abuse 07/30/2012  . Hypertension 07/29/2012    Patient's Medications  New Prescriptions   No medications on file  Previous Medications   AMLODIPINE (NORVASC) 5 MG TABLET    TAKE 1 TABLET BY MOUTH DAILY   EMTRICITABINE-RILPIVIR-TENOFOVIR AF (ODEFSEY) 200-25-25 MG TABS TABLET    Take 1 tablet by mouth daily.   METOPROLOL TARTRATE (LOPRESSOR) 50 MG TABLET    Take 1 tablet (50 mg total) by mouth 2 (two) times daily.  Modified Medications   No medications on file  Discontinued Medications   No medications on file    Subjective: Brandon Quinn is in for his routine HIV follow-up visit He states that he has had no problems obtaining, taking or tolerating his Odefsey. He takes it each morning around 10 while he is at work. He is now working as a Copy at [employer]. He just qualified for his [employer] He does not have a primary care provider. He has been in the emergency department 3 times recently after drinking alcohol heavily and having hypertensive urgency. He has been prescribed amlodipine and metoprolol but has not been taking them because he says they make him feel dizzy. After his last ED visit he tells me that he quit drinking liquor and cut down on his consumption of beer.  Review of Systems: Review of Systems  Constitutional: Negative for chills, diaphoresis, fever, malaise/fatigue and weight loss.  HENT: Negative for sore throat.   Respiratory: Negative for cough, sputum production and shortness of breath.   Cardiovascular: Negative for chest pain.  Gastrointestinal: Negative for abdominal pain, diarrhea, heartburn, nausea and  vomiting.  Genitourinary: Negative for dysuria and frequency.  Musculoskeletal: Negative for joint pain and myalgias.  Skin: Negative for rash.  Neurological: Positive for dizziness. Negative for headaches.  Psychiatric/Behavioral: Positive for substance abuse. Negative for depression. The patient is not nervous/anxious.     Past Medical History:  Diagnosis Date  . Alcohol abuse   . Chest pain    a. 07/2012 Lexiscan Sestamibi:  no ischemia/infarct, nl EF.  Marland Kitchen HIV infection (HCC)   . Hypertension   . Tobacco abuse     Social History  Substance Use Topics  . Smoking status: Current Every Day Smoker    Packs/day: 1.00    Years: 26.00    Types: Cigarettes  . Smokeless tobacco: Never Used     Comment: states he is trying to cut back  . Alcohol use 4.2 oz/week    5 Cans of beer, 2 Shots of liquor per week     Comment: #2 40oz/ day    Family History  Problem Relation Age of Onset  . Other Unknown        No known heart disease    Allergies  Allergen Reactions  . Genvoya [Elviteg-Cobic-Emtricit-Tenofaf] Rash    Objective:  Vitals:   07/08/17 1003  BP: (!) 194/95  Pulse: 74  Temp: 98.5 F (36.9 C)  Weight: 199 lb 12.8 oz (90.6 kg)   Body mass index is 27.1 kg/m.  Physical Exam  Constitutional: He is oriented to person, place, and  time.  HENT:  Mouth/Throat: No oropharyngeal exudate.  Eyes: Conjunctivae are normal.  Cardiovascular: Normal rate and regular rhythm.   No murmur heard. Pulmonary/Chest: Effort normal and breath sounds normal.  Abdominal: Soft. He exhibits no mass. There is no tenderness.  Musculoskeletal: Normal range of motion.  Neurological: He is alert and oriented to person, place, and time.  Skin: No rash noted.  Psychiatric: Mood and affect normal.    Lab Results Lab Results  Component Value Date   WBC 6.1 06/02/2017   HGB 14.0 06/02/2017   HCT 41.8 06/02/2017   MCV 90.5 06/02/2017   PLT 187 06/02/2017    Lab Results  Component  Value Date   CREATININE 0.96 06/02/2017   BUN 9 06/02/2017   NA 138 06/02/2017   K 3.6 06/02/2017   CL 107 06/02/2017   CO2 24 06/02/2017    Lab Results  Component Value Date   ALT 11 (L) 06/02/2017   AST 21 06/02/2017   ALKPHOS 64 06/02/2017   BILITOT 0.8 06/02/2017    Lab Results  Component Value Date   CHOL 245 (H) 04/08/2017   HDL 49 04/08/2017   LDLCALC 168 (H) 04/08/2017   TRIG 138 04/08/2017   CHOLHDL 5.0 (H) 04/08/2017   Lab Results  Component Value Date   LABRPR NON REAC 04/08/2017   HIV 1 RNA Quant (copies/mL)  Date Value  04/08/2017 <20 NOT DETECTED  07/29/2016 <20  01/09/2016 <20   CD4 T Cell Abs (/uL)  Date Value  04/08/2017 280 (L)  07/29/2016 230 (L)  01/09/2016 220 (L)     Problem List Items Addressed This Visit      High   HIV disease (HCC)    His infection has been under excellent, long term control since starting Odefsey 2 years ago. He is having steady CD4 reconstitution. He will continue Odefsey and follow-up after blood work in 6 months.      Relevant Orders   T-helper cell (CD4)- ([facility] clinic only)   HIV 1 RNA quant-no reflex-bld   CBC   Comprehensive metabolic panel   Lipid panel   RPR     Unprioritized   Alcohol abuse    I talked to him about the direct relationship between his heavy alcohol consumption and uncontrolled blood pressure. I encouraged him to cut down on his alcohol use.      Hypertension    He has uncontrolled hypertension exacerbated by his recent heavy alcohol consumption. He is not taking his antihypertensive agents because of side effects. We will help him find a primary care provider.           Cliffton Asters, MD [facility] [phone] pager   [phone] cell 07/08/2017, 10:27 AM

## 2017-07-08 NOTE — Telephone Encounter (Signed)
Per verbal order by Dr. Orvan Falconer I contacted [facility] to establish a PCP for Brandon Quinn. Brandon Quinn was scheduled for an appt with Dr. Montez Morita on 08/05/17 at 8:45. They instructed the pt to fill out a packet which will be delivered to his house and he would need to fill it out and return it to the office one week prior to his appt.  He would need to bring his insurance card and any medication he is currently taking with him to his appt. When I called Brandon Quinn about his appt with Dr. Montez Morita he stated that he would like an earlier appt then the one already scheduled. I gave Brandon Quinn their number [phone] to call and discuss moving the appt earlier if possible. I also relayed the instructions that [facility] told me back to the pt. He repeated the instructions back to me before ending the call.  Lorenso Courier, [place]

## 2017-07-08 NOTE — Assessment & Plan Note (Signed)
He has uncontrolled hypertension exacerbated by his recent heavy alcohol consumption. He is not taking his antihypertensive agents because of side effects. We will help him find a primary care provider.

## 2017-07-08 NOTE — Assessment & Plan Note (Signed)
His infection has been under excellent, long term control since starting Odefsey 2 years ago. He is having steady CD4 reconstitution. He will continue Odefsey and follow-up after blood work in 6 months.

## 2017-07-08 NOTE — Assessment & Plan Note (Signed)
I talked to him about the direct relationship between his heavy alcohol consumption and uncontrolled blood pressure. I encouraged him to cut down on his alcohol use.

## 2017-07-14 ENCOUNTER — Other Ambulatory Visit: Payer: Self-pay | Admitting: *Deleted

## 2017-07-14 DIAGNOSIS — B20 Human immunodeficiency virus [HIV] disease: Secondary | ICD-10-CM

## 2017-07-14 MED ORDER — EMTRICITAB-RILPIVIR-TENOFOV AF 200-25-25 MG PO TABS: 1.0000 | ORAL_TABLET | Freq: Every day | ORAL | 2 refills | Status: DC

## 2017-07-17 ENCOUNTER — Telehealth: Payer: Self-pay | Admitting: *Deleted

## 2017-07-17 NOTE — Telephone Encounter (Signed)
Caryn Bee with [facility] called to advise they are sending the patient medication here as he has no address at the moment and can come here to pick it up when it comes. Advised it should be fine as long as he knows to come and get it. And he has to know it is temporary until he gets a new place.

## 2017-08-05 ENCOUNTER — Ambulatory Visit: Payer: BLUE CROSS/BLUE SHIELD | Admitting: Internal Medicine

## 2017-08-14 ENCOUNTER — Telehealth: Payer: Self-pay

## 2017-08-14 NOTE — Telephone Encounter (Signed)
Odefsey received from [facility] via US mail.   Patient informed .  Laurell Josephsammy K Tineka Uriegas, RN

## 2017-08-18 DIAGNOSIS — I10 Essential (primary) hypertension: Secondary | ICD-10-CM | POA: Diagnosis not present

## 2017-08-18 DIAGNOSIS — I639 Cerebral infarction, unspecified: Secondary | ICD-10-CM | POA: Diagnosis not present

## 2017-08-18 DIAGNOSIS — F17210 Nicotine dependence, cigarettes, uncomplicated: Secondary | ICD-10-CM | POA: Diagnosis not present

## 2017-08-18 DIAGNOSIS — I6523 Occlusion and stenosis of bilateral carotid arteries: Secondary | ICD-10-CM | POA: Diagnosis not present

## 2017-08-18 DIAGNOSIS — R072 Precordial pain: Secondary | ICD-10-CM | POA: Diagnosis not present

## 2017-08-18 DIAGNOSIS — R079 Chest pain, unspecified: Secondary | ICD-10-CM | POA: Diagnosis not present

## 2017-08-18 DIAGNOSIS — F17200 Nicotine dependence, unspecified, uncomplicated: Secondary | ICD-10-CM | POA: Diagnosis not present

## 2017-08-18 DIAGNOSIS — R7989 Other specified abnormal findings of blood chemistry: Secondary | ICD-10-CM | POA: Diagnosis not present

## 2017-08-18 DIAGNOSIS — R0789 Other chest pain: Secondary | ICD-10-CM | POA: Diagnosis not present

## 2017-08-18 DIAGNOSIS — R05 Cough: Secondary | ICD-10-CM | POA: Diagnosis not present

## 2017-08-18 DIAGNOSIS — R748 Abnormal levels of other serum enzymes: Secondary | ICD-10-CM | POA: Diagnosis not present

## 2017-08-18 DIAGNOSIS — I517 Cardiomegaly: Secondary | ICD-10-CM | POA: Diagnosis not present

## 2017-08-18 DIAGNOSIS — R531 Weakness: Secondary | ICD-10-CM | POA: Diagnosis not present

## 2017-08-18 DIAGNOSIS — B20 Human immunodeficiency virus [HIV] disease: Secondary | ICD-10-CM | POA: Diagnosis not present

## 2017-08-18 DIAGNOSIS — I160 Hypertensive urgency: Secondary | ICD-10-CM | POA: Diagnosis not present

## 2017-08-18 DIAGNOSIS — I63412 Cerebral infarction due to embolism of left middle cerebral artery: Secondary | ICD-10-CM | POA: Diagnosis not present

## 2017-08-18 DIAGNOSIS — R29898 Other symptoms and signs involving the musculoskeletal system: Secondary | ICD-10-CM | POA: Diagnosis not present

## 2017-08-18 DIAGNOSIS — M6281 Muscle weakness (generalized): Secondary | ICD-10-CM | POA: Diagnosis not present

## 2017-08-19 ENCOUNTER — Ambulatory Visit: Payer: Self-pay | Admitting: Internal Medicine

## 2017-08-19 DIAGNOSIS — R531 Weakness: Secondary | ICD-10-CM | POA: Diagnosis not present

## 2017-08-19 DIAGNOSIS — I160 Hypertensive urgency: Secondary | ICD-10-CM | POA: Diagnosis not present

## 2017-08-19 DIAGNOSIS — F17200 Nicotine dependence, unspecified, uncomplicated: Secondary | ICD-10-CM | POA: Diagnosis not present

## 2017-08-19 DIAGNOSIS — R748 Abnormal levels of other serum enzymes: Secondary | ICD-10-CM | POA: Diagnosis not present

## 2017-08-19 DIAGNOSIS — R05 Cough: Secondary | ICD-10-CM | POA: Diagnosis not present

## 2017-08-19 DIAGNOSIS — F17210 Nicotine dependence, cigarettes, uncomplicated: Secondary | ICD-10-CM | POA: Diagnosis not present

## 2017-08-19 DIAGNOSIS — B20 Human immunodeficiency virus [HIV] disease: Secondary | ICD-10-CM | POA: Diagnosis not present

## 2017-08-19 DIAGNOSIS — I10 Essential (primary) hypertension: Secondary | ICD-10-CM | POA: Diagnosis not present

## 2017-08-19 DIAGNOSIS — Z8673 Personal history of transient ischemic attack (TIA), and cerebral infarction without residual deficits: Secondary | ICD-10-CM | POA: Diagnosis not present

## 2017-08-19 DIAGNOSIS — R072 Precordial pain: Secondary | ICD-10-CM | POA: Diagnosis not present

## 2017-08-19 DIAGNOSIS — I639 Cerebral infarction, unspecified: Secondary | ICD-10-CM | POA: Diagnosis not present

## 2017-08-19 DIAGNOSIS — Z7982 Long term (current) use of aspirin: Secondary | ICD-10-CM | POA: Diagnosis not present

## 2017-08-19 DIAGNOSIS — R29898 Other symptoms and signs involving the musculoskeletal system: Secondary | ICD-10-CM | POA: Diagnosis not present

## 2017-09-09 ENCOUNTER — Encounter (HOSPITAL_BASED_OUTPATIENT_CLINIC_OR_DEPARTMENT_OTHER): Payer: Self-pay | Admitting: Emergency Medicine

## 2017-09-09 ENCOUNTER — Other Ambulatory Visit: Payer: Self-pay

## 2017-09-09 ENCOUNTER — Emergency Department (HOSPITAL_BASED_OUTPATIENT_CLINIC_OR_DEPARTMENT_OTHER)
Admission: EM | Admit: 2017-09-09 | Discharge: 2017-09-10 | Disposition: A | Discharge status: Home / Self Care | Payer: BLUE CROSS/BLUE SHIELD | Attending: Emergency Medicine | Admitting: Emergency Medicine

## 2017-09-09 DIAGNOSIS — S1980XA Other specified injuries of unspecified part of neck, initial encounter: Secondary | ICD-10-CM

## 2017-09-09 DIAGNOSIS — Y999 Unspecified external cause status: Secondary | ICD-10-CM | POA: Diagnosis not present

## 2017-09-09 DIAGNOSIS — Y939 Activity, unspecified: Secondary | ICD-10-CM | POA: Diagnosis not present

## 2017-09-09 DIAGNOSIS — I10 Essential (primary) hypertension: Secondary | ICD-10-CM | POA: Insufficient documentation

## 2017-09-09 DIAGNOSIS — Z79899 Other long term (current) drug therapy: Secondary | ICD-10-CM | POA: Insufficient documentation

## 2017-09-09 DIAGNOSIS — Z21 Asymptomatic human immunodeficiency virus [HIV] infection status: Secondary | ICD-10-CM | POA: Insufficient documentation

## 2017-09-09 DIAGNOSIS — S199XXA Unspecified injury of neck, initial encounter: Secondary | ICD-10-CM | POA: Diagnosis not present

## 2017-09-09 DIAGNOSIS — F17210 Nicotine dependence, cigarettes, uncomplicated: Secondary | ICD-10-CM | POA: Insufficient documentation

## 2017-09-09 DIAGNOSIS — Y929 Unspecified place or not applicable: Secondary | ICD-10-CM | POA: Insufficient documentation

## 2017-09-09 LAB — RAPID STREP SCREEN (MED CTR MEBANE ONLY): Streptococcus, Group A Screen (Direct): NEGATIVE
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
## 2017-09-09 NOTE — ED Triage Notes (Signed)
PT presents with c/o sore throat since last night

## 2017-09-10 MED ORDER — NAPROXEN 500 MG PO TABS: 500.0000 mg | ORAL_TABLET | Freq: Two times a day (BID) | ORAL | 0 refills | Status: DC

## 2017-09-10 MED ORDER — IBUPROFEN 400 MG PO TABS
400.0000 mg | ORAL_TABLET | Freq: Once | ORAL | Status: AC
  Administered 2017-09-10: 400 mg via ORAL
  Filled 2017-09-10: qty 1
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
## 2017-09-10 NOTE — Discharge Instructions (Signed)
Take Naproxen for pain twice a day Please return if your symptoms are worsening

## 2017-09-11 ENCOUNTER — Encounter: Payer: Self-pay | Admitting: Nurse Practitioner

## 2017-09-11 ENCOUNTER — Ambulatory Visit (INDEPENDENT_AMBULATORY_CARE_PROVIDER_SITE_OTHER): Payer: BLUE CROSS/BLUE SHIELD | Admitting: Nurse Practitioner

## 2017-09-11 VITALS — BP 170/86 | HR 94 | Temp 98.8°F | Resp 18 | Ht 72.0 in | Wt 196.4 lb

## 2017-09-11 DIAGNOSIS — E785 Hyperlipidemia, unspecified: Secondary | ICD-10-CM

## 2017-09-11 DIAGNOSIS — B20 Human immunodeficiency virus [HIV] disease: Secondary | ICD-10-CM

## 2017-09-11 DIAGNOSIS — I10 Essential (primary) hypertension: Secondary | ICD-10-CM | POA: Diagnosis not present

## 2017-09-11 DIAGNOSIS — M542 Cervicalgia: Secondary | ICD-10-CM | POA: Diagnosis not present

## 2017-09-11 DIAGNOSIS — M2550 Pain in unspecified joint: Secondary | ICD-10-CM

## 2017-09-11 LAB — URIC ACID: Uric Acid, Serum: 9.3 mg/dL — ABNORMAL HIGH (ref 4.0–8.0)

## 2017-09-11 MED ORDER — PREDNISONE 10 MG (21) PO TBPK: ORAL_TABLET | ORAL | 0 refills | Status: DC

## 2017-09-11 MED ORDER — HYDROCHLOROTHIAZIDE 50 MG PO TABS: 50.0000 mg | ORAL_TABLET | Freq: Every day | ORAL | 1 refills | Status: DC

## 2017-09-11 MED ORDER — ATORVASTATIN CALCIUM 80 MG PO TABS: 80.0000 mg | ORAL_TABLET | Freq: Every day | ORAL | 1 refills | Status: DC

## 2017-09-11 MED ORDER — LISINOPRIL 10 MG PO TABS: 10.0000 mg | ORAL_TABLET | Freq: Every day | ORAL | 0 refills | Status: DC
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
## 2017-09-11 NOTE — Patient Instructions (Addendum)
Increase lisinopril to 10 mg daily Continue to take blood pressure- if blood pressure staying over 140 for 3 days to increase lisinopril to 20 mg daily   Follow up in 1 month with FASTING BLOOD WORK prior to visit.   To start prednisone, this should help with pain, doing lab today to see if you have elevated uric acid level which would indicate gout  DASH Eating Plan DASH stands for "Dietary Approaches to Stop Hypertension." The DASH eating plan is a healthy eating plan that has been shown to reduce high blood pressure (hypertension). It may also reduce your risk for type 2 diabetes, heart disease, and stroke. The DASH eating plan may also help with weight loss. What are tips for following this plan? General guidelines  Avoid eating more than 2,300 mg (milligrams) of salt (sodium) a day. If you have hypertension, you may need to reduce your sodium intake to 1,500 mg a day.  Limit alcohol intake to no more than 1 drink a day for nonpregnant women and 2 drinks a day for men. One drink equals 12 oz of beer, 5 oz of wine, or 1 oz of hard liquor.  Work with your health care provider to maintain a healthy body weight or to lose weight. Ask what an ideal weight is for you.  Get at least 30 minutes of exercise that causes your heart to beat faster (aerobic exercise) most days of the week. Activities may include walking, swimming, or biking.  Work with your health care provider or diet and nutrition specialist (dietitian) to adjust your eating plan to your individual calorie needs. Reading food labels  Check food labels for the amount of sodium per serving. Choose foods with less than 5 percent of the Daily Value of sodium. Generally, foods with less than 300 mg of sodium per serving fit into this eating plan.  To find whole grains, look for the word "whole" as the first word in the ingredient list. Shopping  Buy products labeled as "low-sodium" or "no salt added."  Buy fresh foods. Avoid  canned foods and premade or frozen meals. Cooking  Avoid adding salt when cooking. Use salt-free seasonings or herbs instead of table salt or sea salt. Check with your health care provider or pharmacist before using salt substitutes.  Do not fry foods. Cook foods using healthy methods such as baking, boiling, grilling, and broiling instead.  Cook with heart-healthy oils, such as olive, canola, soybean, or sunflower oil. Meal planning   Eat a balanced diet that includes: ? 5 or more servings of fruits and vegetables each day. At each meal, try to fill half of your plate with fruits and vegetables. ? Up to 6-8 servings of whole grains each day. ? Less than 6 oz of lean meat, poultry, or fish each day. A 3-oz serving of meat is about the same size as a deck of cards. One egg equals 1 oz. ? 2 servings of low-fat dairy each day. ? A serving of nuts, seeds, or beans 5 times each week. ? Heart-healthy fats. Healthy fats called Omega-3 fatty acids are found in foods such as flaxseeds and coldwater fish, like sardines, salmon, and mackerel.  Limit how much you eat of the following: ? Canned or prepackaged foods. ? Food that is high in trans fat, such as fried foods. ? Food that is high in saturated fat, such as fatty meat. ? Sweets, desserts, sugary drinks, and other foods with added sugar. ? Full-fat dairy products.  Do not salt foods before eating.  Try to eat at least 2 vegetarian meals each week.  Eat more home-cooked food and less restaurant, buffet, and fast food.  When eating at a restaurant, ask that your food be prepared with less salt or no salt, if possible. What foods are recommended? The items listed may not be a complete list. Talk with your dietitian about what dietary choices are best for you. Grains Whole-grain or whole-wheat bread. Whole-grain or whole-wheat pasta. Brown rice. [place]. Bulgur. Whole-grain and low-sodium cereals. Pita bread. Low-fat, low-sodium  crackers. Whole-wheat flour tortillas. Vegetables Fresh or frozen vegetables (raw, steamed, roasted, or grilled). Low-sodium or reduced-sodium tomato and vegetable juice. Low-sodium or reduced-sodium tomato sauce and tomato paste. Low-sodium or reduced-sodium canned vegetables. Fruits All fresh, dried, or frozen fruit. Canned fruit in natural juice (without added sugar). Meat and other protein foods Skinless chicken or [place]. Ground chicken or [place]. Pork with fat trimmed off. Fish and seafood. Egg whites. Dried beans, peas, or lentils. Unsalted nuts, nut butters, and seeds. Unsalted canned beans. Lean cuts of beef with fat trimmed off. Low-sodium, lean deli meat. Dairy Low-fat (1%) or fat-free (skim) milk. Fat-free, low-fat, or reduced-fat cheeses. Nonfat, low-sodium ricotta or cottage cheese. Low-fat or nonfat yogurt. Low-fat, low-sodium cheese. Fats and oils Soft margarine without trans fats. Vegetable oil. Low-fat, reduced-fat, or light mayonnaise and salad dressings (reduced-sodium). Canola, safflower, olive, soybean, and sunflower oils. Avocado. Seasoning and other foods Herbs. Spices. Seasoning mixes without salt. Unsalted popcorn and pretzels. Fat-free sweets. What foods are not recommended? The items listed may not be a complete list. Talk with your dietitian about what dietary choices are best for you. Grains Baked goods made with fat, such as croissants, muffins, or some breads. Dry pasta or rice meal packs. Vegetables Creamed or fried vegetables. Vegetables in a cheese sauce. Regular canned vegetables (not low-sodium or reduced-sodium). Regular canned tomato sauce and paste (not low-sodium or reduced-sodium). Regular tomato and vegetable juice (not low-sodium or reduced-sodium). [name]. Olives. Fruits Canned fruit in a light or heavy syrup. Fried fruit. Fruit in cream or butter sauce. Meat and other protein foods Fatty cuts of meat. Ribs. Fried meat. [name]. Sausage. Bologna and  other processed lunch meats. Salami. Fatback. Hotdogs. Bratwurst. Salted nuts and seeds. Canned beans with added salt. Canned or smoked fish. Whole eggs or egg yolks. Chicken or [place] with skin. Dairy Whole or 2% milk, cream, and half-and-half. Whole or full-fat cream cheese. Whole-fat or sweetened yogurt. Full-fat cheese. Nondairy creamers. Whipped toppings. Processed cheese and cheese spreads. Fats and oils Butter. Stick margarine. Lard. Shortening. Ghee. Bacon fat. Tropical oils, such as coconut, palm kernel, or palm oil. Seasoning and other foods Salted popcorn and pretzels. Onion salt, garlic salt, seasoned salt, table salt, and sea salt. Worcestershire sauce. Tartar sauce. Barbecue sauce. Teriyaki sauce. Soy sauce, including reduced-sodium. Steak sauce. Canned and packaged gravies. Fish sauce. Oyster sauce. Cocktail sauce. Horseradish that you find on the shelf. Ketchup. Mustard. Meat flavorings and tenderizers. Bouillon cubes. Hot sauce and Tabasco sauce. Premade or packaged marinades. Premade or packaged taco seasonings. Relishes. Regular salad dressings. Where to find more information:  National Heart, Lung, and [place]: https://wilson-eaton.com/  American Heart Association: www.heart.org Summary  The DASH eating plan is a healthy eating plan that has been shown to reduce high blood pressure (hypertension). It may also reduce your risk for type 2 diabetes, heart disease, and stroke.  With the DASH eating plan, you should limit salt (sodium)  intake to 2,300 mg a day. If you have hypertension, you may need to reduce your sodium intake to 1,500 mg a day.  When on the DASH eating plan, aim to eat more fresh fruits and vegetables, whole grains, lean proteins, low-fat dairy, and heart-healthy fats.  Work with your health care provider or diet and nutrition specialist (dietitian) to adjust your eating plan to your individual calorie needs. This information is not intended to replace advice  given to you by your health care provider. Make sure you discuss any questions you have with your health care provider. Document Released: 09/12/2011 Document Revised: 09/16/2016 Document Reviewed: 09/16/2016 Elsevier Interactive Patient Education  2017 [employer].

## 2017-09-12 ENCOUNTER — Other Ambulatory Visit: Payer: Self-pay | Admitting: Nurse Practitioner

## 2017-09-12 ENCOUNTER — Other Ambulatory Visit: Payer: Self-pay

## 2017-09-12 LAB — CULTURE, GROUP A STREP (THRC)

## 2017-09-12 MED ORDER — ALLOPURINOL 100 MG PO TABS: 100.0000 mg | ORAL_TABLET | Freq: Every day | ORAL | 1 refills | Status: DC

## 2017-09-12 NOTE — Telephone Encounter (Signed)
Prescription sent to pharmacy based on recommendations of provider based on lab results.  Rx sent electronically.

## 2017-10-20 ENCOUNTER — Telehealth: Payer: Self-pay | Admitting: *Deleted

## 2017-10-20 NOTE — Telephone Encounter (Signed)
Called patient to let him know his medication had arrived.  Danae Chenodefsey #[id number], ndc [id number], lot [id number], exp 09/2018

## 2017-12-03 ENCOUNTER — Other Ambulatory Visit: Payer: Self-pay | Admitting: Nurse Practitioner

## 2017-12-03 DIAGNOSIS — I10 Essential (primary) hypertension: Secondary | ICD-10-CM

## 2017-12-04 DIAGNOSIS — F17210 Nicotine dependence, cigarettes, uncomplicated: Secondary | ICD-10-CM | POA: Diagnosis not present

## 2017-12-04 DIAGNOSIS — R0981 Nasal congestion: Secondary | ICD-10-CM | POA: Diagnosis not present

## 2017-12-04 DIAGNOSIS — J069 Acute upper respiratory infection, unspecified: Secondary | ICD-10-CM | POA: Diagnosis not present

## 2017-12-04 DIAGNOSIS — I10 Essential (primary) hypertension: Secondary | ICD-10-CM | POA: Diagnosis not present

## 2017-12-04 DIAGNOSIS — R05 Cough: Secondary | ICD-10-CM | POA: Diagnosis not present

## 2017-12-30 ENCOUNTER — Other Ambulatory Visit: Payer: Self-pay | Admitting: Nurse Practitioner

## 2017-12-30 DIAGNOSIS — I10 Essential (primary) hypertension: Secondary | ICD-10-CM

## 2018-01-30 ENCOUNTER — Other Ambulatory Visit: Payer: Self-pay | Admitting: Nurse Practitioner

## 2018-01-30 DIAGNOSIS — I10 Essential (primary) hypertension: Secondary | ICD-10-CM

## 2018-01-30 MED ORDER — HYDROCHLOROTHIAZIDE 50 MG PO TABS: 50.0000 mg | ORAL_TABLET | Freq: Every day | ORAL | 0 refills | Status: DC

## 2018-01-30 NOTE — Telephone Encounter (Signed)
Spoke with patient, patient informed he is overdue for an appointment.  Scheduled appointment for Monday at 10:45.  Last OV 09/2017, patient was told to schedule a 1 month follow-up, appointment was never scheduled   RX sent for #30 with the indication must keep pending appointment

## 2018-02-02 ENCOUNTER — Ambulatory Visit: Payer: BLUE CROSS/BLUE SHIELD | Admitting: Nurse Practitioner

## 2018-02-10 ENCOUNTER — Telehealth: Payer: Self-pay | Admitting: *Deleted

## 2018-02-10 NOTE — Telephone Encounter (Signed)
Called the patient to inform him that his medication is ready to be picked up. He advised he will be by soon to get it.

## 2018-02-13 ENCOUNTER — Other Ambulatory Visit: Payer: Self-pay | Admitting: *Deleted

## 2018-02-13 ENCOUNTER — Encounter (HOSPITAL_BASED_OUTPATIENT_CLINIC_OR_DEPARTMENT_OTHER): Payer: Self-pay | Admitting: *Deleted

## 2018-02-13 ENCOUNTER — Emergency Department (HOSPITAL_BASED_OUTPATIENT_CLINIC_OR_DEPARTMENT_OTHER)
Admission: EM | Admit: 2018-02-13 | Discharge: 2018-02-13 | Disposition: A | Discharge status: Home / Self Care | Payer: BLUE CROSS/BLUE SHIELD | Attending: Emergency Medicine | Admitting: Emergency Medicine

## 2018-02-13 ENCOUNTER — Other Ambulatory Visit: Payer: Self-pay

## 2018-02-13 DIAGNOSIS — M10031 Idiopathic gout, right wrist: Secondary | ICD-10-CM | POA: Diagnosis not present

## 2018-02-13 DIAGNOSIS — I10 Essential (primary) hypertension: Secondary | ICD-10-CM | POA: Insufficient documentation

## 2018-02-13 DIAGNOSIS — Z21 Asymptomatic human immunodeficiency virus [HIV] infection status: Secondary | ICD-10-CM | POA: Insufficient documentation

## 2018-02-13 DIAGNOSIS — F1010 Alcohol abuse, uncomplicated: Secondary | ICD-10-CM | POA: Diagnosis not present

## 2018-02-13 DIAGNOSIS — M25531 Pain in right wrist: Secondary | ICD-10-CM | POA: Diagnosis not present

## 2018-02-13 DIAGNOSIS — F17210 Nicotine dependence, cigarettes, uncomplicated: Secondary | ICD-10-CM | POA: Diagnosis not present

## 2018-02-13 DIAGNOSIS — Z79899 Other long term (current) drug therapy: Secondary | ICD-10-CM | POA: Insufficient documentation

## 2018-02-13 DIAGNOSIS — Z7982 Long term (current) use of aspirin: Secondary | ICD-10-CM | POA: Insufficient documentation

## 2018-02-13 MED ORDER — COLCHICINE 0.6 MG PO TABS
1.2000 mg | ORAL_TABLET | Freq: Once | ORAL | Status: AC
  Administered 2018-02-13: 1.2 mg via ORAL
  Filled 2018-02-13: qty 2

## 2018-02-13 MED ORDER — PREDNISONE 50 MG PO TABS: 60.0000 mg | ORAL_TABLET | Freq: Once | ORAL | Status: DC

## 2018-02-13 MED ORDER — KETOROLAC TROMETHAMINE 15 MG/ML IJ SOLN
15.0000 mg | Freq: Once | INTRAMUSCULAR | Status: AC
  Administered 2018-02-13: 15 mg via INTRAMUSCULAR
  Filled 2018-02-13: qty 1

## 2018-02-13 MED ORDER — COLCHICINE 0.6 MG PO TABS: ORAL_TABLET | ORAL | 0 refills | Status: DC

## 2018-02-13 MED ORDER — ALLOPURINOL 100 MG PO TABS: 100.0000 mg | ORAL_TABLET | Freq: Every day | ORAL | 1 refills | Status: DC

## 2018-02-13 MED ORDER — DEXAMETHASONE 6 MG PO TABS
10.0000 mg | ORAL_TABLET | Freq: Once | ORAL | Status: AC
  Administered 2018-02-13: 10 mg via ORAL
  Filled 2018-02-13: qty 1

## 2018-02-13 MED ORDER — INDOMETHACIN 50 MG PO CAPS: 50.0000 mg | ORAL_CAPSULE | Freq: Three times a day (TID) | ORAL | 0 refills | Status: DC

## 2018-02-13 NOTE — ED Provider Notes (Signed)
[facility] EMERGENCY DEPARTMENT Provider Note   CSN: [id number] Arrival date & time: 02/13/18  1511     History   Chief Complaint Chief Complaint  Patient presents with  . Wrist Pain    HPI Brandon Quinn is a 52 y.o. male.  52 yo M with a history of gout comes in with a chief complaint of right wrist pain.  This feels like his gout and he had gout in this location before.  He denies trauma denies fevers chills.  He has tried ibuprofen and naproxen without improvement.  He had difficulty sleeping with it.  Pain with movement and palpation.  He is requesting something else for the pain.  The history is provided by the patient.  Wrist Pain  This is a recurrent problem. The current episode started more than 2 days ago. The problem occurs constantly. The problem has not changed since onset.Pertinent negatives include no chest pain, no abdominal pain, no headaches and no shortness of breath. The symptoms are aggravated by bending and twisting. Nothing relieves the symptoms. Treatments tried: nsaids. The treatment provided no relief.    Past Medical History:  Diagnosis Date  . Chest pain    a. 07/2012 Lexiscan Sestamibi:  no ischemia/infarct, nl EF.  Marland Kitchen HIV infection (HCC)   . Hyperlipidemia   . Hypertension   . Substance abuse (HCC)    alcohol   . Tobacco abuse     Patient Active Problem List   Diagnosis Date Noted  . Hypertensive emergency 06/02/2017  . Alcohol abuse 06/01/2017  . Pain, dental 07/29/2016  . Cough 07/29/2016  . Rash 06/23/2015  . HIV disease (HCC) 05/18/2015  . Acute gouty arthritis 01/30/2015  . Oral thrush 01/30/2015  . Tobacco abuse 07/30/2012  . Hypertension 07/29/2012    Past Surgical History:  Procedure Laterality Date  . WRIST FRACTURE SURGERY  ~ 2009   right; "put a plate in it" (40/98/1191)        Home Medications    Prior to Admission medications   Medication Sig Start Date End Date Taking? Authorizing Provider    allopurinol (ZYLOPRIM) 100 MG tablet Take 1 tablet (100 mg total) by mouth daily. 02/13/18   Sharon Seller, NP  aspirin EC 81 MG tablet Take 81 mg by mouth daily.    [provider]  atorvastatin (LIPITOR) 80 MG tablet Take 1 tablet (80 mg total) by mouth daily. 09/11/17   Sharon Seller, NP  colchicine 0.6 MG tablet Take this one pill 1 hour after your first dose given in the ED 02/13/18   Melene Plan, DO  emtricitabine-rilpivir-tenofovir AF (ODEFSEY) 200-25-25 MG TABS tablet Take 1 tablet by mouth daily. 07/14/17   Cliffton Asters, MD  hydrochlorothiazide (HYDRODIURIL) 50 MG tablet Take 1 tablet (50 mg total) by mouth daily. MUST KEEP PENDING APPOINTMENT 01/30/18   Sharon Seller, NP  indomethacin (INDOCIN) 50 MG capsule Take 1 capsule (50 mg total) by mouth 3 (three) times daily with meals. 02/13/18   Melene Plan, DO  lisinopril (PRINIVIL,ZESTRIL) 10 MG tablet Take 1 tablet (10 mg total) by mouth daily. MUST KEEP PENDING APPOINTMENT 01/30/18   Sharon Seller, NP  predniSONE (STERAPRED UNI-PAK 21 TAB) 10 MG (21) TBPK tablet Use as directed 09/11/17   Sharon Seller, NP    Family History Family History  Problem Relation Age of Onset  . Other Unknown        No known heart disease    Social  History Social History   Tobacco Use  . Smoking status: Current Every Day Smoker    Packs/day: 1.00    Years: 26.00    Pack years: 26.00    Types: Cigarettes  . Smokeless tobacco: Never Used  . Tobacco comment: states he is trying to cut back  Substance Use Topics  . Alcohol use: Yes    Alcohol/week: 3.0 oz    Types: 5 Cans of beer per week    Comment: 2 to 3 22oz beers per day; no liquor  . Drug use: No     Allergies   Genvoya [elviteg-cobic-emtricit-tenofaf]   Review of Systems Review of Systems  Constitutional: Negative for chills and fever.  HENT: Negative for congestion and facial swelling.   Eyes: Negative for discharge and visual disturbance.  Respiratory:  Negative for shortness of breath.   Cardiovascular: Negative for chest pain and palpitations.  Gastrointestinal: Negative for abdominal pain, diarrhea and vomiting.  Musculoskeletal: Positive for arthralgias and myalgias.  Skin: Negative for color change and rash.  Neurological: Negative for tremors, syncope and headaches.  Psychiatric/Behavioral: Negative for confusion and dysphoric mood.     Physical Exam Updated Vital Signs BP 136/68 (BP Location: Left Arm)   Pulse 82   Temp 98.4 F (36.9 C) (Oral)   Resp 18   Ht 6' (1.829 m)   Wt 90.7 kg (200 lb)   SpO2 98%   BMI 27.12 kg/m   Physical Exam  Constitutional: He is oriented to person, place, and time. He appears well-developed and well-nourished.  HENT:  Head: Normocephalic and atraumatic.  Eyes: Pupils are equal, round, and reactive to light. EOM are normal.  Neck: Normal range of motion. Neck supple. No JVD present.  Cardiovascular: Normal rate and regular rhythm. Exam reveals no gallop and no friction rub.  No murmur heard. Pulmonary/Chest: No respiratory distress. He has no wheezes.  Abdominal: He exhibits no distension. There is no rebound and no guarding.  Musculoskeletal: Normal range of motion. He exhibits edema and tenderness.  Pain and swelling diffusely about the right wrist.  Intact pulse motor and sensation distally.  Neurological: He is alert and oriented to person, place, and time.  Skin: No rash noted. No pallor.  Psychiatric: He has a normal mood and affect. His behavior is normal.  Nursing note and vitals reviewed.    ED Treatments / Results  Labs (all labs ordered are listed, but only abnormal results are displayed) Labs Reviewed - No data to display  EKG None  Radiology No results found.  Procedures Procedures (including critical care time)  Medications Ordered in ED Medications  colchicine tablet 1.2 mg (1.2 mg Oral Given 02/13/18 1644)  ketorolac (TORADOL) 15 MG/ML injection 15 mg (15  mg Intramuscular Given 02/13/18 1642)  dexamethasone (DECADRON) tablet 10 mg (10 mg Oral Given 02/13/18 1644)     Initial Impression / Assessment and Plan / ED Course  I have reviewed the triage vital signs and the nursing notes.  Pertinent labs & imaging results that were available during my care of the patient were reviewed by me and considered in my medical decision making (see chart for details).     52 yo M with a chief complaint of right wrist pain.  Patient has a history of gout and has used the same.  He is also had at this joint before.  Will treat as gout.  PCP follow up.   4:46 PM:  I have discussed the diagnosis/risks/treatment options with  the patient and believe the pt to be eligible for discharge home to follow-up with PCP. We also discussed returning to the ED immediately if new or worsening sx occur. We discussed the sx which are most concerning (e.g., sudden worsening pain, fever, inability to tolerate by mouth) that necessitate immediate return. Medications administered to the patient during their visit and any new prescriptions provided to the patient are listed below.  Medications given during this visit Medications  colchicine tablet 1.2 mg (1.2 mg Oral Given 02/13/18 1644)  ketorolac (TORADOL) 15 MG/ML injection 15 mg (15 mg Intramuscular Given 02/13/18 1642)  dexamethasone (DECADRON) tablet 10 mg (10 mg Oral Given 02/13/18 1644)    The patient appears reasonably screen and/or stabilized for discharge and I doubt any other medical condition or other [facility] requiring further screening, evaluation, or treatment in the ED at this time prior to discharge.    Final Clinical Impressions(s) / ED Diagnoses   Final diagnoses:  Acute idiopathic gout of right wrist    ED Discharge Orders        Ordered    indomethacin (INDOCIN) 50 MG capsule  3 times daily with meals     02/13/18 1615    colchicine 0.6 MG tablet     02/13/18 1615       Melene Plan, DO 02/13/18 1646

## 2018-02-13 NOTE — Telephone Encounter (Signed)
Patient called requesting refill. Faxed to pharmacy. Confirmed upcoming appointment.

## 2018-02-13 NOTE — Discharge Instructions (Signed)
Please see your PCP in a couple days for this so they can recheck your wrist and may need to check your renal function with the medications that you are on.

## 2018-02-13 NOTE — ED Triage Notes (Signed)
Pt c/o right wrist pain HX gout

## 2018-02-19 ENCOUNTER — Encounter: Payer: Self-pay | Admitting: Nurse Practitioner

## 2018-02-19 ENCOUNTER — Ambulatory Visit (INDEPENDENT_AMBULATORY_CARE_PROVIDER_SITE_OTHER): Payer: BLUE CROSS/BLUE SHIELD | Admitting: Nurse Practitioner

## 2018-02-19 VITALS — BP 152/78 | HR 76 | Temp 98.1°F | Ht 72.0 in | Wt 203.0 lb

## 2018-02-19 DIAGNOSIS — B20 Human immunodeficiency virus [HIV] disease: Secondary | ICD-10-CM

## 2018-02-19 DIAGNOSIS — I10 Essential (primary) hypertension: Secondary | ICD-10-CM | POA: Diagnosis not present

## 2018-02-19 DIAGNOSIS — Z1211 Encounter for screening for malignant neoplasm of colon: Secondary | ICD-10-CM | POA: Diagnosis not present

## 2018-02-19 DIAGNOSIS — F1010 Alcohol abuse, uncomplicated: Secondary | ICD-10-CM

## 2018-02-19 DIAGNOSIS — Z1212 Encounter for screening for malignant neoplasm of rectum: Secondary | ICD-10-CM

## 2018-02-19 DIAGNOSIS — E785 Hyperlipidemia, unspecified: Secondary | ICD-10-CM | POA: Diagnosis not present

## 2018-02-19 DIAGNOSIS — F17200 Nicotine dependence, unspecified, uncomplicated: Secondary | ICD-10-CM | POA: Diagnosis not present

## 2018-02-19 DIAGNOSIS — M1A09X0 Idiopathic chronic gout, multiple sites, without tophus (tophi): Secondary | ICD-10-CM

## 2018-02-19 NOTE — Patient Instructions (Addendum)
To make appt for fasting lab work next week, will send in refills after we get blood work back. May need to adjust medication based on blood work.   To follow up in 4 weeks on blood pressure with Dr Montez Morita (physican visit)   To make follow up with Infectious Disease (Dr Orvan Falconer)  [facility] for Infectious Disease Infectious disease physician in [place], [place] [address], [place], [place] [address] Phone: [phone]  To let us know about referral to gastroenterology for colorectal screening.     DASH Eating Plan DASH stands for "Dietary Approaches to Stop Hypertension." The DASH eating plan is a healthy eating plan that has been shown to reduce high blood pressure (hypertension). It may also reduce your risk for type 2 diabetes, heart disease, and stroke. The DASH eating plan may also help with weight loss. What are tips for following this plan? General guidelines  Avoid eating more than 2,300 mg (milligrams) of salt (sodium) a day. If you have hypertension, you may need to reduce your sodium intake to 1,500 mg a day.  Limit alcohol intake to no more than 1 drink a day for nonpregnant women and 2 drinks a day for men. One drink equals 12 oz of beer, 5 oz of wine, or 1 oz of hard liquor.  Work with your health care provider to maintain a healthy body weight or to lose weight. Ask what an ideal weight is for you.  Get at least 30 minutes of exercise that causes your heart to beat faster (aerobic exercise) most days of the week. Activities may include walking, swimming, or biking.  Work with your health care provider or diet and nutrition specialist (dietitian) to adjust your eating plan to your individual calorie needs. Reading food labels  Check food labels for the amount of sodium per serving. Choose foods with less than 5 percent of the Daily Value of sodium. Generally, foods with less than 300 mg of sodium per serving fit into this eating plan.  To find  whole grains, look for the word "whole" as the first word in the ingredient list. Shopping  Buy products labeled as "low-sodium" or "no salt added."  Buy fresh foods. Avoid canned foods and premade or frozen meals. Cooking  Avoid adding salt when cooking. Use salt-free seasonings or herbs instead of table salt or sea salt. Check with your health care provider or pharmacist before using salt substitutes.  Do not fry foods. Cook foods using healthy methods such as baking, boiling, grilling, and broiling instead.  Cook with heart-healthy oils, such as olive, canola, soybean, or sunflower oil. Meal planning   Eat a balanced diet that includes: ? 5 or more servings of fruits and vegetables each day. At each meal, try to fill half of your plate with fruits and vegetables. ? Up to 6-8 servings of whole grains each day. ? Less than 6 oz of lean meat, poultry, or fish each day. A 3-oz serving of meat is about the same size as a deck of cards. One egg equals 1 oz. ? 2 servings of low-fat dairy each day. ? A serving of nuts, seeds, or beans 5 times each week. ? Heart-healthy fats. Healthy fats called Omega-3 fatty acids are found in foods such as flaxseeds and coldwater fish, like sardines, salmon, and mackerel.  Limit how much you eat of the following: ? Canned or prepackaged foods. ? Food that is high in trans fat, such as fried foods. ?  Food that is high in saturated fat, such as fatty meat. ? Sweets, desserts, sugary drinks, and other foods with added sugar. ? Full-fat dairy products.  Do not salt foods before eating.  Try to eat at least 2 vegetarian meals each week.  Eat more home-cooked food and less restaurant, buffet, and fast food.  When eating at a restaurant, ask that your food be prepared with less salt or no salt, if possible. What foods are recommended? The items listed may not be a complete list. Talk with your dietitian about what dietary choices are best for  you. Grains Whole-grain or whole-wheat bread. Whole-grain or whole-wheat pasta. Brown rice. [place]row. Bulgur. Whole-grain and low-sodium cereals. Pita bread. Low-fat, low-sodium crackers. Whole-wheat flour tortillas. Vegetables Fresh or frozen vegetables (raw, steamed, roasted, or grilled). Low-sodium or reduced-sodium tomato and vegetable juice. Low-sodium or reduced-sodium tomato sauce and tomato paste. Low-sodium or reduced-sodium canned vegetables. Fruits All fresh, dried, or frozen fruit. Canned fruit in natural juice (without added sugar). Meat and other protein foods Skinless chicken or [place]. Ground chicken or [place]. Pork with fat trimmed off. Fish and seafood. Egg whites. Dried beans, peas, or lentils. Unsalted nuts, nut butters, and seeds. Unsalted canned beans. Lean cuts of beef with fat trimmed off. Low-sodium, lean deli meat. Dairy Low-fat (1%) or fat-free (skim) milk. Fat-free, low-fat, or reduced-fat cheeses. Nonfat, low-sodium ricotta or cottage cheese. Low-fat or nonfat yogurt. Low-fat, low-sodium cheese. Fats and oils Soft margarine without trans fats. Vegetable oil. Low-fat, reduced-fat, or light mayonnaise and salad dressings (reduced-sodium). Canola, safflower, olive, soybean, and sunflower oils. Avocado. Seasoning and other foods Herbs. Spices. Seasoning mixes without salt. Unsalted popcorn and pretzels. Fat-free sweets. What foods are not recommended? The items listed may not be a complete list. Talk with your dietitian about what dietary choices are best for you. Grains Baked goods made with fat, such as croissants, muffins, or some breads. Dry pasta or rice meal packs. Vegetables Creamed or fried vegetables. Vegetables in a cheese sauce. Regular canned vegetables (not low-sodium or reduced-sodium). Regular canned tomato sauce and paste (not low-sodium or reduced-sodium). Regular tomato and vegetable juice (not low-sodium or reduced-sodium). [name].  Olives. Fruits Canned fruit in a light or heavy syrup. Fried fruit. Fruit in cream or butter sauce. Meat and other protein foods Fatty cuts of meat. Ribs. Fried meat. [name]. Sausage. Bologna and other processed lunch meats. Salami. Fatback. Hotdogs. Bratwurst. Salted nuts and seeds. Canned beans with added salt. Canned or smoked fish. Whole eggs or egg yolks. Chicken or [place] with skin. Dairy Whole or 2% milk, cream, and half-and-half. Whole or full-fat cream cheese. Whole-fat or sweetened yogurt. Full-fat cheese. Nondairy creamers. Whipped toppings. Processed cheese and cheese spreads. Fats and oils Butter. Stick margarine. Lard. Shortening. Ghee. Bacon fat. Tropical oils, such as coconut, palm kernel, or palm oil. Seasoning and other foods Salted popcorn and pretzels. Onion salt, garlic salt, seasoned salt, table salt, and sea salt. Worcestershire sauce. Tartar sauce. Barbecue sauce. Teriyaki sauce. Soy sauce, including reduced-sodium. Steak sauce. Canned and packaged gravies. Fish sauce. Oyster sauce. Cocktail sauce. Horseradish that you find on the shelf. Ketchup. Mustard. Meat flavorings and tenderizers. Bouillon cubes. Hot sauce and Tabasco sauce. Premade or packaged marinades. Premade or packaged taco seasonings. Relishes. Regular salad dressings. Where to find more information:  National Heart, Lung, and [place]: https://wilson-eaton.com/  American Heart Association: www.heart.org Summary  The DASH eating plan is a healthy eating plan that has been shown to reduce high blood  pressure (hypertension). It may also reduce your risk for type 2 diabetes, heart disease, and stroke.  With the DASH eating plan, you should limit salt (sodium) intake to 2,300 mg a day. If you have hypertension, you may need to reduce your sodium intake to 1,500 mg a day.  When on the DASH eating plan, aim to eat more fresh fruits and vegetables, whole grains, lean proteins, low-fat dairy, and heart-healthy  fats.  Work with your health care provider or diet and nutrition specialist (dietitian) to adjust your eating plan to your individual calorie needs. This information is not intended to replace advice given to you by your health care provider. Make sure you discuss any questions you have with your health care provider. Document Released: 09/12/2011 Document Revised: 09/16/2016 Document Reviewed: 09/16/2016 Elsevier Interactive Patient Education  [employer].

## 2018-02-19 NOTE — Progress Notes (Signed)
Careteam: Patient Care Team: Sharon Seller, NP as PCP - General (Geriatric Medicine) Cliffton Asters, MD as Consulting Physician (Infectious Diseases)  Advanced Directive information Does Patient Have a Medical Advance Directive?: No, Would patient like information on creating a medical advance directive?: No - Patient declined  Allergies  Allergen Reactions  . Genvoya [Elviteg-Cobic-Emtricit-Tenofaf] Rash    Chief Complaint  Patient presents with  . Medical Management of Chronic Issues    4-5 mo f/u on hypertension, discuss medication dose and frequency(Lisinopril)  . Advanced Care Planning    Not interested  . Medication Refill    90 day supply on all medication      HPI: Patient is a 52 y.o. male seen in the office today for follow up on blood pressure. Pt missed physical appt after originally establishing care  Reports blood pressure is better since he has been taking his medication.   Gout- allopurinol 100 mg by mouth daily, no longer taking colchicine   Hyperlipidemia- not taking Lipitor 80 mg daily  Reports he is not seeing ID routinely, last OV in October. Was unaware he was supposed to follow up.   Hypertension- taking lisinopril 10 mg daily and HCTZ 50 mg daily. Blood pressure has improved but not at goal.  Reports he checking blood pressure outside of office 136-150s/70-80 Reports he does not eat salt.    Drank beer this morning, has already drank 1 22 oz beer today. Drinks about 3 times a week.  Smoking less than a pack a day now- has smoked ~ 1ppd for around 3o days   Review of Systems:  Review of Systems  Constitutional: Negative for chills, fever and weight loss.  Respiratory: Negative for cough, sputum production and shortness of breath.   Cardiovascular: Negative for chest pain, palpitations and leg swelling.  Gastrointestinal: Negative for abdominal pain, constipation, diarrhea and heartburn.  Genitourinary: Negative for dysuria, frequency  and urgency.  Musculoskeletal: Positive for joint pain (in wrist and hands ) and myalgias. Negative for back pain.  Skin: Negative.   Neurological: Negative for dizziness and headaches.  Psychiatric/Behavioral: Negative for depression. The patient does not have insomnia.     Past Medical History:  Diagnosis Date  . Chest pain    a. 07/2012 Lexiscan Sestamibi:  no ischemia/infarct, nl EF.  Marland Kitchen HIV infection (HCC)   . Hyperlipidemia   . Hypertension   . Substance abuse (HCC)    alcohol   . Tobacco abuse    Past Surgical History:  Procedure Laterality Date  . WRIST FRACTURE SURGERY  ~ 2009   right; "put a plate in it" (95/62/1308)   Social History:   reports that he has been smoking cigarettes.  He has a 26.00 pack-year smoking history. He has never used smokeless tobacco. He reports that he drinks about 3.0 oz of alcohol per week. He reports that he does not use drugs.  Family History  Problem Relation Age of Onset  . Other Unknown        No known heart disease    Medications: Patient's Medications  New Prescriptions   No medications on file  Previous Medications   ALLOPURINOL (ZYLOPRIM) 100 MG TABLET    Take 1 tablet (100 mg total) by mouth daily.   ASPIRIN EC 81 MG TABLET    Take 81 mg by mouth daily.   ATORVASTATIN (LIPITOR) 80 MG TABLET    Take 1 tablet (80 mg total) by mouth daily.   COLCHICINE 0.6 MG  TABLET    Take this one pill 1 hour after your first dose given in the ED   EMTRICITABINE-RILPIVIR-TENOFOVIR AF (ODEFSEY) 200-25-25 MG TABS TABLET    Take 1 tablet by mouth daily.   HYDROCHLOROTHIAZIDE (HYDRODIURIL) 50 MG TABLET    Take 1 tablet (50 mg total) by mouth daily. MUST KEEP PENDING APPOINTMENT   LISINOPRIL (PRINIVIL,ZESTRIL) 10 MG TABLET    Take 1 tablet (10 mg total) by mouth daily. MUST KEEP PENDING APPOINTMENT  Modified Medications   No medications on file  Discontinued Medications   INDOMETHACIN (INDOCIN) 50 MG CAPSULE    Take 1 capsule (50 mg total) by  mouth 3 (three) times daily with meals.   PREDNISONE (STERAPRED UNI-PAK 21 TAB) 10 MG (21) TBPK TABLET    Use as directed     Physical Exam:  Vitals:   02/19/18 0938  BP: (!) 152/78  Pulse: 76  Temp: 98.1 F (36.7 C)  SpO2: 95%  Weight: 203 lb (92.1 kg)  Height: 6' (1.829 m)   Body mass index is 27.53 kg/m.  Physical Exam  Constitutional: He is oriented to person, place, and time. He appears well-developed and well-nourished. No distress.  HENT:  Head: Normocephalic and atraumatic.  Mouth/Throat: Oropharynx is clear and moist. No oropharyngeal exudate.  Eyes: Pupils are equal, round, and reactive to light. Conjunctivae and EOM are normal.  Neck: Normal range of motion. Neck supple.  Cardiovascular: Normal rate, regular rhythm and normal heart sounds.  Pulmonary/Chest: Effort normal and breath sounds normal.  Abdominal: Soft. Bowel sounds are normal.  Musculoskeletal: He exhibits no edema or tenderness.  Neurological: He is alert and oriented to person, place, and time.  Skin: Skin is warm and dry. He is not diaphoretic.  Psychiatric: He has a normal mood and affect.    Labs reviewed: Basic Metabolic Panel: Recent Labs    04/08/17 1751 06/01/17 1140 06/02/17 0452  NA 141 138 138  K 4.1 3.7 3.6  CL 108 107 107  CO2 GLUCOSE 108* 82 105*  BUN 9 5* 9  CREATININE 1.06 0.88 0.96  CALCIUM 9.0 8.6* 8.5*   Liver Function Tests: Recent Labs    04/08/17 1751 06/01/17 1140 06/02/17 0452  AST ALT 11 13* 11*  ALKPHOS 89 76 64  BILITOT 0.4 0.5 0.8  PROT 7.6 7.8 6.8  ALBUMIN 3.8 3.4* 2.9*   Recent Labs    06/01/17 1140  LIPASE 40   No results for input(s): AMMONIA in the last 8760 hours. CBC: Recent Labs    04/08/17 1751 06/01/17 1140 06/02/17 0452  WBC 5.8 7.8 6.1  NEUTROABS 1,740  --   --   HGB 13.8 14.9 14.0  HCT 41.1 44.5 41.8  MCV 90.5 90.1 90.5  PLT 208 207 187   Lipid Panel: Recent Labs    04/08/17 1751  CHOL 245*  HDL  49  LDLCALC 168*  TRIG 138  CHOLHDL 5.0*   TSH: No results for input(s): TSH in the last 8760 hours. A1C: Lab Results  Component Value Date   HGBA1C 5.7 (H) 07/29/2012     Assessment/Plan 1. Chronic gout of multiple sites, unspecified cause -continues with pain in wrist. Went to ED on 02/13/18, reports he is only taking allopurinol and does not wish to take indomethacin or colchicine because that is "too many medication" discuss proper treatment of gout flare vs maintenance but he would like to just take allopurinol at this time.  Also discussed dietary modifications to help with uric acid and reducing/stoping beer.  2. Screening for colorectal cancer -pt was educated on colonoscopy, reports he does not have family hx of cancer so he is not sure he will get this done. Explained to him the risk of colon cancer despite no family hx and that this can also be without symptoms. Pt also smoker with ETOH abuse.   3. Essential hypertension -improved but not at goal. Will  - CBC with Differential/Platelets; Future  4. HIV disease (HCC) Continues on odefsey, over due to ID appt. Encouraged to make follow up   5. Hyperlipidemia, unspecified hyperlipidemia type -LDL elevated last year but not taking atorvastin, will get lab work prior to sending in refill  - Lipid Panel; Future - CMP; Future  6. ETOH abuse Unable to get fasting blood work today because he has already had 22 oz beer. He does not feel like he has a drinking problem. Encouraged him to limit ETOH intake due to gout and other co-morbidites.   7. Smoker Cessation encouraged.   Next appt: 02/26/2018 Janene Harvey. Biagio Borg  [facility] [phone]

## 2018-02-25 ENCOUNTER — Other Ambulatory Visit: Payer: Self-pay | Admitting: Nurse Practitioner

## 2018-02-25 DIAGNOSIS — I10 Essential (primary) hypertension: Secondary | ICD-10-CM

## 2018-02-26 ENCOUNTER — Other Ambulatory Visit: Payer: BLUE CROSS/BLUE SHIELD

## 2018-02-26 ENCOUNTER — Other Ambulatory Visit: Payer: Self-pay | Admitting: *Deleted

## 2018-02-26 DIAGNOSIS — I10 Essential (primary) hypertension: Secondary | ICD-10-CM

## 2018-02-26 MED ORDER — HYDROCHLOROTHIAZIDE 50 MG PO TABS: 50.0000 mg | ORAL_TABLET | Freq: Every day | ORAL | 1 refills | Status: DC

## 2018-02-27 ENCOUNTER — Other Ambulatory Visit: Payer: BLUE CROSS/BLUE SHIELD

## 2018-02-27 DIAGNOSIS — E785 Hyperlipidemia, unspecified: Secondary | ICD-10-CM | POA: Diagnosis not present

## 2018-02-27 DIAGNOSIS — R739 Hyperglycemia, unspecified: Secondary | ICD-10-CM | POA: Diagnosis not present

## 2018-02-27 DIAGNOSIS — I10 Essential (primary) hypertension: Secondary | ICD-10-CM | POA: Diagnosis not present

## 2018-03-03 ENCOUNTER — Telehealth: Payer: Self-pay

## 2018-03-03 DIAGNOSIS — E785 Hyperlipidemia, unspecified: Secondary | ICD-10-CM

## 2018-03-03 MED ORDER — ATORVASTATIN CALCIUM 80 MG PO TABS: ORAL_TABLET | ORAL | 6 refills | Status: DC

## 2018-03-03 NOTE — Telephone Encounter (Signed)
Discussed results with patient, patient verbalized understanding of results  RX sent to pharmacy on file, [facility]  Copy of labs mailed to patient

## 2018-03-03 NOTE — Telephone Encounter (Signed)
-----   Message from Sharon Seller, NP sent at 03/03/2018 12:55 PM EDT ----- Cholesterol is not controlled. LDL and triglycerides are elevated. Pt reported he was not taking lipitor. To restart lipitor 80 mg 1/2 tablet for 1 week then increase to whole tablet. To start heart healthy diet, avoid sweets and to control the amount of carbohydrates you eat (starchy foods such as flour, bread, and potatoes), avoid high fat dairy (like ice cream, whole milk, cheeses), can buy a heart healthy cook book from the [employer] to help with meal planning.  Glucose also elevated,  Would like to add on A1c if possible.

## 2018-03-04 LAB — COMPREHENSIVE METABOLIC PANEL
AG RATIO: 1.3 (calc) (ref 1.0–2.5)
ALT: 12 U/L (ref 9–46)
AST: 20 U/L (ref 10–35)
Albumin: 4.2 g/dL (ref 3.6–5.1)
Alkaline phosphatase (APISO): 82 U/L (ref 40–115)
BILIRUBIN TOTAL: 0.6 mg/dL (ref 0.2–1.2)
BUN: 14 mg/dL (ref 7–25)
CHLORIDE: 97 mmol/L — AB (ref 98–110)
CO2: 29 mmol/L (ref 20–32)
CREATININE: 0.92 mg/dL (ref 0.70–1.33)
Calcium: 9.4 mg/dL (ref 8.6–10.3)
Globulin: 3.3 g/dL (calc) (ref 1.9–3.7)
Glucose, Bld: 110 mg/dL — ABNORMAL HIGH (ref 65–99)
POTASSIUM: 4.2 mmol/L (ref 3.5–5.3)
Sodium: 135 mmol/L (ref 135–146)
Total Protein: 7.5 g/dL (ref 6.1–8.1)

## 2018-03-04 LAB — TEST AUTHORIZATION

## 2018-03-04 LAB — CBC WITH DIFFERENTIAL/PLATELET
BASOS ABS: 40 {cells}/uL (ref 0–200)
Basophils Relative: 0.5 %
Eosinophils Absolute: 126 cells/uL (ref 15–500)
Eosinophils Relative: 1.6 %
HEMATOCRIT: 39.4 % (ref 38.5–50.0)
HEMOGLOBIN: 13.7 g/dL (ref 13.2–17.1)
LYMPHS ABS: 4740 {cells}/uL — AB (ref 850–3900)
MCH: 30.4 pg (ref 27.0–33.0)
MCHC: 34.8 g/dL (ref 32.0–36.0)
MCV: 87.6 fL (ref 80.0–100.0)
MPV: 9.9 fL (ref 7.5–12.5)
Monocytes Relative: 8.1 %
NEUTROS ABS: 2354 {cells}/uL (ref 1500–7800)
NEUTROS PCT: 29.8 %
Platelets: 240 10*3/uL (ref 140–400)
RBC: 4.5 10*6/uL (ref 4.20–5.80)
RDW: 12.7 % (ref 11.0–15.0)
Total Lymphocyte: 60 %
WBC: 7.9 10*3/uL (ref 3.8–10.8)
WBCMIX: 640 {cells}/uL (ref 200–950)

## 2018-03-04 LAB — HEMOGLOBIN A1C
HEMOGLOBIN A1C: 5.9 %{Hb} — AB (ref <5.7)
MEAN PLASMA GLUCOSE: 123 (calc)
eAG (mmol/L): 6.8 (calc)

## 2018-03-04 LAB — LIPID PANEL
CHOL/HDL RATIO: 5.5 (calc) — AB (ref <5.0)
Cholesterol: 292 mg/dL — ABNORMAL HIGH (ref <200)
HDL: 53 mg/dL (ref >40)
LDL Cholesterol (Calc): 193 mg/dL (calc) — ABNORMAL HIGH
NON-HDL CHOLESTEROL (CALC): 239 mg/dL — AB (ref <130)
Triglycerides: 257 mg/dL — ABNORMAL HIGH (ref <150)

## 2018-04-02 ENCOUNTER — Other Ambulatory Visit: Payer: Self-pay | Admitting: Internal Medicine

## 2018-04-02 DIAGNOSIS — B20 Human immunodeficiency virus [HIV] disease: Secondary | ICD-10-CM

## 2018-04-07 ENCOUNTER — Other Ambulatory Visit: Payer: BLUE CROSS/BLUE SHIELD

## 2018-04-07 DIAGNOSIS — B20 Human immunodeficiency virus [HIV] disease: Secondary | ICD-10-CM | POA: Diagnosis not present

## 2018-04-08 LAB — CBC
HCT: 40.7 % (ref 38.5–50.0)
HEMOGLOBIN: 13.9 g/dL (ref 13.2–17.1)
MCH: 30.1 pg (ref 27.0–33.0)
MCHC: 34.2 g/dL (ref 32.0–36.0)
MCV: 88.1 fL (ref 80.0–100.0)
MPV: 10 fL (ref 7.5–12.5)
Platelets: 237 10*3/uL (ref 140–400)
RBC: 4.62 10*6/uL (ref 4.20–5.80)
RDW: 13.4 % (ref 11.0–15.0)
WBC: 6.2 10*3/uL (ref 3.8–10.8)

## 2018-04-08 LAB — COMPREHENSIVE METABOLIC PANEL
AG RATIO: 1.4 (calc) (ref 1.0–2.5)
ALT: 14 U/L (ref 9–46)
AST: 23 U/L (ref 10–35)
Albumin: 4.3 g/dL (ref 3.6–5.1)
Alkaline phosphatase (APISO): 87 U/L (ref 40–115)
BUN: 8 mg/dL (ref 7–25)
CHLORIDE: 101 mmol/L (ref 98–110)
CO2: 25 mmol/L (ref 20–32)
Calcium: 9.3 mg/dL (ref 8.6–10.3)
Creat: 0.99 mg/dL (ref 0.70–1.33)
Globulin: 3.1 g/dL (calc) (ref 1.9–3.7)
Glucose, Bld: 170 mg/dL — ABNORMAL HIGH (ref 65–99)
POTASSIUM: 3.9 mmol/L (ref 3.5–5.3)
Sodium: 136 mmol/L (ref 135–146)
Total Bilirubin: 0.5 mg/dL (ref 0.2–1.2)
Total Protein: 7.4 g/dL (ref 6.1–8.1)

## 2018-04-08 LAB — LIPID PANEL
Cholesterol: 263 mg/dL — ABNORMAL HIGH (ref <200)
HDL: 55 mg/dL (ref >40)
LDL CHOLESTEROL (CALC): 174 mg/dL — AB
Non-HDL Cholesterol (Calc): 208 mg/dL (calc) — ABNORMAL HIGH (ref <130)
TRIGLYCERIDES: 184 mg/dL — AB (ref <150)
Total CHOL/HDL Ratio: 4.8 (calc) (ref <5.0)

## 2018-04-08 LAB — RPR: RPR: NONREACTIVE

## 2018-04-08 LAB — T-HELPER CELL (CD4) - (RCID CLINIC ONLY)
CD4 % Helper T Cell: 11 % — ABNORMAL LOW (ref 33–55)
CD4 T Cell Abs: 330 /uL — ABNORMAL LOW (ref 400–2700)

## 2018-04-10 LAB — HIV-1 RNA QUANT-NO REFLEX-BLD
HIV 1 RNA Quant: 39 copies/mL — ABNORMAL HIGH
HIV-1 RNA Quant, Log: 1.59 Log copies/mL — ABNORMAL HIGH

## 2018-04-14 ENCOUNTER — Telehealth: Payer: Self-pay | Admitting: *Deleted

## 2018-04-14 NOTE — Telephone Encounter (Signed)
Called patietn to let him know his medication arrived from the pharmacy. Andree CossHowell, Lilja Soland M, RN

## 2018-04-23 ENCOUNTER — Encounter: Payer: BLUE CROSS/BLUE SHIELD | Admitting: Internal Medicine

## 2018-04-26 ENCOUNTER — Other Ambulatory Visit: Payer: Self-pay

## 2018-04-26 ENCOUNTER — Encounter (HOSPITAL_BASED_OUTPATIENT_CLINIC_OR_DEPARTMENT_OTHER): Payer: Self-pay | Admitting: *Deleted

## 2018-04-26 ENCOUNTER — Emergency Department (HOSPITAL_BASED_OUTPATIENT_CLINIC_OR_DEPARTMENT_OTHER): Payer: BLUE CROSS/BLUE SHIELD

## 2018-04-26 ENCOUNTER — Emergency Department (HOSPITAL_BASED_OUTPATIENT_CLINIC_OR_DEPARTMENT_OTHER)
Admission: EM | Admit: 2018-04-26 | Discharge: 2018-04-26 | Disposition: A | Discharge status: Home / Self Care | Payer: BLUE CROSS/BLUE SHIELD | Attending: Emergency Medicine | Admitting: Emergency Medicine

## 2018-04-26 DIAGNOSIS — Z79899 Other long term (current) drug therapy: Secondary | ICD-10-CM | POA: Diagnosis not present

## 2018-04-26 DIAGNOSIS — R0789 Other chest pain: Secondary | ICD-10-CM | POA: Diagnosis not present

## 2018-04-26 DIAGNOSIS — R079 Chest pain, unspecified: Secondary | ICD-10-CM | POA: Diagnosis not present

## 2018-04-26 DIAGNOSIS — R05 Cough: Secondary | ICD-10-CM | POA: Diagnosis not present

## 2018-04-26 DIAGNOSIS — I10 Essential (primary) hypertension: Secondary | ICD-10-CM | POA: Diagnosis not present

## 2018-04-26 DIAGNOSIS — F1010 Alcohol abuse, uncomplicated: Secondary | ICD-10-CM | POA: Insufficient documentation

## 2018-04-26 DIAGNOSIS — F17210 Nicotine dependence, cigarettes, uncomplicated: Secondary | ICD-10-CM | POA: Diagnosis not present

## 2018-04-26 DIAGNOSIS — Z21 Asymptomatic human immunodeficiency virus [HIV] infection status: Secondary | ICD-10-CM | POA: Diagnosis not present

## 2018-04-26 DIAGNOSIS — R42 Dizziness and giddiness: Secondary | ICD-10-CM | POA: Diagnosis not present

## 2018-04-26 DIAGNOSIS — J40 Bronchitis, not specified as acute or chronic: Secondary | ICD-10-CM | POA: Diagnosis not present

## 2018-04-26 MED ORDER — DEXAMETHASONE SODIUM PHOSPHATE 10 MG/ML IJ SOLN
10.0000 mg | Freq: Once | INTRAMUSCULAR | Status: AC
  Administered 2018-04-26: 10 mg via INTRAMUSCULAR
  Filled 2018-04-26: qty 1

## 2018-04-26 MED ORDER — ALBUTEROL SULFATE HFA 108 (90 BASE) MCG/ACT IN AERS: 2.0000 | INHALATION_SPRAY | RESPIRATORY_TRACT | 0 refills | Status: DC | PRN

## 2018-04-26 MED ORDER — SALINE SPRAY 0.65 % NA SOLN: 1.0000 | NASAL | 0 refills | Status: DC | PRN

## 2018-04-26 MED ORDER — FLUTICASONE PROPIONATE 50 MCG/ACT NA SUSP: 2.0000 | Freq: Every day | NASAL | 0 refills | Status: DC

## 2018-04-26 MED ORDER — ALBUTEROL SULFATE HFA 108 (90 BASE) MCG/ACT IN AERS
2.0000 | INHALATION_SPRAY | Freq: Once | RESPIRATORY_TRACT | Status: AC
  Administered 2018-04-26: 2 via RESPIRATORY_TRACT
  Filled 2018-04-26: qty 6.7

## 2018-04-26 NOTE — Discharge Instructions (Addendum)
You were seen today for a cough.  Your x-ray does not show any evidence of pneumonia.  Given your smoking history, you were given a dose of steroids and an inhaler.  Another consideration is postnasal drip.  Use Flonase and nasal saline to help with this.  If you develop fevers, worsening cough, shortness of breath, any new or worsening symptoms you should be reevaluated.

## 2018-04-26 NOTE — ED Provider Notes (Signed)
[facility] EMERGENCY DEPARTMENT Provider Note   CSN: [id number] Arrival date & time: 04/26/18  2238     History   Chief Complaint Chief Complaint  Patient presents with  . Cough    HPI Brandon Quinn is a 52 y.o. male.  HPI  This is a 52 year old male with a history of HIV, hypertension, tobacco abuse who presents with cough.  Patient reports 1 week history of cough.  He states at times it is productive of mucus.  He has not noted any fevers.  He has noted some ear pressure.  No noted rhinorrhea or congestion.  No other upper respiratory symptoms.  He denies shortness of breath.  He does state that when he coughs forcefully he will have some anterior chest pain.  This is only with coughing.  He is not currently having any chest pain.  Additionally he states sometimes he coughs a lot causing him to feel lightheaded.  If he is able to take deep breath this improves his lightheadedness.  Patient has used some honey and lemon at night which seems to help some.  He reports compliance with his HIV medications and "good control."  Denies recent travel or hospitalization.  Is currently smoking.  I reviewed the patient's chart.  He follows closely with infectious disease.  Last CD4 count (July 2019) was 330.  Previously his viral load was undetectable but most recent with an increase to 39 copies per mL.  Past Medical History:  Diagnosis Date  . Chest pain    a. 07/2012 Lexiscan Sestamibi:  no ischemia/infarct, nl EF.  Marland Kitchen. HIV infection (HCC)   . Hyperlipidemia   . Hypertension   . Substance abuse (HCC)    alcohol   . Tobacco abuse     Patient Active Problem List   Diagnosis Date Noted  . Hypertensive emergency 06/02/2017  . Alcohol abuse 06/01/2017  . Pain, dental 07/29/2016  . Cough 07/29/2016  . Rash 06/23/2015  . HIV disease (HCC) 05/18/2015  . Acute gouty arthritis 01/30/2015  . Oral thrush 01/30/2015  . Tobacco abuse 07/30/2012  . Hypertension 07/29/2012    Past  Surgical History:  Procedure Laterality Date  . WRIST FRACTURE SURGERY  ~ 2009   right; "put a plate in it" (40/98/119110/24/2013)        Home Medications    Prior to Admission medications   Medication Sig Start Date End Date Taking? Authorizing Provider  allopurinol (ZYLOPRIM) 100 MG tablet Take 1 tablet (100 mg total) by mouth daily. 02/13/18  Yes Sharon SellerEubanks, Jessica K, NP  atorvastatin (LIPITOR) 80 MG tablet 1/2 Tablet by mouth daily x 1 week then increase to 1 tablet daily 03/03/18  Yes Sharon SellerEubanks, Jessica K, NP  colchicine 0.6 MG tablet Take this one pill 1 hour after your first dose given in the ED 02/13/18  Yes Melene PlanFloyd, Dan, DO  hydrochlorothiazide (HYDRODIURIL) 50 MG tablet Take 1 tablet (50 mg total) by mouth daily. 02/26/18  Yes Sharon SellerEubanks, Jessica K, NP  lisinopril (PRINIVIL,ZESTRIL) 10 MG tablet TAKE 1 TABLET(10 MG) BY MOUTH DAILY 02/25/18  Yes Sharon Sellerubanks, Jessica K, NP  ODEFSEY 200-25-25 MG TABS tablet TAKE ONE TABLET BY MOUTH EVERY DAY 04/03/18  Yes Cliffton Astersampbell, John, MD  albuterol (PROVENTIL HFA;VENTOLIN HFA) 108 (90 Base) MCG/ACT inhaler Inhale 2 puffs into the lungs every 4 (four) hours as needed for wheezing or shortness of breath. 04/26/18   Baani Bober, Mayer Maskerourtney F, MD  aspirin EC 81 MG tablet Take 81 mg by mouth daily.  [provider]  fluticasone (FLONASE) 50 MCG/ACT nasal spray Place 2 sprays into both nostrils daily. 04/26/18   Mauria Asquith, Mayer Masker, MD  sodium chloride (OCEAN) 0.65 % SOLN nasal spray Place 1 spray into both nostrils as needed for congestion. 04/26/18   Dondre Catalfamo, Mayer Masker, MD    Family History Family History  Problem Relation Age of Onset  . Other Unknown        No known heart disease    Social History Social History   Tobacco Use  . Smoking status: Current Every Day Smoker    Packs/day: 1.00    Years: 26.00    Pack years: 26.00    Types: Cigarettes  . Smokeless tobacco: Never Used  . Tobacco comment: states he is trying to cut back  Substance Use Topics  . Alcohol  use: Yes    Alcohol/week: 3.0 oz    Types: 5 Cans of beer per week    Comment: 2 to 3 22oz beers per day; no liquor  . Drug use: Not Currently     Allergies   Genvoya [elviteg-cobic-emtricit-tenofaf]   Review of Systems Review of Systems  Constitutional: Negative for fever.  HENT: Positive for ear pain. Negative for congestion, rhinorrhea and sore throat.   Respiratory: Positive for cough and chest tightness. Negative for shortness of breath.   Cardiovascular: Negative for chest pain.  Gastrointestinal: Negative for abdominal pain, nausea and vomiting.  Genitourinary: Negative for dysuria.  Neurological: Positive for dizziness.  All other systems reviewed and are negative.    Physical Exam Updated Vital Signs BP (!) 159/75 (BP Location: Right Arm)   Pulse 95   Temp 98.2 F (36.8 C) (Oral)   Resp 20   Ht 6' (1.829 m)   Wt 90.7 kg (200 lb)   SpO2 99%   BMI 27.12 kg/m   Physical Exam  Constitutional: He is oriented to person, place, and time. He appears well-developed and well-nourished. No distress.  HENT:  Head: Normocephalic and atraumatic.  Bilateral TMs clear, mild effusion, intact light reflex  Eyes: Pupils are equal, round, and reactive to light.  Neck: Normal range of motion. Neck supple.  Cardiovascular: Normal rate, regular rhythm and normal heart sounds.  No murmur heard. Pulmonary/Chest: Effort normal and breath sounds normal. No respiratory distress. He has no wheezes.  Abdominal: Soft. Bowel sounds are normal. There is no tenderness. There is no rebound.  Musculoskeletal: He exhibits no edema.  Lymphadenopathy:    He has no cervical adenopathy.  Neurological: He is alert and oriented to person, place, and time.  Skin: Skin is warm and dry.  Psychiatric: He has a normal mood and affect.  Nursing note and vitals reviewed.    ED Treatments / Results  Labs (all labs ordered are listed, but only abnormal results are displayed) Labs Reviewed - No  data to display  EKG EKG Interpretation  Date/Time:  Sunday April 26 2018 23:08:32 EDT Ventricular Rate:  89 PR Interval:    QRS Duration: 143 QT Interval:  403 QTC Calculation: 491 R Axis:   -46 Text Interpretation:  Sinus rhythm RBBB and LAFB LVH by voltage Borderline prolonged QT interval Baseline wander in lead(s) V2 RBBB and LAFB new when compared to prior Confirmed by Ross Marcus ([id number]) on 04/26/2018 11:11:19 PM   Radiology Dg Chest 2 View  Result Date: 04/26/2018 CLINICAL DATA:  Cough, fever, and chest pain for 1 week. EXAM: CHEST - 2 VIEW COMPARISON:  08/18/2017 FINDINGS: The heart size  and mediastinal contours are within normal limits. Both lungs are clear. The visualized skeletal structures are unremarkable. IMPRESSION: Stable exam.  No active cardiopulmonary disease. Electronically Signed   By: Myles Rosenthal M.D.   On: 04/26/2018 23:20    Procedures Procedures (including critical care time)  Medications Ordered in ED Medications  dexamethasone (DECADRON) injection 10 mg (has no administration in time range)  albuterol (PROVENTIL HFA;VENTOLIN HFA) 108 (90 Base) MCG/ACT inhaler 2 puff (has no administration in time range)     Initial Impression / Assessment and Plan / ED Course  I have reviewed the triage vital signs and the nursing notes.  Pertinent labs & imaging results that were available during my care of the patient were reviewed by me and considered in my medical decision making (see chart for details).     Patient presents with cough.  He is overall nontoxic-appearing on exam.  Vital signs are unremarkable.  He is afebrile.  His exam is fairly benign.  No significant wheezing.  Breath sounds are clear.  He does not appear volume overloaded.  Doubt CHF.  He reports some ear pressure but air exam is reassuring.  Suspect this may be bronchitis given his history of smoking.  He was given Decadron and albuterol.  Chest x-ray is clear without evidence of pneumonia  or pneumothorax.  While he now does have a detectable viral load it is still very low.  Doubt complication from HIV.  Recommend supportive measures.  Patient given Flonase and nasal saline for any element of postnasal drip.  Additionally continue albuterol at home for symptom control.  After history, exam, and medical workup I feel the patient has been appropriately medically screened and is safe for discharge home. Pertinent diagnoses were discussed with the patient. Patient was given return precautions.   Final Clinical Impressions(s) / ED Diagnoses   Final diagnoses:  Bronchitis    ED Discharge Orders        Ordered    sodium chloride (OCEAN) 0.65 % SOLN nasal spray  As needed     04/26/18 2325    fluticasone (FLONASE) 50 MCG/ACT nasal spray  Daily     04/26/18 2325    albuterol (PROVENTIL HFA;VENTOLIN HFA) 108 (90 Base) MCG/ACT inhaler  Every 4 hours PRN     04/26/18 2325       Shon Baton, MD 04/26/18 2332

## 2018-04-26 NOTE — ED Triage Notes (Signed)
Pt reports productive cough x 1 week. States he gets dizzy after coughing and feels like he is going to pass out

## 2018-05-02 ENCOUNTER — Emergency Department (HOSPITAL_BASED_OUTPATIENT_CLINIC_OR_DEPARTMENT_OTHER)
Admission: EM | Admit: 2018-05-02 | Discharge: 2018-05-02 | Disposition: A | Discharge status: Home / Self Care | Payer: BLUE CROSS/BLUE SHIELD | Attending: Emergency Medicine | Admitting: Emergency Medicine

## 2018-05-02 ENCOUNTER — Encounter (HOSPITAL_BASED_OUTPATIENT_CLINIC_OR_DEPARTMENT_OTHER): Payer: Self-pay | Admitting: Emergency Medicine

## 2018-05-02 ENCOUNTER — Other Ambulatory Visit: Payer: Self-pay

## 2018-05-02 DIAGNOSIS — F17210 Nicotine dependence, cigarettes, uncomplicated: Secondary | ICD-10-CM | POA: Diagnosis not present

## 2018-05-02 DIAGNOSIS — R05 Cough: Secondary | ICD-10-CM | POA: Insufficient documentation

## 2018-05-02 DIAGNOSIS — Z7982 Long term (current) use of aspirin: Secondary | ICD-10-CM | POA: Diagnosis not present

## 2018-05-02 DIAGNOSIS — Z79899 Other long term (current) drug therapy: Secondary | ICD-10-CM | POA: Insufficient documentation

## 2018-05-02 DIAGNOSIS — I10 Essential (primary) hypertension: Secondary | ICD-10-CM | POA: Diagnosis not present

## 2018-05-02 DIAGNOSIS — R059 Cough, unspecified: Secondary | ICD-10-CM

## 2018-05-02 MED ORDER — SODIUM CHLORIDE 0.9 % IV BOLUS
1000.0000 mL | Freq: Once | INTRAVENOUS | Status: AC
  Administered 2018-05-02: 1000 mL via INTRAVENOUS

## 2018-05-02 NOTE — Discharge Instructions (Addendum)
Please continue to take fluticasone for symptomatic control.Continue to hydrate with fluids and gatorade. Please refrain from smoking until your symptoms improve. If you experience any of the following symptoms please return to the ED:  You cough up blood. You have difficulty breathing. Your heartbeat is very fast.

## 2018-05-02 NOTE — ED Triage Notes (Signed)
Coughing x over 1 week. Was seen here a few days ago. States "something busted on my R side when I coughed today."

## 2018-05-02 NOTE — ED Provider Notes (Signed)
[facility] EMERGENCY DEPARTMENT Provider Note   CSN: [id number] Arrival date & time: 05/02/18  9562     History   Chief Complaint Chief Complaint  Patient presents with  . Cough    HPI Cashton Hosley is a 52 y.o. male.  52 y.o male with a PMH of alcohol abuse, HTN, HLD, HIV presents to the ED complaining of a cough for over 1 week. Patient states he was seen here a couple of days ago and states he feels his symptoms are getting worse. He describes the cough as non productive. He states he has tried taking fluticasone and has had no relieve in symptoms. Patient complains of right rib pain when coughing.He had a normal chest xray on 7/21. He denies any rhinorrhea, chest pain, shortness of breath or abdominal symptoms.      Past Medical History:  Diagnosis Date  . Chest pain    a. 07/2012 Lexiscan Sestamibi:  no ischemia/infarct, nl EF.  Marland Kitchen HIV infection (HCC)   . Hyperlipidemia   . Hypertension   . Substance abuse (HCC)    alcohol   . Tobacco abuse     Patient Active Problem List   Diagnosis Date Noted  . Hypertensive emergency 06/02/2017  . Alcohol abuse 06/01/2017  . Pain, dental 07/29/2016  . Cough 07/29/2016  . Rash 06/23/2015  . HIV disease (HCC) 05/18/2015  . Acute gouty arthritis 01/30/2015  . Oral thrush 01/30/2015  . Tobacco abuse 07/30/2012  . Hypertension 07/29/2012    Past Surgical History:  Procedure Laterality Date  . WRIST FRACTURE SURGERY  ~ 2009   right; "put a plate in it" (13/05/6577)        Home Medications    Prior to Admission medications   Medication Sig Start Date End Date Taking? Authorizing Provider  albuterol (PROVENTIL HFA;VENTOLIN HFA) 108 (90 Base) MCG/ACT inhaler Inhale 2 puffs into the lungs every 4 (four) hours as needed for wheezing or shortness of breath. 04/26/18   Horton, Mayer Masker, MD  allopurinol (ZYLOPRIM) 100 MG tablet Take 1 tablet (100 mg total) by mouth daily. 02/13/18   Sharon Seller, NP  aspirin  EC 81 MG tablet Take 81 mg by mouth daily.    [provider]  atorvastatin (LIPITOR) 80 MG tablet 1/2 Tablet by mouth daily x 1 week then increase to 1 tablet daily 03/03/18   Sharon Seller, NP  colchicine 0.6 MG tablet Take this one pill 1 hour after your first dose given in the ED 02/13/18   Melene Plan, DO  fluticasone [facility]) 50 MCG/ACT nasal spray Place 2 sprays into both nostrils daily. 04/26/18   Horton, Mayer Masker, MD  hydrochlorothiazide (HYDRODIURIL) 50 MG tablet Take 1 tablet (50 mg total) by mouth daily. 02/26/18   Sharon Seller, NP  lisinopril (PRINIVIL,ZESTRIL) 10 MG tablet TAKE 1 TABLET(10 MG) BY MOUTH DAILY 02/25/18   Sharon Seller, NP  ODEFSEY 200-25-25 MG TABS tablet TAKE ONE TABLET BY MOUTH EVERY DAY 04/03/18   Cliffton Asters, MD  sodium chloride (OCEAN) 0.65 % SOLN nasal spray Place 1 spray into both nostrils as needed for congestion. 04/26/18   Horton, Mayer Masker, MD    Family History Family History  Problem Relation Age of Onset  . Other Unknown        No known heart disease    Social History Social History   Tobacco Use  . Smoking status: Current Every Day Smoker    Packs/day: 1.00  Years: 26.00    Pack years: 26.00    Types: Cigarettes  . Smokeless tobacco: Never Used  . Tobacco comment: states he is trying to cut back  Substance Use Topics  . Alcohol use: Yes    Alcohol/week: 3.0 oz    Types: 5 Cans of beer per week    Comment: 2 to 3 22oz beers per day; no liquor  . Drug use: Not Currently     Allergies   Genvoya [elviteg-cobic-emtricit-tenofaf]   Review of Systems Review of Systems  Constitutional: Negative for chills and fever.  HENT: Negative for ear pain, postnasal drip, rhinorrhea, sinus pressure, sneezing, sore throat, trouble swallowing and voice change.   Eyes: Negative for pain and visual disturbance.  Respiratory: Positive for cough (non productive). Negative for shortness of breath.   Cardiovascular: Negative for  chest pain and palpitations.  Gastrointestinal: Negative for abdominal pain and vomiting.  Genitourinary: Negative for dysuria and hematuria.  Musculoskeletal: Negative for arthralgias and back pain.  Skin: Negative for color change and rash.  Neurological: Negative for seizures and syncope.  All other systems reviewed and are negative.    Physical Exam Updated Vital Signs BP (!) 146/52 (BP Location: Left Arm)   Pulse 90   Temp 98.8 F (37.1 C) (Oral)   Resp 20   Ht 6' (1.829 m)   Wt 90.7 kg (200 lb)   SpO2 100%   BMI 27.12 kg/m   Physical Exam  Constitutional: He is oriented to person, place, and time. He appears well-developed and well-nourished.  HENT:  Head: Normocephalic and atraumatic.  Mouth/Throat: Oropharynx is clear and moist.  Eyes: Pupils are equal, round, and reactive to light. No scleral icterus.  Neck: Normal range of motion.  Cardiovascular: Normal heart sounds.  Pulses:      Radial pulses are 2+ on the right side, and 2+ on the left side.  Pulmonary/Chest: Effort normal and breath sounds normal. No accessory muscle usage. No apnea and no tachypnea. No respiratory distress. He has no decreased breath sounds. He has no wheezes. He has no rhonchi. He has no rales. He exhibits no tenderness.  Lungs sound present across all lung fields, no wheezing or rales.   Abdominal: Soft. Bowel sounds are normal. He exhibits no distension. There is no tenderness.  Musculoskeletal: He exhibits no tenderness or deformity.  Neurological: He is alert and oriented to person, place, and time.  Skin: Skin is warm and dry.  Nursing note and vitals reviewed.    ED Treatments / Results  Labs (all labs ordered are listed, but only abnormal results are displayed) Labs Reviewed - No data to display  EKG None  Radiology No results found.  Procedures Procedures (including critical care time)  Medications Ordered in ED Medications  sodium chloride 0.9 % bolus 1,000 mL  (1,000 mLs Intravenous New Bag/Given 05/02/18 1939)     Initial Impression / Assessment and Plan / ED Course  I have reviewed the triage vital signs and the nursing notes.  Pertinent labs & imaging results that were available during my care of the patient were reviewed by me and considered in my medical decision making (see chart for details).     Patient returned to the ED today after seen on 7/21, he states his symptoms are getting worse and he is not able to cough up anything.Patient had a normal chest xray on 7/21.He states when he had this symptoms years ago "they gave me fluids and made me feel  better". There is no wheezing on exam, no rhonchi or rales.  Patient states he is feeling better after half of bag of fluids. He also says " I am not drinking water"He is also a smoker. I have advised patient that he needs to cut back on smoking until cough has gotten better. At this time his vitals are stable, and he seems to be feeling better. There is not swelling of this left, no night sweats, at this time I believe CHF or infection are less likely.   Final Clinical Impressions(s) / ED Diagnoses   Final diagnoses:  Cough    ED Discharge Orders    None       Claude MangesSoto, Benelli Winther, Cordelia Poche-C 05/02/18 2016    Raeford RazorKohut, Stephen, MD 05/03/18 [phone]

## 2018-05-04 ENCOUNTER — Ambulatory Visit (INDEPENDENT_AMBULATORY_CARE_PROVIDER_SITE_OTHER): Payer: BLUE CROSS/BLUE SHIELD | Admitting: Internal Medicine

## 2018-05-04 DIAGNOSIS — B20 Human immunodeficiency virus [HIV] disease: Secondary | ICD-10-CM | POA: Diagnosis not present

## 2018-05-04 DIAGNOSIS — Z720 Tobacco use: Secondary | ICD-10-CM | POA: Diagnosis not present

## 2018-05-04 DIAGNOSIS — R05 Cough: Secondary | ICD-10-CM

## 2018-05-04 DIAGNOSIS — R059 Cough, unspecified: Secondary | ICD-10-CM

## 2018-05-04 MED ORDER — ACETAMINOPHEN-CODEINE #3 300-30 MG PO TABS: 1.0000 | ORAL_TABLET | Freq: Four times a day (QID) | ORAL | 0 refills | Status: DC | PRN

## 2018-05-04 NOTE — Assessment & Plan Note (Signed)
I strongly encouraged him to work on quitting cigarettes completely.  I told him that smoking greatly increases his risk of respiratory infection and makes it harder to recover.

## 2018-05-04 NOTE — Assessment & Plan Note (Signed)
He has acute bronchitis.  I will treat him with a very short course of Tylenol with codeine to help with his pleuritic chest pain and to suppress his cough.

## 2018-05-04 NOTE — Progress Notes (Signed)
[facility]  Patient Active Problem List   Diagnosis Date Noted  . HIV disease (HCC) 05/18/2015    Priority: High  . Hypertensive emergency 06/02/2017  . Alcohol abuse 06/01/2017  . Pain, dental 07/29/2016  . Cough 07/29/2016  . Rash 06/23/2015  . Acute gouty arthritis 01/30/2015  . Oral thrush 01/30/2015  . Tobacco abuse 07/30/2012  . Hypertension 07/29/2012    Patient's Medications  New Prescriptions   ACETAMINOPHEN-CODEINE (TYLENOL #3) 300-30 MG TABLET    Take 1-2 tablets by mouth every 6 (six) hours as needed for moderate pain.  Previous Medications   ALBUTEROL (PROVENTIL HFA;VENTOLIN HFA) 108 (90 BASE) MCG/ACT INHALER    Inhale 2 puffs into the lungs every 4 (four) hours as needed for wheezing or shortness of breath.   ALLOPURINOL (ZYLOPRIM) 100 MG TABLET    Take 1 tablet (100 mg total) by mouth daily.   ASPIRIN EC 81 MG TABLET    Take 81 mg by mouth daily.   ATORVASTATIN (LIPITOR) 80 MG TABLET    1/2 Tablet by mouth daily x 1 week then increase to 1 tablet daily   COLCHICINE 0.6 MG TABLET    Take this one pill 1 hour after your first dose given in the ED   FLUTICASONE (FLONASE) 50 MCG/ACT NASAL SPRAY    Place 2 sprays into both nostrils daily.   HYDROCHLOROTHIAZIDE (HYDRODIURIL) 50 MG TABLET    Take 1 tablet (50 mg total) by mouth daily.   LISINOPRIL (PRINIVIL,ZESTRIL) 10 MG TABLET    TAKE 1 TABLET(10 MG) BY MOUTH DAILY   ODEFSEY 200-25-25 MG TABS TABLET    TAKE ONE TABLET BY MOUTH EVERY DAY   SODIUM CHLORIDE (OCEAN) 0.65 % SOLN NASAL SPRAY    Place 1 spray into both nostrils as needed for congestion.  Modified Medications   No medications on file  Discontinued Medications   No medications on file    Subjective: Brandon Quinn is seen on a working basis.  About 2 weeks ago he developed a sore throat and cough.  He was seen in the emergency department on 04/26/2018.  His exam and chest x-ray were unremarkable.  He was treated with Decadron,  fluticasone and albuterol.  He was told to take over-the-counter cough suppressants.  He tried NyQuil, Robitussin, Aleve and tea with honey and lemon without much relief.  He was seen back in the emergency department on 05/02/2018.  Again, his exam was unremarkable.  He was given IV fluids.  He continues to be bothered by a hacking cough.  On 2 occasions he felt like he might pass out while coughing.  His cough is nonproductive.  He had a mild night sweats last night.  He is not sure if he has had any fever.  He has not had much sinus congestion.  He denies heartburn.  He was late taking his Odefsey on 2 occasions in the last 6 months but has not missed any doses.  Review of Systems: Review of Systems  Constitutional: Positive for diaphoresis and malaise/fatigue. Negative for chills and fever.  HENT: Positive for sore throat. Negative for congestion.   Respiratory: Positive for cough. Negative for hemoptysis, sputum production and shortness of breath.   Cardiovascular: Positive for chest pain.       He has some right-sided chest pain when coughing.  Gastrointestinal: Negative for abdominal pain, diarrhea, nausea and vomiting.  Musculoskeletal: Negative for back pain.  Skin: Negative  for rash.  Neurological: Negative for headaches.    Past Medical History:  Diagnosis Date  . Chest pain    a. 07/2012 Lexiscan Sestamibi:  no ischemia/infarct, nl EF.  Marland Kitchen. HIV infection (HCC)   . Hyperlipidemia   . Hypertension   . Substance abuse (HCC)    alcohol   . Tobacco abuse     Social History   Tobacco Use  . Smoking status: Current Every Day Smoker    Packs/day: 1.00    Years: 26.00    Pack years: 26.00    Types: Cigarettes  . Smokeless tobacco: Never Used  . Tobacco comment: states he is trying to cut back  Substance Use Topics  . Alcohol use: Yes    Alcohol/week: 3.0 oz    Types: 5 Cans of beer per week    Comment: 2 to 3 22oz beers per day; no liquor  . Drug use: Not Currently     Family History  Problem Relation Age of Onset  . Other Unknown        No known heart disease    Allergies  Allergen Reactions  . Genvoya [Elviteg-Cobic-Emtricit-Tenofaf] Rash    Objective: Vitals:   05/04/18 1538  BP: 124/70  Pulse: 86  Temp: 98.5 F (36.9 C)  TempSrc: Oral  Weight: 207 lb (93.9 kg)   Body mass index is 28.07 kg/m.  Physical Exam  Constitutional: He is oriented to person, place, and time.  He has frequent dry coughing during the exam.  HENT:  Mouth/Throat: No oropharyngeal exudate.  Cardiovascular: Normal rate, regular rhythm and normal heart sounds.  No murmur heard. Pulmonary/Chest: Effort normal and breath sounds normal. He has no wheezes. He has no rales.  Abdominal: Soft. There is no tenderness.  Neurological: He is alert and oriented to person, place, and time.  Skin: No rash noted.  Psychiatric: He has a normal mood and affect.    Lab Results    Problem List Items Addressed This Visit      High   HIV disease (HCC)    His infection has been under good control since starting therapy 3 years ago and he is having steady CD4 reconstitution.  He will continue Odefsey and follow-up after lab work in 6 months      Relevant Orders   T-helper cell (CD4)- ([facility] only)   HIV 1 RNA quant-no reflex-bld     Unprioritized   Cough    He has acute bronchitis.  I will treat him with a very short course of Tylenol with codeine to help with his pleuritic chest pain and to suppress his cough.      Relevant Medications   acetaminophen-codeine (TYLENOL #3) 300-30 MG tablet   Tobacco abuse    I strongly encouraged him to work on quitting cigarettes completely.  I told him that smoking greatly increases his risk of respiratory infection and makes it harder to recover.          Cliffton AstersJohn Laurren Lepkowski, MD [facility] [phone] pager   [phone] cell 05/04/2018, 4:09 PM

## 2018-05-04 NOTE — Assessment & Plan Note (Signed)
His infection has been under good control since starting therapy 3 years ago and he is having steady CD4 reconstitution.  He will continue Odefsey and follow-up after lab work in 6 months

## 2018-06-30 ENCOUNTER — Ambulatory Visit: Payer: Self-pay

## 2018-06-30 ENCOUNTER — Other Ambulatory Visit: Payer: Self-pay | Admitting: Behavioral Health

## 2018-06-30 ENCOUNTER — Encounter: Payer: Self-pay | Admitting: Internal Medicine

## 2018-06-30 DIAGNOSIS — B20 Human immunodeficiency virus [HIV] disease: Secondary | ICD-10-CM

## 2018-06-30 MED ORDER — EMTRICITAB-RILPIVIR-TENOFOV AF 200-25-25 MG PO TABS: 1.0000 | ORAL_TABLET | Freq: Every day | ORAL | 2 refills | Status: DC

## 2018-07-07 ENCOUNTER — Other Ambulatory Visit: Payer: Self-pay | Admitting: Behavioral Health

## 2018-07-07 DIAGNOSIS — B20 Human immunodeficiency virus [HIV] disease: Secondary | ICD-10-CM

## 2018-07-07 MED ORDER — EMTRICITAB-RILPIVIR-TENOFOV AF 200-25-25 MG PO TABS: 1.0000 | ORAL_TABLET | Freq: Every day | ORAL | 2 refills | Status: DC

## 2018-07-08 ENCOUNTER — Encounter (HOSPITAL_BASED_OUTPATIENT_CLINIC_OR_DEPARTMENT_OTHER): Payer: Self-pay | Admitting: *Deleted

## 2018-07-08 ENCOUNTER — Emergency Department (HOSPITAL_BASED_OUTPATIENT_CLINIC_OR_DEPARTMENT_OTHER): Payer: Self-pay

## 2018-07-08 ENCOUNTER — Other Ambulatory Visit: Payer: Self-pay

## 2018-07-08 ENCOUNTER — Emergency Department (HOSPITAL_BASED_OUTPATIENT_CLINIC_OR_DEPARTMENT_OTHER)
Admission: EM | Admit: 2018-07-08 | Discharge: 2018-07-08 | Disposition: A | Discharge status: Home / Self Care | Payer: Self-pay | Attending: Emergency Medicine | Admitting: Emergency Medicine

## 2018-07-08 DIAGNOSIS — I10 Essential (primary) hypertension: Secondary | ICD-10-CM | POA: Insufficient documentation

## 2018-07-08 DIAGNOSIS — M25561 Pain in right knee: Secondary | ICD-10-CM

## 2018-07-08 DIAGNOSIS — M25461 Effusion, right knee: Secondary | ICD-10-CM | POA: Insufficient documentation

## 2018-07-08 DIAGNOSIS — Z79899 Other long term (current) drug therapy: Secondary | ICD-10-CM | POA: Insufficient documentation

## 2018-07-08 DIAGNOSIS — B20 Human immunodeficiency virus [HIV] disease: Secondary | ICD-10-CM | POA: Insufficient documentation

## 2018-07-08 DIAGNOSIS — F17210 Nicotine dependence, cigarettes, uncomplicated: Secondary | ICD-10-CM | POA: Insufficient documentation

## 2018-07-08 DIAGNOSIS — Z7982 Long term (current) use of aspirin: Secondary | ICD-10-CM | POA: Insufficient documentation

## 2018-07-08 MED ORDER — NAPROXEN 250 MG PO TABS
500.0000 mg | ORAL_TABLET | Freq: Once | ORAL | Status: AC
  Administered 2018-07-08: 500 mg via ORAL
  Filled 2018-07-08: qty 2

## 2018-07-08 MED ORDER — NAPROXEN 500 MG PO TABS: 500.0000 mg | ORAL_TABLET | Freq: Two times a day (BID) | ORAL | 0 refills | Status: AC

## 2018-07-08 NOTE — ED Notes (Signed)
Gait steady

## 2018-07-08 NOTE — ED Provider Notes (Signed)
[facility] EMERGENCY DEPARTMENT Provider Note   CSN: [id number] Arrival date & time: 07/08/18  1427     History   Chief Complaint Chief Complaint  Patient presents with  . Knee Pain    HPI Brandon Quinn is a 52 y.o. male.  52 y.o male with a PMH of gout,HTN, HLD presents to the ED with a chief complaint of right knee pain x 4 days. Patient reports he got up in his right knee started to swell.  Ports most of his pain is above the patella with no radiation to the medial or lateral side.  It is worse with knee flexion and extension.  Patient has tried Aleve multiple times and states no relief in pain, he has also used a knee brace which has not improved his symptoms.There are no alliviating factors.He denies any fever, trauma or any urinary symptoms.      Past Medical History:  Diagnosis Date  . Chest pain    a. 07/2012 Lexiscan Sestamibi:  no ischemia/infarct, nl EF.  Marland Kitchen HIV infection (HCC)   . Hyperlipidemia   . Hypertension   . Substance abuse (HCC)    alcohol   . Tobacco abuse     Patient Active Problem List   Diagnosis Date Noted  . Hypertensive emergency 06/02/2017  . Alcohol abuse 06/01/2017  . Pain, dental 07/29/2016  . Cough 07/29/2016  . Rash 06/23/2015  . HIV disease (HCC) 05/18/2015  . Acute gouty arthritis 01/30/2015  . Oral thrush 01/30/2015  . Tobacco abuse 07/30/2012  . Hypertension 07/29/2012    Past Surgical History:  Procedure Laterality Date  . WRIST FRACTURE SURGERY  ~ 2009   right; "put a plate in it" (69/62/9528)        Home Medications    Prior to Admission medications   Medication Sig Start Date End Date Taking? Authorizing Provider  acetaminophen-codeine (TYLENOL #3) 300-30 MG tablet Take 1-2 tablets by mouth every 6 (six) hours as needed for moderate pain. 05/04/18   Cliffton Asters, MD  albuterol (PROVENTIL HFA;VENTOLIN HFA) [phone] Base) MCG/ACT inhaler Inhale 2 puffs into the lungs every 4 (four) hours as needed for  wheezing or shortness of breath. 04/26/18   Horton, Mayer Masker, MD  allopurinol (ZYLOPRIM) 100 MG tablet Take 1 tablet (100 mg total) by mouth daily. 02/13/18   Sharon Seller, NP  aspirin EC 81 MG tablet Take 81 mg by mouth daily.    [provider]  atorvastatin (LIPITOR) 80 MG tablet 1/2 Tablet by mouth daily x 1 week then increase to 1 tablet daily 03/03/18   Sharon Seller, NP  colchicine 0.6 MG tablet Take this one pill 1 hour after your first dose given in the ED Patient not taking: Reported on 05/04/2018 02/13/18   Melene Plan, DO  emtricitabine-rilpivir-tenofovir AF (ODEFSEY) 200-25-25 MG TABS tablet Take 1 tablet by mouth daily with breakfast. 07/07/18   Cliffton Asters, MD  fluticasone [facility]) 50 MCG/ACT nasal spray Place 2 sprays into both nostrils daily. 04/26/18   Horton, Mayer Masker, MD  hydrochlorothiazide (HYDRODIURIL) 50 MG tablet Take 1 tablet (50 mg total) by mouth daily. 02/26/18   Sharon Seller, NP  lisinopril (PRINIVIL,ZESTRIL) 10 MG tablet TAKE 1 TABLET(10 MG) BY MOUTH DAILY 02/25/18   Sharon Seller, NP  naproxen (NAPROSYN) 500 MG tablet Take 1 tablet (500 mg total) by mouth 2 (two) times daily for 14 days. 07/08/18 07/22/18  Claude Manges, PA-C  ODEFSEY 200-25-25 MG TABS  tablet TAKE ONE TABLET BY MOUTH EVERY DAY 04/03/18   Cliffton Asters, MD  sodium chloride (OCEAN) 0.65 % SOLN nasal spray Place 1 spray into both nostrils as needed for congestion. 04/26/18   Horton, Mayer Masker, MD    Family History Family History  Problem Relation Age of Onset  . Other Unknown        No known heart disease    Social History Social History   Tobacco Use  . Smoking status: Current Every Day Smoker    Packs/day: 1.00    Years: 26.00    Pack years: 26.00    Types: Cigarettes  . Smokeless tobacco: Never Used  . Tobacco comment: states he is trying to cut back  Substance Use Topics  . Alcohol use: Yes    Alcohol/week: 5.0 standard drinks    Types: 5 Cans of beer per  week    Comment: 2 to 3 22oz beers per day; no liquor  . Drug use: Not Currently     Allergies   Genvoya [elviteg-cobic-emtricit-tenofaf]   Review of Systems Review of Systems  Constitutional: Negative for fever.  Genitourinary: Negative for dysuria.  Musculoskeletal: Positive for arthralgias. Negative for back pain.  All other systems reviewed and are negative.    Physical Exam Updated Vital Signs BP (!) 185/81 (BP Location: Left Arm)   Pulse 85   Temp 97.9 F (36.6 C) (Oral)   Resp 18   Ht 5\' 8"  (1.727 m)   Wt 90.7 kg   SpO2 100%   BMI 30.41 kg/m   Physical Exam  Constitutional: He is oriented to person, place, and time. He appears well-developed and well-nourished.  HENT:  Head: Normocephalic and atraumatic.  Neck: Normal range of motion. Neck supple.  Cardiovascular: Normal heart sounds.  Pulmonary/Chest: Breath sounds normal. He has no wheezes.  Abdominal: Soft. There is no tenderness.  Musculoskeletal: He exhibits tenderness.       Right knee: He exhibits swelling and effusion. He exhibits normal range of motion, no deformity, no laceration and no erythema.       Legs: Knee effusion noted, warmth to the area no erythema present.  Patient has full range of motion of leg, good strength with knee extension and knee flexion.  No calf tenderness.  Neurological: He is alert and oriented to person, place, and time.  Skin: Skin is warm and dry.  Nursing note and vitals reviewed.    ED Treatments / Results  Labs (all labs ordered are listed, but only abnormal results are displayed) Labs Reviewed - No data to display  EKG None  Radiology Dg Knee Complete 4 Views Right  Result Date: 07/08/2018 CLINICAL DATA:  Sudden onset of knee pain 4 days ago, suprapatellar. No known injury. History of gout. EXAM: RIGHT KNEE - COMPLETE 4+ VIEW COMPARISON:  None. FINDINGS: No joint space narrowing. The patient does appear to have a small knee joint effusion. No bone erosions  are seen. No other focal finding. IMPRESSION: Small knee joint effusion.  Otherwise negative radiographs. Electronically Signed   By: Paulina Fusi M.D.   On: 07/08/2018 15:03    Procedures Procedures (including critical care time)  Medications Ordered in ED Medications  naproxen (NAPROSYN) tablet 500 mg (has no administration in time range)     Initial Impression / Assessment and Plan / ED Course  I have reviewed the triage vital signs and the nursing notes.  Pertinent labs & imaging results that were available during my care of  the patient were reviewed by me and considered in my medical decision making (see chart for details).    Presents with right knee pain which began 4 days ago.  Denies any urinary symptoms, fever.  DG right knee shows small knee joint effusion.  Patient has been wearing a brace for therapy but states the pain is severe.  I have stated by patient with a referral to orthopedics who placed to follow-up with for his knee effusion.  Patient is requesting pain medication he also has a previous history of substance abuse per reviewing his chart at this time I will provide him with some NSAIDs to help relieve the swelling in his right knee, he is advised to follow-up with orthopedics this week and make an appointment.  At this time I am not concerned for septic joint as patient has been afebrile this week and has no urinary symptoms.  Patient is not exhibit weakness while walking he did precautions have been discussed at length with patient.   Final Clinical Impressions(s) / ED Diagnoses   Final diagnoses:  Acute pain of right knee  Effusion of right knee    ED Discharge Orders         Ordered    naproxen (NAPROSYN) 500 MG tablet  2 times daily     07/08/18 1638           Claude Manges, PA-C 07/08/18 1640    Raeford Razor, MD 07/13/18 [phone]

## 2018-07-08 NOTE — Discharge Instructions (Addendum)
I have prescribed medication for your knee swelling.Please follow up with orthopedics I have provided a referral for Dr. Norton Blizzard. Please return to the ED if you symptoms worsen, or you experience any fever.

## 2018-07-08 NOTE — ED Triage Notes (Signed)
Pt c/o right knee pain w/o injury x 4 days 

## 2018-07-09 ENCOUNTER — Other Ambulatory Visit: Payer: Self-pay | Admitting: *Deleted

## 2018-07-09 DIAGNOSIS — B20 Human immunodeficiency virus [HIV] disease: Secondary | ICD-10-CM

## 2018-07-09 MED ORDER — EMTRICITAB-RILPIVIR-TENOFOV AF 200-25-25 MG PO TABS: 1.0000 | ORAL_TABLET | Freq: Every day | ORAL | 5 refills | Status: DC

## 2018-07-31 ENCOUNTER — Encounter (HOSPITAL_BASED_OUTPATIENT_CLINIC_OR_DEPARTMENT_OTHER): Payer: Self-pay

## 2018-07-31 ENCOUNTER — Emergency Department (HOSPITAL_BASED_OUTPATIENT_CLINIC_OR_DEPARTMENT_OTHER)
Admission: EM | Admit: 2018-07-31 | Discharge: 2018-07-31 | Disposition: A | Discharge status: Home / Self Care | Payer: Self-pay | Attending: Emergency Medicine | Admitting: Emergency Medicine

## 2018-07-31 ENCOUNTER — Other Ambulatory Visit: Payer: Self-pay

## 2018-07-31 DIAGNOSIS — M25561 Pain in right knee: Secondary | ICD-10-CM | POA: Insufficient documentation

## 2018-07-31 DIAGNOSIS — F17210 Nicotine dependence, cigarettes, uncomplicated: Secondary | ICD-10-CM | POA: Insufficient documentation

## 2018-07-31 DIAGNOSIS — I10 Essential (primary) hypertension: Secondary | ICD-10-CM | POA: Insufficient documentation

## 2018-07-31 DIAGNOSIS — Z21 Asymptomatic human immunodeficiency virus [HIV] infection status: Secondary | ICD-10-CM | POA: Insufficient documentation

## 2018-07-31 HISTORY — DX: Gout, unspecified: M10.9

## 2018-07-31 MED ORDER — TRAMADOL HCL 50 MG PO TABS: 50.0000 mg | ORAL_TABLET | Freq: Four times a day (QID) | ORAL | 0 refills | Status: DC | PRN

## 2018-07-31 MED ORDER — NAPROXEN 500 MG PO TABS: 500.0000 mg | ORAL_TABLET | Freq: Two times a day (BID) | ORAL | 0 refills | Status: DC

## 2018-07-31 MED ORDER — KETOROLAC TROMETHAMINE 30 MG/ML IJ SOLN
30.0000 mg | Freq: Once | INTRAMUSCULAR | Status: AC
  Administered 2018-07-31: 30 mg via INTRAMUSCULAR
  Filled 2018-07-31: qty 1

## 2018-07-31 NOTE — Discharge Instructions (Signed)

## 2018-07-31 NOTE — ED Provider Notes (Signed)
Emergency Department Provider Note   I have reviewed the triage vital signs and the nursing notes.   HISTORY  Chief Complaint Knee Pain (Right)   HPI Brandon Quinn is a 52 y.o. male with PMH of HIV, HTN, HLD, and alcohol abuse resents to the emergency department for evaluation of right knee pain.  The patient was seen on 10/2 with similar pain.  He was discharged home with naproxen and a knee compression sleeve.  Symptoms have initially improved and then worsened over the past 2 days.  He denies any injury.  No fevers or chills.  No numbness or tingling in the leg.  He states today he tried to go to work but his knee was too painful and he was unable to work.  He is compliant with his HIV medications.   Past Medical History:  Diagnosis Date  . Chest pain    a. 07/2012 Lexiscan Sestamibi:  no ischemia/infarct, nl EF.  Marland Kitchen Gout   . HIV infection (HCC)   . Hyperlipidemia   . Hypertension   . Substance abuse (HCC)    alcohol   . Tobacco abuse     Patient Active Problem List   Diagnosis Date Noted  . Hypertensive emergency 06/02/2017  . Alcohol abuse 06/01/2017  . Pain, dental 07/29/2016  . Cough 07/29/2016  . Rash 06/23/2015  . HIV disease (HCC) 05/18/2015  . Acute gouty arthritis 01/30/2015  . Oral thrush 01/30/2015  . Tobacco abuse 07/30/2012  . Hypertension 07/29/2012    Past Surgical History:  Procedure Laterality Date  . WRIST FRACTURE SURGERY  ~ 2009   right; "put a plate in it" (16/07/9603)    Allergies Genvoya [elviteg-cobic-emtricit-tenofaf]  Family History  Problem Relation Age of Onset  . Other Unknown        No known heart disease    Social History Social History   Tobacco Use  . Smoking status: Current Every Day Smoker    Packs/day: 1.00    Years: 26.00    Pack years: 26.00    Types: Cigarettes  . Smokeless tobacco: Never Used  . Tobacco comment: states he is trying to cut back  Substance Use Topics  . Alcohol use: Yes    Alcohol/week:  5.0 standard drinks    Types: 5 Cans of beer per week    Comment: 2 to 3 22oz beers per day; no liquor  . Drug use: Not Currently    Review of Systems  Constitutional: No fever/chills Eyes: No visual changes. ENT: No sore throat. Cardiovascular: Denies chest pain. Respiratory: Denies shortness of breath. Gastrointestinal: No abdominal pain.  No nausea, no vomiting.  No diarrhea.  No constipation. Genitourinary: Negative for dysuria. Musculoskeletal: Positive right knee pain.  Skin: Negative for rash. Neurological: Negative for headaches, focal weakness or numbness.  10-point ROS otherwise negative.  ____________________________________________   PHYSICAL EXAM:  VITAL SIGNS: ED Triage Vitals  Enc Vitals Group     BP 07/31/18 0729 (!) 148/99     Pulse Rate 07/31/18 0729 99     Resp 07/31/18 0729 16     Temp 07/31/18 0729 98 F (36.7 C)     Temp Source 07/31/18 0729 Oral     SpO2 07/31/18 0729 99 %   Constitutional: Alert and oriented. Well appearing and in no acute distress. Eyes: Conjunctivae are normal.  Head: Atraumatic. Nose: No congestion/rhinnorhea. Mouth/Throat: Mucous membranes are moist.  Oropharynx non-erythematous. Neck: No stridor.  Cardiovascular: Normal rate, regular rhythm. Good peripheral  circulation. Grossly normal heart sounds.   Respiratory: Normal respiratory effort.  No retractions. Lungs CTAB. Gastrointestinal: Soft and nontender. No distention.  Musculoskeletal: No lower extremity edema. Tenderness to palpation over the suprapatellar region with mild swelling. No erythema or warmth. Normal ROM of the right knee. No patellar tenderness. No deformity.  Neurologic:  Normal speech and language. No gross focal neurologic deficits are appreciated.  Skin:  Skin is warm, dry and intact. No rash noted.  ____________________________________________  RADIOLOGY  None ____________________________________________   PROCEDURES  Procedure(s) performed:    Procedures  None ____________________________________________   INITIAL IMPRESSION / ASSESSMENT AND PLAN / ED COURSE  Pertinent labs & imaging results that were available during my care of the patient were reviewed by me and considered in my medical decision making (see chart for details).  Patient presents to the emergency department with right knee pain.  He has mostly tenderness near his quadriceps tendon with very mild joint effusion.  No clinical concern for septic arthritis or gout.  No bony tenderness.  Patient had an x-ray on 10/2 which was reviewed.  There is no new injury.  I do not feel additional imaging is warranted today.  Plan for Toradol here in the emergency department as well as NSAIDs for home. Will refer to Dr. Pearletha Forge for further evaluation.   At this time, I do not feel there is any life-threatening condition present. I have reviewed and discussed all results (EKG, imaging, lab, urine as appropriate), exam findings with patient. I have reviewed nursing notes and appropriate previous records.  I feel the patient is safe to be discharged home without further emergent workup. Discussed usual and customary return precautions. Patient and family (if present) verbalize understanding and are comfortable with this plan.  Patient will follow-up with their primary care provider. If they do not have a primary care provider, information for follow-up has been provided to them. All questions have been answered.  ____________________________________________  FINAL CLINICAL IMPRESSION(S) / ED DIAGNOSES  Final diagnoses:  Acute pain of right knee     MEDICATIONS GIVEN DURING THIS VISIT:  Medications  ketorolac (TORADOL) 30 MG/ML injection 30 mg (30 mg Intramuscular Given 07/31/18 0751)     NEW OUTPATIENT MEDICATIONS STARTED DURING THIS VISIT:  New Prescriptions   NAPROXEN (NAPROSYN) 500 MG TABLET    Take 1 tablet (500 mg total) by mouth 2 (two) times daily.   TRAMADOL  (ULTRAM) 50 MG TABLET    Take 1 tablet (50 mg total) by mouth every 6 (six) hours as needed.    Note:  This document was prepared using Dragon voice recognition software and may include unintentional dictation errors.  Alona Bene, MD Emergency Medicine    Binh Doten, Arlyss Repress, MD 07/31/18 [phone]

## 2018-07-31 NOTE — ED Triage Notes (Addendum)
Pt reports worsening right knee pain 10/10. He states "I had to leave work this morning."  Pt reports hx of Gout. Pt denies recent fall. Pt A+OX4.

## 2018-08-19 ENCOUNTER — Other Ambulatory Visit: Payer: Self-pay | Admitting: Nurse Practitioner

## 2018-08-19 DIAGNOSIS — I10 Essential (primary) hypertension: Secondary | ICD-10-CM

## 2018-08-20 ENCOUNTER — Other Ambulatory Visit: Payer: Self-pay | Admitting: Nurse Practitioner

## 2018-08-20 DIAGNOSIS — I10 Essential (primary) hypertension: Secondary | ICD-10-CM

## 2018-09-09 ENCOUNTER — Encounter (HOSPITAL_BASED_OUTPATIENT_CLINIC_OR_DEPARTMENT_OTHER): Payer: Self-pay | Admitting: Emergency Medicine

## 2018-09-09 ENCOUNTER — Other Ambulatory Visit: Payer: Self-pay

## 2018-09-09 ENCOUNTER — Emergency Department (HOSPITAL_BASED_OUTPATIENT_CLINIC_OR_DEPARTMENT_OTHER)
Admission: EM | Admit: 2018-09-09 | Discharge: 2018-09-09 | Disposition: A | Discharge status: Home / Self Care | Payer: Self-pay | Attending: Emergency Medicine | Admitting: Emergency Medicine

## 2018-09-09 DIAGNOSIS — F17210 Nicotine dependence, cigarettes, uncomplicated: Secondary | ICD-10-CM | POA: Insufficient documentation

## 2018-09-09 DIAGNOSIS — M1712 Unilateral primary osteoarthritis, left knee: Secondary | ICD-10-CM | POA: Insufficient documentation

## 2018-09-09 DIAGNOSIS — F1010 Alcohol abuse, uncomplicated: Secondary | ICD-10-CM | POA: Insufficient documentation

## 2018-09-09 DIAGNOSIS — Z21 Asymptomatic human immunodeficiency virus [HIV] infection status: Secondary | ICD-10-CM | POA: Insufficient documentation

## 2018-09-09 DIAGNOSIS — I10 Essential (primary) hypertension: Secondary | ICD-10-CM | POA: Insufficient documentation

## 2018-09-09 DIAGNOSIS — Z7982 Long term (current) use of aspirin: Secondary | ICD-10-CM | POA: Insufficient documentation

## 2018-09-09 DIAGNOSIS — Z79899 Other long term (current) drug therapy: Secondary | ICD-10-CM | POA: Insufficient documentation

## 2018-09-09 MED ORDER — CELECOXIB 200 MG PO CAPS: 200.0000 mg | ORAL_CAPSULE | Freq: Two times a day (BID) | ORAL | 0 refills | Status: DC

## 2018-09-09 MED ORDER — KETOROLAC TROMETHAMINE 30 MG/ML IJ SOLN
30.0000 mg | Freq: Once | INTRAMUSCULAR | Status: AC
  Administered 2018-09-09: 30 mg via INTRAMUSCULAR
  Filled 2018-09-09: qty 1

## 2018-09-09 MED ORDER — PREDNISONE 10 MG (21) PO TBPK: ORAL_TABLET | ORAL | 0 refills | Status: DC

## 2018-09-09 MED ORDER — DEXAMETHASONE SODIUM PHOSPHATE 10 MG/ML IJ SOLN
10.0000 mg | Freq: Once | INTRAMUSCULAR | Status: AC
  Administered 2018-09-09: 10 mg via INTRAMUSCULAR
  Filled 2018-09-09: qty 1

## 2018-09-09 NOTE — ED Triage Notes (Signed)
Reports left knee pain and swelling x 1 week without injury.  Reports history of the same.

## 2018-09-09 NOTE — ED Notes (Signed)
NAD at this time. Pt is stable and going home.  

## 2018-09-09 NOTE — ED Provider Notes (Signed)
[facility] EMERGENCY DEPARTMENT Provider Note   CSN: [id number] Arrival date & time: 09/09/18  4098     History   Chief Complaint Chief Complaint  Patient presents with  . Knee Pain    HPI Brandon Quinn is a 52 y.o. male.  Pt presents to the ED today with left knee pain and swelling.  Pt has been here in the past for right knee pain which has felt similar.  He said his joints go back and forth hurting.  He has not seen an orthopedist for his knee pain, despite being told in the past to f/u.  The pt has a hx of HIV, but has been compliant with his meds.  He said his last viral load was undetectable.  He has not had any fever or redness to the knee.  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     Past Medical History:  Diagnosis Date  . Chest pain    a. 07/2012 Lexiscan Sestamibi:  no ischemia/infarct, nl EF.  Marland Kitchen Gout   . HIV infection (HCC)   . Hyperlipidemia   . Hypertension   . Substance abuse (HCC)    alcohol   . Tobacco abuse     Patient Active Problem List   Diagnosis Date Noted  . Hypertensive emergency 06/02/2017  . Alcohol abuse 06/01/2017  . Pain, dental 07/29/2016  . Cough 07/29/2016  . Rash 06/23/2015  . HIV disease (HCC) 05/18/2015  . Acute gouty arthritis 01/30/2015  . Oral thrush 01/30/2015  . Tobacco abuse 07/30/2012  . Hypertension 07/29/2012    Past Surgical History:  Procedure Laterality Date  . WRIST FRACTURE SURGERY  ~ 2009   right; "put a plate in it" (11/91/4782)        Home Medications    Prior to Admission medications   Medication Sig Start Date End Date Taking? Authorizing Provider  acetaminophen-codeine (TYLENOL #3) 300-30 MG tablet Take 1-2 tablets by mouth every 6 (six) hours as needed for moderate pain. 05/04/18   Cliffton Asters, MD  albuterol (PROVENTIL HFA;VENTOLIN HFA) 108 [phone] Base) MCG/ACT inhaler Inhale 2 puffs into the lungs every 4 (four) hours as needed for wheezing or shortness of breath. 04/26/18    Horton, Mayer Masker, MD  allopurinol (ZYLOPRIM) 100 MG tablet Take 1 tablet (100 mg total) by mouth daily. 02/13/18   Sharon Seller, NP  aspirin EC 81 MG tablet Take 81 mg by mouth daily.    [provider]  atorvastatin (LIPITOR) 80 MG tablet 1/2 Tablet by mouth daily x 1 week then increase to 1 tablet daily 03/03/18   Sharon Seller, NP  celecoxib (CELEBREX) 200 MG capsule Take 1 capsule (200 mg total) by mouth 2 (two) times daily. 09/09/18   Jacalyn Lefevre, MD  colchicine 0.6 MG tablet Take this one pill 1 hour after your first dose given in the ED Patient not taking: Reported on 05/04/2018 02/13/18   Melene Plan, DO  emtricitabine-rilpivir-tenofovir AF (ODEFSEY) 200-25-25 MG TABS tablet Take 1 tablet by mouth daily. 07/09/18   Cliffton Asters, MD  fluticasone [facility]) 50 MCG/ACT nasal spray Place 2 sprays into both nostrils daily. 04/26/18   Horton, Mayer Masker, MD  hydrochlorothiazide (HYDRODIURIL) 50 MG tablet TAKE 1 TABLET(50 MG) BY MOUTH DAILY 08/20/18   Sharon Seller, NP  lisinopril (PRINIVIL,ZESTRIL) 10 MG tablet TAKE 1 TABLET(10 MG) BY MOUTH DAILY 08/20/18   Sharon Seller, NP  naproxen (NAPROSYN)  500 MG tablet Take 1 tablet (500 mg total) by mouth 2 (two) times daily. 07/31/18   Long, Arlyss RepressJoshua G, MD  predniSONE (STERAPRED UNI-PAK 21 TAB) 10 MG (21) TBPK tablet Take 6 tabs for 2 days, then 5 for 2 days, then 4 for 2 days, then 3 for 2 days, 2 for 2 days, then 1 for 2 days 09/09/18   Jacalyn LefevreHaviland, Shakeia Krus, MD  sodium chloride (OCEAN) 0.65 % SOLN nasal spray Place 1 spray into both nostrils as needed for congestion. 04/26/18   Horton, Mayer Maskerourtney F, MD  traMADol (ULTRAM) 50 MG tablet Take 1 tablet (50 mg total) by mouth every 6 (six) hours as needed. 07/31/18   Long, Arlyss RepressJoshua G, MD    Family History Family History  Problem Relation Age of Onset  . Other Unknown        No known heart disease    Social History Social History   Tobacco Use  . Smoking status: Current Every Day  Smoker    Packs/day: 1.00    Years: 26.00    Pack years: 26.00    Types: Cigarettes  . Smokeless tobacco: Never Used  . Tobacco comment: states he is trying to cut back  Substance Use Topics  . Alcohol use: Yes    Alcohol/week: 5.0 standard drinks    Types: 5 Cans of beer per week    Comment: 2 to 3 22oz beers per day; no liquor  . Drug use: Not Currently     Allergies   Genvoya [elviteg-cobic-emtricit-tenofaf]   Review of Systems Review of Systems  Musculoskeletal:       Left knee pain  All other systems reviewed and are negative.    Physical Exam Updated Vital Signs BP (!) 185/86   Pulse 95   Temp 98.2 F (36.8 C) (Oral)   Resp 20   Ht 5\' 6"  (1.676 m)   Wt 90.7 kg   SpO2 100%   BMI 32.28 kg/m   Physical Exam  Constitutional: He is oriented to person, place, and time. He appears well-developed and well-nourished.  HENT:  Head: Normocephalic and atraumatic.  Right Ear: External ear normal.  Left Ear: External ear normal.  Nose: Nose normal.  Mouth/Throat: Oropharynx is clear and moist.  Eyes: Pupils are equal, round, and reactive to light. Conjunctivae and EOM are normal.  Neck: Normal range of motion. Neck supple.  Cardiovascular: Normal rate, regular rhythm, normal heart sounds and intact distal pulses.  Pulmonary/Chest: Effort normal and breath sounds normal.  Abdominal: Soft. Bowel sounds are normal.  Musculoskeletal:       Left knee: He exhibits swelling. He exhibits normal range of motion. Tenderness found.  Neurological: He is alert and oriented to person, place, and time.  Skin: Skin is warm. Capillary refill takes less than 2 seconds.  Psychiatric: He has a normal mood and affect. His behavior is normal. Judgment and thought content normal.  Nursing note and vitals reviewed.    ED Treatments / Results  Labs (all labs ordered are listed, but only abnormal results are displayed) Labs Reviewed - No data to display  EKG None  Radiology No  results found.  Procedures Procedures (including critical care time)  Medications Ordered in ED Medications  dexamethasone (DECADRON) injection 10 mg (has no administration in time range)  ketorolac (TORADOL) 30 MG/ML injection 30 mg (has no administration in time range)     Initial Impression / Assessment and Plan / ED Course  I have reviewed the triage  vital signs and the nursing notes.  Pertinent labs & imaging results that were available during my care of the patient were reviewed by me and considered in my medical decision making (see chart for details).     Pt likely has arthritis in his knee.  He is instructed to f/u with sports medicine Dr. Pearletha Forge.  He is given decadron and toradol prior to d/c.  He will be d/c home on celebrex and a prednisone taper.  He knows to return if worse.  Final Clinical Impressions(s) / ED Diagnoses   Final diagnoses:  Osteoarthritis of left knee, unspecified osteoarthritis type    ED Discharge Orders         Ordered    celecoxib (CELEBREX) 200 MG capsule  2 times daily     09/09/18 0914    predniSONE (STERAPRED UNI-PAK 21 TAB) 10 MG (21) TBPK tablet     09/09/18 1610           Jacalyn Lefevre, MD 09/09/18 [phone]

## 2018-09-27 ENCOUNTER — Other Ambulatory Visit: Payer: Self-pay | Admitting: Nurse Practitioner

## 2018-09-27 DIAGNOSIS — I10 Essential (primary) hypertension: Secondary | ICD-10-CM

## 2018-10-09 ENCOUNTER — Ambulatory Visit: Payer: BLUE CROSS/BLUE SHIELD | Admitting: Nurse Practitioner

## 2018-10-14 ENCOUNTER — Ambulatory Visit: Payer: BLUE CROSS/BLUE SHIELD | Admitting: Nurse Practitioner

## 2018-10-22 ENCOUNTER — Encounter: Payer: Self-pay | Admitting: Family

## 2018-10-22 ENCOUNTER — Encounter (HOSPITAL_BASED_OUTPATIENT_CLINIC_OR_DEPARTMENT_OTHER): Payer: Self-pay

## 2018-10-22 ENCOUNTER — Encounter (HOSPITAL_COMMUNITY): Payer: Self-pay | Admitting: Emergency Medicine

## 2018-10-22 ENCOUNTER — Emergency Department (HOSPITAL_BASED_OUTPATIENT_CLINIC_OR_DEPARTMENT_OTHER)
Admission: EM | Admit: 2018-10-22 | Discharge: 2018-10-22 | Disposition: A | Discharge status: Home / Self Care | Payer: BLUE CROSS/BLUE SHIELD | Source: Home / Self Care | Attending: Emergency Medicine | Admitting: Emergency Medicine

## 2018-10-22 ENCOUNTER — Other Ambulatory Visit: Payer: Self-pay

## 2018-10-22 ENCOUNTER — Ambulatory Visit (INDEPENDENT_AMBULATORY_CARE_PROVIDER_SITE_OTHER): Payer: BLUE CROSS/BLUE SHIELD | Admitting: Family

## 2018-10-22 ENCOUNTER — Encounter: Payer: Self-pay | Admitting: *Deleted

## 2018-10-22 ENCOUNTER — Emergency Department (HOSPITAL_COMMUNITY)
Admission: EM | Admit: 2018-10-22 | Discharge: 2018-10-22 | Disposition: A | Discharge status: Home / Self Care | Payer: BLUE CROSS/BLUE SHIELD | Attending: Emergency Medicine | Admitting: Emergency Medicine

## 2018-10-22 VITALS — BP 180/80 | HR 93 | Temp 97.8°F | Ht 72.0 in | Wt 199.0 lb

## 2018-10-22 DIAGNOSIS — F17200 Nicotine dependence, unspecified, uncomplicated: Secondary | ICD-10-CM | POA: Diagnosis not present

## 2018-10-22 DIAGNOSIS — F17210 Nicotine dependence, cigarettes, uncomplicated: Secondary | ICD-10-CM

## 2018-10-22 DIAGNOSIS — Z79899 Other long term (current) drug therapy: Secondary | ICD-10-CM | POA: Insufficient documentation

## 2018-10-22 DIAGNOSIS — M25539 Pain in unspecified wrist: Secondary | ICD-10-CM | POA: Diagnosis not present

## 2018-10-22 DIAGNOSIS — I10 Essential (primary) hypertension: Secondary | ICD-10-CM | POA: Insufficient documentation

## 2018-10-22 DIAGNOSIS — M109 Gout, unspecified: Secondary | ICD-10-CM | POA: Insufficient documentation

## 2018-10-22 DIAGNOSIS — E785 Hyperlipidemia, unspecified: Secondary | ICD-10-CM | POA: Diagnosis not present

## 2018-10-22 DIAGNOSIS — M10032 Idiopathic gout, left wrist: Secondary | ICD-10-CM

## 2018-10-22 DIAGNOSIS — Z7982 Long term (current) use of aspirin: Secondary | ICD-10-CM | POA: Insufficient documentation

## 2018-10-22 DIAGNOSIS — B20 Human immunodeficiency virus [HIV] disease: Secondary | ICD-10-CM

## 2018-10-22 DIAGNOSIS — F1010 Alcohol abuse, uncomplicated: Secondary | ICD-10-CM | POA: Diagnosis not present

## 2018-10-22 MED ORDER — COLCHICINE 0.6 MG PO TABS: ORAL_TABLET | ORAL | 0 refills | Status: DC

## 2018-10-22 MED ORDER — KETOROLAC TROMETHAMINE 30 MG/ML IJ SOLN
30.0000 mg | Freq: Once | INTRAMUSCULAR | Status: AC
  Administered 2018-10-22: 30 mg via INTRAMUSCULAR
  Filled 2018-10-22: qty 1

## 2018-10-22 NOTE — ED Provider Notes (Addendum)
[facility] Provider Note: Brandon DellJ. Lane Ertha Nabor, MD, FACEP  CSN: [id number] MRN: [id number] ARRIVAL: 10/22/18 at 0226 ROOM: [facility]06/[facility]06   CHIEF COMPLAINT  Wrist Pain   HISTORY OF PRESENT ILLNESS  10/22/18 2:36 AM Brandon Quinn is a 53 y.o. male with a history of HIV, alcoholism and gout.  He is here with a 2-day history of severe pain in his left wrist with associated swelling.  Pain is worse with palpation or movement.  Pain is similar to previous episodes of gout.  He has taken ibuprofen and Tylenol without relief.   Past Medical History:  Diagnosis Date  . Gout   . HIV infection (HCC)   . Hyperlipidemia   . Hypertension   . Substance abuse (HCC)    alcohol   . Tobacco abuse     Past Surgical History:  Procedure Laterality Date  . WRIST FRACTURE SURGERY  ~ 2009   right; "put a plate in it" (08/65/784610/24/2013)    Family History  Problem Relation Age of Onset  . Other Other        No known heart disease    Social History   Tobacco Use  . Smoking status: Current Every Day Smoker    Packs/day: 1.00    Years: 26.00    Pack years: 26.00    Types: Cigarettes  . Smokeless tobacco: Never Used  . Tobacco comment: states he is trying to cut back  Substance Use Topics  . Alcohol use: Yes    Alcohol/week: 5.0 standard drinks    Types: 5 Cans of beer per week    Comment: 2 to 3 22oz beers per day; no liquor  . Drug use: Not Currently    Prior to Admission medications   Medication Sig Start Date End Date Taking? Authorizing Provider  acetaminophen-codeine (TYLENOL #3) 300-30 MG tablet Take 1-2 tablets by mouth every 6 (six) hours as needed for moderate pain. 05/04/18   Cliffton Astersampbell, Darline Faith, MD  albuterol (PROVENTIL HFA;VENTOLIN HFA) 108 [phone](90 Base) MCG/ACT inhaler Inhale 2 puffs into the lungs every 4 (four) hours as needed for wheezing or shortness of breath. 04/26/18   Horton, Mayer Maskerourtney F, MD  allopurinol (ZYLOPRIM) 100 MG tablet Take 1 tablet (100 mg total) by mouth daily.  02/13/18   Sharon SellerEubanks, Jessica K, NP  aspirin EC 81 MG tablet Take 81 mg by mouth daily.    [provider]  atorvastatin (LIPITOR) 80 MG tablet 1/2 Tablet by mouth daily x 1 week then increase to 1 tablet daily 03/03/18   Sharon SellerEubanks, Jessica K, NP  celecoxib (CELEBREX) 200 MG capsule Take 1 capsule (200 mg total) by mouth 2 (two) times daily. 09/09/18   Jacalyn LefevreHaviland, Julie, MD  colchicine 0.6 MG tablet Take this one pill 1 hour after your first dose given in the ED Patient not taking: Reported on 05/04/2018 02/13/18   Melene PlanFloyd, Dan, DO  emtricitabine-rilpivir-tenofovir AF (ODEFSEY) 200-25-25 MG TABS tablet Take 1 tablet by mouth daily. 07/09/18   Cliffton Astersampbell, Kaedynce Tapp, MD  fluticasone [facility](FLONASE) 50 MCG/ACT nasal spray Place 2 sprays into both nostrils daily. 04/26/18   Horton, Mayer Maskerourtney F, MD  hydrochlorothiazide (HYDRODIURIL) 50 MG tablet TAKE 1 TABLET(50 MG) BY MOUTH DAILY 08/20/18   Sharon SellerEubanks, Jessica K, NP  lisinopril (PRINIVIL,ZESTRIL) 10 MG tablet TAKE 1 TABLET BY MOUTH DAILY 09/28/18   Sharon SellerEubanks, Jessica K, NP  naproxen (NAPROSYN) 500 MG tablet Take 1 tablet (500 mg total) by mouth 2 (two) times daily. 07/31/18   Long, Arlyss RepressJoshua G, MD  predniSONE (STERAPRED UNI-PAK 21 TAB) 10 MG (21) TBPK tablet Take 6 tabs for 2 days, then 5 for 2 days, then 4 for 2 days, then 3 for 2 days, 2 for 2 days, then 1 for 2 days 09/09/18   Jacalyn LefevreHaviland, Julie, MD  sodium chloride (OCEAN) 0.65 % SOLN nasal spray Place 1 spray into both nostrils as needed for congestion. 04/26/18   Horton, Mayer Maskerourtney F, MD  traMADol (ULTRAM) 50 MG tablet Take 1 tablet (50 mg total) by mouth every 6 (six) hours as needed. 07/31/18   Long, Arlyss RepressJoshua G, MD    Allergies Darrin LuisGenvoya [elviteg-cobic-emtricit-tenofaf]   REVIEW OF SYSTEMS  Negative except as noted here or in the History of Present Illness.   PHYSICAL EXAMINATION  Initial Vital Signs Blood pressure (!) 168/89, pulse 79, temperature 98.6 F (37 C), temperature source Oral, resp. rate 16, height 5\' 10"  (1.778  m), weight 90.7 kg, SpO2 100 %.  Examination General: Well-developed, well-nourished male in no acute distress; appearance consistent with age of record HENT: normocephalic; atraumatic; breath smells of alcohol Eyes: pupils equal, round and reactive to light; extraocular muscles intact Neck: supple Heart: regular rate and rhythm Lungs: clear to auscultation bilaterally Abdomen: soft; nondistended; nontender; bowel sounds present Extremities: No deformity; tenderness and swelling of left wrist with mild warmth but no erythema, left hand distally neurovascularly intact with intact tendon function Neurologic: Awake, alert and oriented; motor function intact in all extremities and symmetric; no facial droop Skin: Warm and dry Psychiatric: Flat affect   RESULTS  Summary of this visit's results, reviewed by myself:   EKG Interpretation  Date/Time:    Ventricular Rate:    PR Interval:    QRS Duration:   QT Interval:    QTC Calculation:   R Axis:     Text Interpretation:        Laboratory Studies: No results found for this or any previous visit (from the past 24 hour(s)). Imaging Studies: No results found.  ED COURSE and MDM  Nursing notes and initial vitals signs, including pulse oximetry, reviewed.  Vitals:   10/22/18 0233  BP: (!) 168/89  Pulse: 79  Resp: 16  Temp: 98.6 F (37 C)  TempSrc: Oral  SpO2: 100%  Weight: 90.7 kg  Height: 5\' 10"  (1.778 m)   Examination consistent with gout.  Patient has a history of similar episodes in the past.  Will avoid narcotics given his history of alcohol abuse.  He has an appointment with his PCP later today.  PROCEDURES    ED DIAGNOSES     ICD-10-CM   1. Acute gout of left wrist, unspecified cause M10.9        [name], MD 10/22/18 0243    [name], [name], MD 10/22/18 [id number]

## 2018-10-22 NOTE — ED Triage Notes (Signed)
Left wrist pain and swelling for two days, no injury, not relieved with OTC medication

## 2018-10-22 NOTE — Patient Instructions (Signed)
1. Please take your allopurinol once you complete your colchine as ordered in the ER to prevent recurrence of gout.  2. Start taking your atorvastatin 80 mg tablet as ordered.  3. Check blood pressure at least three times per week and notify provider if blood pressure > 160/90.   4. Drink 6-8 glasses of water daily   5. Smoking and alcohol cessation  6. May return to work 10/24/2017  Smoking Tobacco Information, Adult Smoking tobacco can be harmful to your health. Tobacco contains a poisonous (toxic), colorless chemical called nicotine. Nicotine is addictive. It changes the brain and can make it hard to stop smoking. Tobacco also has other toxic chemicals that can hurt your body and raise your risk of many cancers. How can smoking tobacco affect me? Smoking tobacco puts you at risk for:  Cancer. Smoking is most commonly associated with lung cancer, but can also lead to cancer in other parts of the body.  Chronic obstructive pulmonary disease (COPD). This is a long-term lung condition that makes it hard to breathe. It also gets worse over time.  High blood pressure (hypertension), heart disease, stroke, or heart attack.  Lung infections, such as pneumonia.  Cataracts. This is when the lenses in the eyes become clouded.  Digestive problems. This may include peptic ulcers, heartburn, and gastroesophageal reflux disease (GERD).  Oral health problems, such as gum disease and tooth loss.  Loss of taste and smell. Smoking can affect your appearance by causing:  Wrinkles.  Yellow or stained teeth, fingers, and fingernails. Smoking tobacco can also affect your social life, because:  It may be challenging to find places to smoke when away from home. Many workplaces, [employer], hotels, and public places are tobacco-free.  Smoking is expensive. This is due to the cost of tobacco and the long-term costs of treating health problems from smoking.  Secondhand smoke may affect those around  you. Secondhand smoke can cause lung cancer, breathing problems, and heart disease. Children of smokers have a higher risk for: ? Sudden infant death syndrome (SIDS). ? Ear infections. ? Lung infections. If you currently smoke tobacco, quitting now can help you:  Lead a longer and healthier life.  Look, smell, breathe, and feel better over time.  Save money.  Protect others from the harms of secondhand smoke. What actions can I take to prevent health problems? Quit smoking   Do not start smoking. Quit if you already do.  Make a plan to quit smoking and commit to it. Look for programs to help you and ask your health care provider for recommendations and ideas.  Set a date and write down all the reasons you want to quit.  Let your friends and family know you are quitting so they can help and support you. Consider finding friends who also want to quit. It can be easier to quit with someone else, so that you can support each other.  Talk with your health care provider about using nicotine replacement medicines to help you quit, such as gum, lozenges, patches, sprays, or pills.  Do not replace cigarette smoking with electronic cigarettes, which are commonly called e-cigarettes. The safety of e-cigarettes is not known, and some may contain harmful chemicals.  If you try to quit but return to smoking, stay positive. It is common to slip up when you first quit, so take it one day at a time.  Be prepared for cravings. When you feel the urge to smoke, chew gum or suck on hard candy.  Lifestyle  Stay busy and take care of your body.  Drink enough fluid to keep your urine pale yellow.  Get plenty of exercise and eat a healthy diet. This can help prevent weight gain after quitting.  Monitor your eating habits. Quitting smoking can cause you to have a larger appetite than when you smoke.  Find ways to relax. Go out with friends or family to a movie or a restaurant where people do not  smoke.  Ask your health care provider about having regular tests (screenings) to check for cancer. This may include blood tests, imaging tests, and other tests.  Find ways to manage your stress, such as meditation, yoga, or exercise. Where to find support To get support to quit smoking, consider:  Asking your health care provider for more information and resources.  Taking classes to learn more about quitting smoking.  Looking for local organizations that offer resources about quitting smoking.  Joining a support group for people who want to quit smoking in your local community.  Calling the smokefree.gov counselor helpline: 1-800-Quit-Now (716) 266-8713) Where to find more information You may find more information about quitting smoking from:  HelpGuide.org: www.helpguide.org  [employer]: smokefree.gov  American Lung Association: www.lung.org Contact a health care provider if you:  Have problems breathing.  Notice that your lips, nose, or fingers turn blue.  Have chest pain.  Are coughing up blood.  Feel faint or you pass out.  Have other health changes that cause you to worry. Summary  Smoking tobacco can negatively affect your health, the health of those around you, your finances, and your social life.  Do not start smoking. Quit if you already do. If you need help quitting, ask your health care provider.  Think about joining a support group for people who want to quit smoking in your local community. There are many effective programs that will help you to quit this behavior. This information is not intended to replace advice given to you by your health care provider. Make sure you discuss any questions you have with your health care provider. Document Released: 10/08/2016 Document Revised: 11/12/2017 Document Reviewed: 10/08/2016 Elsevier Interactive Patient Education  2019 [employer].    Hypertension Hypertension, commonly called high blood pressure, is  when the force of blood pumping through the arteries is too strong. The arteries are the blood vessels that carry blood from the heart throughout the body. Hypertension forces the heart to work harder to pump blood and may cause arteries to become narrow or stiff. Having untreated or uncontrolled hypertension can cause heart attacks, strokes, kidney disease, and other problems. A blood pressure reading consists of a higher number over a lower number. Ideally, your blood pressure should be below 120/80. The first ("top") number is called the systolic pressure. It is a measure of the pressure in your arteries as your heart beats. The second ("bottom") number is called the diastolic pressure. It is a measure of the pressure in your arteries as the heart relaxes. What are the causes? The cause of this condition is not known. What increases the risk? Some risk factors for high blood pressure are under your control. Others are not. Factors you can change  Smoking.  Having type 2 diabetes mellitus, high cholesterol, or both.  Not getting enough exercise or physical activity.  Being overweight.  Having too much fat, sugar, calories, or salt (sodium) in your diet.  Drinking too much alcohol. Factors that are difficult or impossible to change  Having chronic  kidney disease.  Having a family history of high blood pressure.  Age. Risk increases with age.  Race. You may be at higher risk if you are African-American.  Gender. Men are at higher risk than women before age 45. After age 9, women are at higher risk than men.  Having obstructive sleep apnea.  Stress. What are the signs or symptoms? Extremely high blood pressure (hypertensive crisis) may cause:  Headache.  Anxiety.  Shortness of breath.  Nosebleed.  Nausea and vomiting.  Severe chest pain.  Jerky movements you cannot control (seizures). How is this diagnosed? This condition is diagnosed by measuring your blood  pressure while you are seated, with your arm resting on a surface. The cuff of the blood pressure monitor will be placed directly against the skin of your upper arm at the level of your heart. It should be measured at least twice using the same arm. Certain conditions can cause a difference in blood pressure between your right and left arms. Certain factors can cause blood pressure readings to be lower or higher than normal (elevated) for a short period of time:  When your blood pressure is higher when you are in a health care provider's office than when you are at home, this is called white coat hypertension. Most people with this condition do not need medicines.  When your blood pressure is higher at home than when you are in a health care provider's office, this is called masked hypertension. Most people with this condition may need medicines to control blood pressure. If you have a high blood pressure reading during one visit or you have normal blood pressure with other risk factors:  You may be asked to return on a different day to have your blood pressure checked again.  You may be asked to monitor your blood pressure at home for 1 week or longer. If you are diagnosed with hypertension, you may have other blood or imaging tests to help your health care provider understand your overall risk for other conditions. How is this treated? This condition is treated by making healthy lifestyle changes, such as eating healthy foods, exercising more, and reducing your alcohol intake. Your health care provider may prescribe medicine if lifestyle changes are not enough to get your blood pressure under control, and if:  Your systolic blood pressure is above 130.  Your diastolic blood pressure is above 80. Your personal target blood pressure may vary depending on your medical conditions, your age, and other factors. Follow these instructions at home: Eating and drinking   Eat a diet that is high in  fiber and potassium, and low in sodium, added sugar, and fat. An example eating plan is called the DASH (Dietary Approaches to Stop Hypertension) diet. To eat this way: ? Eat plenty of fresh fruits and vegetables. Try to fill half of your plate at each meal with fruits and vegetables. ? Eat whole grains, such as whole wheat pasta, brown rice, or whole grain bread. Fill about one quarter of your plate with whole grains. ? Eat or drink low-fat dairy products, such as skim milk or low-fat yogurt. ? Avoid fatty cuts of meat, processed or cured meats, and poultry with skin. Fill about one quarter of your plate with lean proteins, such as fish, chicken without skin, beans, eggs, and tofu. ? Avoid premade and processed foods. These tend to be higher in sodium, added sugar, and fat.  Reduce your daily sodium intake. Most people with hypertension should eat  less than 1,500 mg of sodium a day.  Limit alcohol intake to no more than 1 drink a day for nonpregnant women and 2 drinks a day for men. One drink equals 12 oz of beer, 5 oz of wine, or 1 oz of hard liquor. Lifestyle   Work with your health care provider to maintain a healthy body weight or to lose weight. Ask what an ideal weight is for you.  Get at least 30 minutes of exercise that causes your heart to beat faster (aerobic exercise) most days of the week. Activities may include walking, swimming, or biking.  Include exercise to strengthen your muscles (resistance exercise), such as pilates or lifting weights, as part of your weekly exercise routine. Try to do these types of exercises for 30 minutes at least 3 days a week.  Do not use any products that contain nicotine or tobacco, such as cigarettes and e-cigarettes. If you need help quitting, ask your health care provider.  Monitor your blood pressure at home as told by your health care provider.  Keep all follow-up visits as told by your health care provider. This is  important. Medicines  Take over-the-counter and prescription medicines only as told by your health care provider. Follow directions carefully. Blood pressure medicines must be taken as prescribed.  Do not skip doses of blood pressure medicine. Doing this puts you at risk for problems and can make the medicine less effective.  Ask your health care provider about side effects or reactions to medicines that you should watch for. Contact a health care provider if:  You think you are having a reaction to a medicine you are taking.  You have headaches that keep coming back (recurring).  You feel dizzy.  You have swelling in your ankles.  You have trouble with your vision. Get help right away if:  You develop a severe headache or confusion.  You have unusual weakness or numbness.  You feel faint.  You have severe pain in your chest or abdomen.  You vomit repeatedly.  You have trouble breathing. Summary  Hypertension is when the force of blood pumping through your arteries is too strong. If this condition is not controlled, it may put you at risk for serious complications.  Your personal target blood pressure may vary depending on your medical conditions, your age, and other factors. For most people, a normal blood pressure is less than 120/80.  Hypertension is treated with lifestyle changes, medicines, or a combination of both. Lifestyle changes include weight loss, eating a healthy, low-sodium diet, exercising more, and limiting alcohol. This information is not intended to replace advice given to you by your health care provider. Make sure you discuss any questions you have with your health care provider. Document Released: 09/23/2005 Document Revised: 08/21/2016 Document Reviewed: 08/21/2016 Elsevier Interactive Patient Education  2019 [employer]

## 2018-10-22 NOTE — ED Notes (Signed)
Pt states he going to the hospital on [address]. Left AMA

## 2018-10-22 NOTE — Progress Notes (Signed)
Provider: Monterio Bob FNP-C  Sharon SellerEubanks, Jessica K, NP  Patient Care Team: Sharon SellerEubanks, Jessica K, NP as PCP - General (Geriatric Medicine) Cliffton Astersampbell, John, MD as Consulting Physician (Infectious Diseases)  Extended Emergency Contact Information Primary Emergency Contact: Salifou,Garba Address: [address]          [place], [place] [id number] [place] Mobile Phone: [phone] Relation: Brother Secondary Emergency Contact: Kone,Aidriss  [place] Mobile Phone: [phone] Relation: Friend   Goals of care: Advanced Directive information Advanced Directives 10/22/2018  Does Patient Have a Medical Advance Directive? No  Would patient like information on creating a medical advance directive? -     Chief Complaint  Patient presents with  . Medical Management of Chronic Issues    discuss blood pressure    HPI:  Pt is a 53 y.o. male seen today for an acute visit to discussed blood pressure.He would like his lisinopril discontinued due to dry mouth since starting on medication though admits to not drinking water.B/p elevated this visit and ER visit though complains of left wrist gout pain.He was seen today in the ER for left wrist gout colchicine 0.6 mg tablet take 2 tablets x 1 dose then take one tablet after one hour may repeat x 3 days.He will be picking up colchine later today after the visit from pharmacy.He has allopurinol on his medication list but states does not take it. Also states does not take atorvastatin just want to take blood pressure medication. I've discussed with patient the importance of take medication as directed due to his high risk of cardiovascular event given that he smokes cigarettes,hx HTN and drinks alcohol. He verbalized understanding states will resume his atorvastatin.He will also resume his allopurinol upon completing colchicine to prevent recurrence of gout.    Past Medical History:  Diagnosis Date  . Gout   . HIV  infection (HCC)   . Hyperlipidemia   . Hypertension   . Substance abuse (HCC)    alcohol   . Tobacco abuse    Past Surgical History:  Procedure Laterality Date  . WRIST FRACTURE SURGERY  ~ 2009   right; "put a plate in it" (24/40/102710/24/2013)    Allergies  Allergen Reactions  . Genvoya [Elviteg-Cobic-Emtricit-Tenofaf] Rash    Outpatient Encounter Medications as of 10/22/2018  Medication Sig  . aspirin EC 81 MG tablet Take 81 mg by mouth daily.  . celecoxib (CELEBREX) 200 MG capsule Take 1 capsule (200 mg total) by mouth 2 (two) times daily.  . colchicine 0.6 MG tablet Take 2 tablets now then a third tablet 1 hour later.  May repeat every 3 days until gout flare subsides.  Marland Kitchen. emtricitabine-rilpivir-tenofovir AF (ODEFSEY) 200-25-25 MG TABS tablet Take 1 tablet by mouth daily.  . hydrochlorothiazide (HYDRODIURIL) 50 MG tablet TAKE 1 TABLET(50 MG) BY MOUTH DAILY  . lisinopril (PRINIVIL,ZESTRIL) 10 MG tablet TAKE 1 TABLET BY MOUTH DAILY  . naproxen (NAPROSYN) 500 MG tablet Take 1 tablet (500 mg total) by mouth 2 (two) times daily.  Marland Kitchen. albuterol (PROVENTIL HFA;VENTOLIN HFA) 108 (90 Base) MCG/ACT inhaler Inhale 2 puffs into the lungs every 4 (four) hours as needed for wheezing or shortness of breath. (Patient not taking: Reported on 10/22/2018)  . allopurinol (ZYLOPRIM) 100 MG tablet Take 1 tablet (100 mg total) by mouth daily. (Patient not taking: Reported on 10/22/2018)  . atorvastatin (LIPITOR) 80 MG tablet 1/2 Tablet by mouth daily x 1 week then increase to 1 tablet daily (Patient  not taking: Reported on 10/22/2018)  . fluticasone (FLONASE) 50 MCG/ACT nasal spray Place 2 sprays into both nostrils daily. (Patient not taking: Reported on 10/22/2018)  . sodium chloride (OCEAN) 0.65 % SOLN nasal spray Place 1 spray into both nostrils as needed for congestion. (Patient not taking: Reported on 10/22/2018)   No facility-administered encounter medications on file as of 10/22/2018.     Review of Systems    Constitutional: Negative for appetite change, chills, fatigue and fever.  HENT: Negative for congestion, rhinorrhea, sinus pressure, sinus pain, sneezing and sore throat.   Eyes: Negative for pain, discharge, itching and visual disturbance.  Respiratory: Negative for cough, chest tightness, shortness of breath and wheezing.   Cardiovascular: Negative for chest pain, palpitations and leg swelling.  Gastrointestinal: Negative for abdominal distention, abdominal pain, constipation, diarrhea, nausea and vomiting.  Genitourinary: Negative for dysuria, flank pain and urgency.  Musculoskeletal:       Left wrist pain seen in ER for gout.awaiting to pick up medication from pharmacy.   Skin: Negative for color change, pallor and rash.  Neurological: Negative for dizziness, light-headedness and headaches.  Psychiatric/Behavioral: Positive for sleep disturbance. Negative for agitation.       Unable to sleep recently due to left wrist pain from gout    Immunization History  Administered Date(s) Administered  . Influenza,inj,Quad PF,6+ Mos 06/27/2015, 07/29/2016  . PPD Test 04/25/2015  . Pneumococcal Polysaccharide-23 04/25/2015   Pertinent  Health Maintenance Due  Topic Date Due  . INFLUENZA VACCINE  05/07/2018  . COLONOSCOPY  02/20/2019 (Originally 01/26/2014)   Fall Risk  02/19/2018 09/11/2017 07/08/2017 01/09/2016 09/06/2015  Falls in the past year? Yes Yes No No No  Comment - Pt hit throat on rail as he fell - - -  Number falls in past yr: - 1 - - -  Injury with Fall? No Yes - - -  Comment - Pt hurt throat during fall 3 days ago. - - -    Vitals:   10/22/18 1555  BP: (!) 180/80  Pulse: 93  Temp: 97.8 F (36.6 C)  TempSrc: Oral  SpO2: 97%  Weight: 199 lb (90.3 kg)  Height: 6' (1.829 m)   Body mass index is 26.99 kg/m. Physical Exam Vitals signs reviewed.  Constitutional:      Appearance: Normal appearance.     Comments: Holding and guarding left wrist during visit   HENT:      Head: Normocephalic.     Nose: Nose normal. No congestion or rhinorrhea.     Mouth/Throat:     Mouth: Mucous membranes are dry.     Pharynx: Oropharynx is clear. No oropharyngeal exudate or posterior oropharyngeal erythema.  Eyes:     General: No scleral icterus.       Right eye: No discharge.        Left eye: No discharge.     Conjunctiva/sclera: Conjunctivae normal.     Pupils: Pupils are equal, round, and reactive to light.  Neck:     Musculoskeletal: Normal range of motion. No muscular tenderness.     Vascular: No carotid bruit.  Cardiovascular:     Rate and Rhythm: Normal rate and regular rhythm.     Pulses: Normal pulses.     Heart sounds: Normal heart sounds. No murmur. No friction rub. No gallop.   Pulmonary:     Effort: Pulmonary effort is normal. No respiratory distress.     Breath sounds: Normal breath sounds. No wheezing, rhonchi or rales.  Abdominal:     General: Bowel sounds are normal. There is no distension.     Palpations: Abdomen is soft. There is no mass.     Tenderness: There is no abdominal tenderness. There is no right CVA tenderness, left CVA tenderness, guarding or rebound.  Musculoskeletal: Normal range of motion.        General: No swelling or tenderness.     Right lower leg: No edema.     Left lower leg: No edema.  Lymphadenopathy:     Cervical: No cervical adenopathy.  Skin:    General: Skin is warm and dry.     Coloration: Skin is not pale.     Findings: No erythema or rash.  Neurological:     Mental Status: He is alert and oriented to person, place, and time.     Gait: Gait normal.  Psychiatric:        Mood and Affect: Mood normal.        Speech: Speech normal.        Behavior: Behavior normal.        Thought Content: Thought content normal.        Judgment: Judgment normal.    Labs reviewed: Recent Labs    02/27/18 0848 04/07/18 1004  NA 135 136  K 4.2 3.9  CL 97* 101  CO2 29 25  GLUCOSE 110* 170*  BUN 14 8  CREATININE 0.92 0.99    CALCIUM 9.4 9.3   Recent Labs    02/27/18 0848 04/07/18 1004  AST 20 23  ALT 12 14  BILITOT 0.6 0.5  PROT 7.5 7.4   Recent Labs    02/27/18 0848 04/07/18 1004  WBC 7.9 6.2  NEUTROABS 2,354  --   HGB 13.7 13.9  HCT 39.4 40.7  MCV 87.6 88.1  PLT 240 237   Lab Results  Component Value Date   TSH 0.715 07/29/2012   Lab Results  Component Value Date   HGBA1C 5.9 (H) 02/27/2018   Lab Results  Component Value Date   CHOL 263 (H) 04/07/2018   HDL 55 04/07/2018   LDLCALC 174 (H) 04/07/2018   TRIG 184 (H) 04/07/2018   CHOLHDL 4.8 04/07/2018    Significant Diagnostic Results in last 30 days:  No results found.  Assessment/Plan 1. Essential hypertension SBP elevated during this visit.Asymptomatic.suspected elevated readings due to his left wrist gout pain.will continue on current medication.encouraged to check blood pressure at least three times per week and notify provider if blood pressure > 160/90.Also to bring blood pressure log to all office visit.Smoking and alcohol cessation advised  2. Smoker Smoking and alcohol cessation discussed during visit.Education material also provided.continue to encourage to quit smoking.   3. Hyperlipidemia, unspecified hyperlipidemia type Latest chol 263,LDL 174,TRG 180 (04/2018).Has atorvastatin on medication list but states stopped taking does not want too many pills.Importance of medication discussed.He has agreed to restart medication.  4. ETOH abuse cessation advised.continue to encourage to stop drinking beer.    5.Gout  Afebrile.Left wrist swollen,red and tender to touch.He was evaluated in the ER prior to visit colchicine 0.6 mg tablet take 2 tablets x 1 dose then take one tablet after one hour may repeat x 3 days ordered.He will be picking up colchine later today after the visit from pharmacy.Had stopped taking allopurinol without advise from provider.Encouraged to resume Allopurinol after completing colchine.verbalized  understanding.encourage to increase water intake.    Family/ staff Communication: Reviewed plan of care with patient.  Labs/tests  ordered: None   Sandrea Hughs, NP

## 2018-10-22 NOTE — ED Triage Notes (Signed)
C/o L wrist pain and swelling x 2 days.  Pt states he believes it is gout and he is unable to sleep. No known injury.

## 2018-11-06 ENCOUNTER — Other Ambulatory Visit: Payer: Self-pay | Admitting: Nurse Practitioner

## 2018-11-06 DIAGNOSIS — I10 Essential (primary) hypertension: Secondary | ICD-10-CM

## 2018-11-08 DIAGNOSIS — R112 Nausea with vomiting, unspecified: Secondary | ICD-10-CM | POA: Diagnosis not present

## 2018-11-08 DIAGNOSIS — R200 Anesthesia of skin: Secondary | ICD-10-CM | POA: Diagnosis not present

## 2018-11-08 DIAGNOSIS — F17210 Nicotine dependence, cigarettes, uncomplicated: Secondary | ICD-10-CM | POA: Diagnosis not present

## 2018-11-08 DIAGNOSIS — R0602 Shortness of breath: Secondary | ICD-10-CM | POA: Diagnosis not present

## 2018-11-08 DIAGNOSIS — R51 Headache: Secondary | ICD-10-CM | POA: Diagnosis not present

## 2018-11-08 DIAGNOSIS — G930 Cerebral cysts: Secondary | ICD-10-CM | POA: Diagnosis not present

## 2018-11-08 DIAGNOSIS — I10 Essential (primary) hypertension: Secondary | ICD-10-CM | POA: Diagnosis not present

## 2018-12-02 ENCOUNTER — Other Ambulatory Visit: Payer: Self-pay | Admitting: Nurse Practitioner

## 2018-12-02 DIAGNOSIS — I10 Essential (primary) hypertension: Secondary | ICD-10-CM

## 2019-01-10 ENCOUNTER — Other Ambulatory Visit: Payer: Self-pay | Admitting: Internal Medicine

## 2019-01-10 DIAGNOSIS — B20 Human immunodeficiency virus [HIV] disease: Secondary | ICD-10-CM

## 2019-01-19 ENCOUNTER — Ambulatory Visit: Payer: BLUE CROSS/BLUE SHIELD

## 2019-01-19 ENCOUNTER — Other Ambulatory Visit: Payer: Self-pay

## 2019-01-19 ENCOUNTER — Encounter: Payer: Self-pay | Admitting: Internal Medicine

## 2019-01-19 DIAGNOSIS — B20 Human immunodeficiency virus [HIV] disease: Secondary | ICD-10-CM

## 2019-01-20 LAB — T-HELPER CELL (CD4) - (RCID CLINIC ONLY)
CD4 % Helper T Cell: 10 % — ABNORMAL LOW (ref 33–55)
CD4 T Cell Abs: 390 /uL — ABNORMAL LOW (ref 400–2700)

## 2019-01-21 LAB — HIV-1 RNA QUANT-NO REFLEX-BLD
HIV 1 RNA Quant: 81 copies/mL — ABNORMAL HIGH
HIV-1 RNA Quant, Log: 1.91 Log copies/mL — ABNORMAL HIGH

## 2019-02-01 ENCOUNTER — Telehealth: Payer: Self-pay | Admitting: Internal Medicine

## 2019-02-01 NOTE — Telephone Encounter (Signed)
COVID-19 Pre-Screening Questions: ° °Do you currently have a fever (>100 °F), chills or unexplained body aches? No   ° °Are you currently experiencing new cough, shortness of breath, sore throat, runny nose? No   °•  °Have you recently travelled outside the state of [place] in the last 14 days? no °•  °1. Have you been in contact with someone that is currently pending confirmation of Covid19 testing or has been confirmed to have the Covid19 virus?  No  ° °

## 2019-02-02 ENCOUNTER — Encounter: Payer: BLUE CROSS/BLUE SHIELD | Admitting: Internal Medicine

## 2019-02-10 ENCOUNTER — Other Ambulatory Visit: Payer: Self-pay | Admitting: Nurse Practitioner

## 2019-02-10 DIAGNOSIS — I10 Essential (primary) hypertension: Secondary | ICD-10-CM

## 2019-02-12 ENCOUNTER — Other Ambulatory Visit: Payer: Self-pay | Admitting: Internal Medicine

## 2019-02-12 DIAGNOSIS — B20 Human immunodeficiency virus [HIV] disease: Secondary | ICD-10-CM

## 2019-05-28 ENCOUNTER — Other Ambulatory Visit: Payer: Self-pay | Admitting: Nurse Practitioner

## 2019-05-28 DIAGNOSIS — I10 Essential (primary) hypertension: Secondary | ICD-10-CM

## 2019-06-22 ENCOUNTER — Other Ambulatory Visit: Payer: Self-pay | Admitting: Internal Medicine

## 2019-06-22 DIAGNOSIS — B20 Human immunodeficiency virus [HIV] disease: Secondary | ICD-10-CM

## 2019-07-05 ENCOUNTER — Other Ambulatory Visit: Payer: Self-pay

## 2019-07-05 ENCOUNTER — Ambulatory Visit: Payer: Self-pay

## 2019-07-05 DIAGNOSIS — Z79899 Other long term (current) drug therapy: Secondary | ICD-10-CM

## 2019-07-05 DIAGNOSIS — B20 Human immunodeficiency virus [HIV] disease: Secondary | ICD-10-CM

## 2019-07-05 DIAGNOSIS — Z113 Encounter for screening for infections with a predominantly sexual mode of transmission: Secondary | ICD-10-CM

## 2019-07-20 ENCOUNTER — Encounter: Payer: Self-pay | Admitting: Internal Medicine

## 2019-07-20 ENCOUNTER — Ambulatory Visit: Payer: Self-pay

## 2019-07-20 ENCOUNTER — Ambulatory Visit (INDEPENDENT_AMBULATORY_CARE_PROVIDER_SITE_OTHER): Payer: Self-pay | Admitting: Internal Medicine

## 2019-07-20 ENCOUNTER — Other Ambulatory Visit: Payer: Self-pay

## 2019-07-20 DIAGNOSIS — B20 Human immunodeficiency virus [HIV] disease: Secondary | ICD-10-CM

## 2019-07-20 DIAGNOSIS — Z23 Encounter for immunization: Secondary | ICD-10-CM

## 2019-07-20 MED ORDER — ODEFSEY 200-25-25 MG PO TABS: 1.0000 | ORAL_TABLET | Freq: Every day | ORAL | 11 refills | Status: DC

## 2019-07-20 NOTE — Assessment & Plan Note (Signed)
His infection has been under good control and he has had near complete CD4 reconstitution since starting therapy 4 years ago.  He will get repeat lab work today, continue [place] and follow-up in 6 months.

## 2019-07-20 NOTE — Progress Notes (Signed)
Patient Active Problem List   Diagnosis Date Noted  . HIV disease (HCC) 05/18/2015    Priority: High  . Hypertensive emergency 06/02/2017  . Alcohol abuse 06/01/2017  . Pain, dental 07/29/2016  . Cough 07/29/2016  . Rash 06/23/2015  . Acute gouty arthritis 01/30/2015  . Oral thrush 01/30/2015  . Tobacco abuse 07/30/2012  . Hypertension 07/29/2012    Patient's Medications  New Prescriptions   No medications on file  Previous Medications   ALBUTEROL (PROVENTIL HFA;VENTOLIN HFA) 108 (90 BASE) MCG/ACT INHALER    Inhale 2 puffs into the lungs every 4 (four) hours as needed for wheezing or shortness of breath.   ALLOPURINOL (ZYLOPRIM) 100 MG TABLET    Take 1 tablet (100 mg total) by mouth daily.   ASPIRIN EC 81 MG TABLET    Take 81 mg by mouth daily.   ATORVASTATIN (LIPITOR) 80 MG TABLET    1/2 Tablet by mouth daily x 1 week then increase to 1 tablet daily   CELECOXIB (CELEBREX) 200 MG CAPSULE    Take 1 capsule (200 mg total) by mouth 2 (two) times daily.   COLCHICINE 0.6 MG TABLET    Take 2 tablets now then a third tablet 1 hour later.  May repeat every 3 days until gout flare subsides.   FLUTICASONE (FLONASE) 50 MCG/ACT NASAL SPRAY    Place 2 sprays into both nostrils daily.   HYDROCHLOROTHIAZIDE (HYDRODIURIL) 50 MG TABLET    TAKE 1 TABLET(50 MG) BY MOUTH DAILY   LISINOPRIL (ZESTRIL) 10 MG TABLET    TAKE 1 TABLET BY MOUTH DAILY   NAPROXEN (NAPROSYN) 500 MG TABLET    Take 1 tablet (500 mg total) by mouth 2 (two) times daily.   SODIUM CHLORIDE (OCEAN) 0.65 % SOLN NASAL SPRAY    Place 1 spray into both nostrils as needed for congestion.  Modified Medications   Modified Medication Previous Medication   EMTRICITABINE-RILPIVIR-TENOFOVIR AF (ODEFSEY) 200-25-25 MG TABS TABLET ODEFSEY 200-25-25 MG TABS tablet      Take 1 tablet by mouth daily.    TAKE 1 TABLET BY MOUTH DAILY  Discontinued Medications   No medications on file    Subjective: Phares is in for his routine HIV  follow-up visit.  A few months ago he was late renewing his ADAP and was out of his Odefsey for 3 days.  When he has had his medication he rarely misses doses.  He takes it each morning.  He has not been working during the [employer] pandemic.  He is feeling well.  Review of Systems: Review of Systems  Constitutional: Negative for fever.  Respiratory: Negative for cough and shortness of breath.   Cardiovascular: Negative for chest pain.  Gastrointestinal: Negative for abdominal pain, diarrhea, nausea and vomiting.  Psychiatric/Behavioral: Negative for depression.    Past Medical History:  Diagnosis Date  . Gout   . HIV infection (HCC)   . Hyperlipidemia   . Hypertension   . Substance abuse (HCC)    alcohol   . Tobacco abuse     Social History   Tobacco Use  . Smoking status: Current Every Day Smoker    Packs/day: 1.00    Years: 26.00    Pack years: 26.00    Types: Cigarettes  . Smokeless tobacco: Never Used  . Tobacco comment: states he is trying to cut back  Substance Use Topics  . Alcohol use: Yes    Alcohol/week: 5.0 standard  drinks    Types: 5 Cans of beer per week    Comment: occassionally   . Drug use: Not Currently    Family History  Problem Relation Age of Onset  . Other Other        No known heart disease    Allergies  Allergen Reactions  . Genvoya [Elviteg-Cobic-Emtricit-Tenofaf] Rash    Health Maintenance  Topic Date Due  . TETANUS/TDAP  01/27/1983  . COLONOSCOPY  01/26/2014  . INFLUENZA VACCINE  05/08/2019  . Hepatitis C Screening  Completed  . HIV Screening  Completed    Objective:  Vitals:   07/20/19 1157  BP: (!) 138/58  Pulse: (!) 106  Temp: 98.2 F (36.8 C)  TempSrc: Oral  SpO2: 100%   There is no height or weight on file to calculate BMI.  Physical Exam Constitutional:      Comments: He is in good spirits.  Cardiovascular:     Rate and Rhythm: Normal rate and regular rhythm.     Heart sounds: No murmur.  Pulmonary:      Effort: Pulmonary effort is normal.     Breath sounds: Normal breath sounds.  Psychiatric:        Mood and Affect: Mood normal.     Lab Results Lab Results  Component Value Date   WBC 6.2 04/07/2018   HGB 13.9 04/07/2018   HCT 40.7 04/07/2018   MCV 88.1 04/07/2018   PLT 237 04/07/2018    Lab Results  Component Value Date   CREATININE 0.99 04/07/2018   BUN 8 04/07/2018   NA 136 04/07/2018   K 3.9 04/07/2018   CL 101 04/07/2018   CO2 25 04/07/2018    Lab Results  Component Value Date   ALT 14 04/07/2018   AST 23 04/07/2018   ALKPHOS 64 06/02/2017   BILITOT 0.5 04/07/2018    Lab Results  Component Value Date   CHOL 263 (H) 04/07/2018   HDL 55 04/07/2018   LDLCALC 174 (H) 04/07/2018   TRIG 184 (H) 04/07/2018   CHOLHDL 4.8 04/07/2018   Lab Results  Component Value Date   LABRPR NON-REACTIVE 04/07/2018   HIV 1 RNA Quant (copies/mL)  Date Value  01/19/2019 81 (H)  04/07/2018 39 (H)  04/08/2017 <20 NOT DETECTED   CD4 T Cell Abs (/uL)  Date Value  01/19/2019 390 (L)  04/07/2018 330 (L)  04/08/2017 280 (L)     Problem List Items Addressed This Visit      High   HIV disease ([facility])    His infection has been under good control and he has had near complete CD4 reconstitution since starting therapy 4 years ago.  He will get repeat lab work today, continue [place] and follow-up in 6 months.      Relevant Medications   emtricitabine-rilpivir-tenofovir AF (ODEFSEY) 200-25-25 MG TABS tablet   Other Relevant Orders   T-helper cell (CD4)- ([facility] clinic only)   HIV-1 RNA quant-no reflex-bld    Other Visit Diagnoses    Need for immunization against influenza       Relevant Orders   Flu Vaccine QUAD 36+ mos IM (Completed)        Michel Bickers, MD [facility] for [place] [address] [phone] pager   [phone] cell 07/20/2019, 12:04 PM

## 2019-07-21 LAB — T-HELPER CELL (CD4) - (RCID CLINIC ONLY)
CD4 % Helper T Cell: 10 % — ABNORMAL LOW (ref 33–65)
CD4 T Cell Abs: 461 /uL (ref 400–1790)

## 2019-07-23 LAB — HIV-1 RNA QUANT-NO REFLEX-BLD
HIV 1 RNA Quant: <20 copies/mL
HIV-1 RNA Quant, Log: <1.3 Log copies/mL

## 2019-08-09 ENCOUNTER — Other Ambulatory Visit: Payer: Self-pay | Admitting: Nurse Practitioner

## 2019-08-09 DIAGNOSIS — I10 Essential (primary) hypertension: Secondary | ICD-10-CM

## 2019-08-16 ENCOUNTER — Ambulatory Visit: Payer: Self-pay | Admitting: Nurse Practitioner

## 2019-08-25 ENCOUNTER — Ambulatory Visit: Payer: Self-pay | Admitting: Nurse Practitioner

## 2019-08-27 ENCOUNTER — Encounter: Payer: Self-pay | Admitting: Nurse Practitioner

## 2019-08-27 ENCOUNTER — Other Ambulatory Visit: Payer: Self-pay

## 2019-08-27 ENCOUNTER — Ambulatory Visit (INDEPENDENT_AMBULATORY_CARE_PROVIDER_SITE_OTHER): Payer: Self-pay | Admitting: Nurse Practitioner

## 2019-08-27 VITALS — BP 178/86 | HR 98 | Temp 98.0°F | Ht 72.0 in | Wt 210.0 lb

## 2019-08-27 DIAGNOSIS — Z9114 Patient's other noncompliance with medication regimen: Secondary | ICD-10-CM

## 2019-08-27 DIAGNOSIS — E663 Overweight: Secondary | ICD-10-CM

## 2019-08-27 DIAGNOSIS — B20 Human immunodeficiency virus [HIV] disease: Secondary | ICD-10-CM

## 2019-08-27 DIAGNOSIS — E785 Hyperlipidemia, unspecified: Secondary | ICD-10-CM

## 2019-08-27 DIAGNOSIS — Z6828 Body mass index (BMI) 28.0-28.9, adult: Secondary | ICD-10-CM

## 2019-08-27 DIAGNOSIS — F1010 Alcohol abuse, uncomplicated: Secondary | ICD-10-CM

## 2019-08-27 DIAGNOSIS — M1A9XX0 Chronic gout, unspecified, without tophus (tophi): Secondary | ICD-10-CM

## 2019-08-27 DIAGNOSIS — E782 Mixed hyperlipidemia: Secondary | ICD-10-CM

## 2019-08-27 DIAGNOSIS — K1379 Other lesions of oral mucosa: Secondary | ICD-10-CM

## 2019-08-27 DIAGNOSIS — I10 Essential (primary) hypertension: Secondary | ICD-10-CM

## 2019-08-27 DIAGNOSIS — F17200 Nicotine dependence, unspecified, uncomplicated: Secondary | ICD-10-CM

## 2019-08-27 MED ORDER — LISINOPRIL 20 MG PO TABS: 20.0000 mg | ORAL_TABLET | Freq: Every day | ORAL | 0 refills | Status: DC

## 2019-08-27 MED ORDER — LISINOPRIL 20 MG PO TABS: 20.0000 mg | ORAL_TABLET | Freq: Every day | ORAL | 1 refills | Status: DC

## 2019-08-27 MED ORDER — ATORVASTATIN CALCIUM 80 MG PO TABS: ORAL_TABLET | ORAL | 6 refills | Status: DC

## 2019-08-27 MED ORDER — ATORVASTATIN CALCIUM 80 MG PO TABS: ORAL_TABLET | ORAL | 0 refills | Status: DC

## 2019-08-27 MED ORDER — HYDROCHLOROTHIAZIDE 50 MG PO TABS: 50.0000 mg | ORAL_TABLET | Freq: Every day | ORAL | 0 refills | Status: DC

## 2019-08-27 NOTE — Progress Notes (Signed)
Careteam: Patient Care Team: Lauree Chandler, NP as PCP - General (Geriatric Medicine) Michel Bickers, MD as Consulting Physician (Infectious Diseases)  Advanced Directive information    Allergies  Allergen Reactions  . Genvoya [Elviteg-Cobic-Emtricit-Tenofaf] Rash    Chief Complaint  Patient presents with  . Medical Management of Chronic Issues    10 month follow-up   . Immunizations    Discuss need for TD/TDaP   . Quality Metric Gaps    Discuss need for Colonoscopy      HPI: Patient is a 53 y.o. male seen in the office today for routine follow up.   Does not wish to be screened for colon cancer via colonoscopy- reports he will try cologuard but not without insurance which he currently does not have  ETOH- drinking 4 days a week, 2-3 beers, does not get intoxicated.   Smoker- continues to pack a day. Has a lot of stress and problems, not willing to quit smoking at this time.   Gout- no recent gout flares, reports he is not talking allopurinol. Reports he has tired to modify diet and beer due to gout.   Hyperlipidemia- not taking cholesterol medication- not fasting this morning. "took what I had and it was gone" did not request refill. Not working and worried about cost.   Hypertension- taking his hctz and lisinopril- blood pressure remains high  HIV- continues to follow up with ID and is taking HIV medication   Allergic rhinnitis- off flonase, no congestion or allergy symptoms  Pt reports soreness to left cheek that has been going on for ~1 week.   Review of Systems:  Review of Systems  Constitutional: Negative for chills, fever and weight loss.  HENT: Negative for congestion, sore throat and tinnitus.   Eyes: Negative for blurred vision.  Respiratory: Negative for cough, sputum production and shortness of breath.   Cardiovascular: Negative for chest pain, palpitations and leg swelling.  Gastrointestinal: Negative for abdominal pain, constipation, diarrhea  and heartburn.  Genitourinary: Negative for dysuria, frequency and urgency.  Musculoskeletal: Negative for back pain, falls, joint pain and myalgias.  Skin: Negative.   Neurological: Negative for dizziness and headaches.  Psychiatric/Behavioral: Negative for depression and memory loss. The patient does not have insomnia.     Past Medical History:  Diagnosis Date  . Gout   . HIV infection ([place])   . Hyperlipidemia   . Hypertension   . Substance abuse ([place])    alcohol   . Tobacco abuse    Past Surgical History:  Procedure Laterality Date  . WRIST FRACTURE SURGERY  ~ 2009   right; "put a plate in it" (88/89/1694)   Social History:   reports that he has been smoking cigarettes. He has a 26.00 pack-year smoking history. He has never used smokeless tobacco. He reports current alcohol use of about 5.0 standard drinks of alcohol per week. He reports previous drug use.  Family History  Problem Relation Age of Onset  . Other Other        No known heart disease    Medications: Patient's Medications  New Prescriptions   No medications on file  Previous Medications   ASPIRIN EC 81 MG TABLET    Take 81 mg by mouth daily.   ATORVASTATIN (LIPITOR) 80 MG TABLET    1/2 Tablet by mouth daily x 1 week then increase to 1 tablet daily   EMTRICITABINE-RILPIVIR-TENOFOVIR AF (ODEFSEY) 200-25-25 MG TABS TABLET    Take 1 tablet by mouth  daily.   HYDROCHLOROTHIAZIDE (HYDRODIURIL) 50 MG TABLET    TAKE 1 TABLET(50 MG) BY MOUTH DAILY   LISINOPRIL (ZESTRIL) 10 MG TABLET    TAKE 1 TABLET BY MOUTH DAILY   NAPROXEN (NAPROSYN) 500 MG TABLET    Take 1 tablet (500 mg total) by mouth 2 (two) times daily.  Modified Medications   No medications on file  Discontinued Medications   ALBUTEROL (PROVENTIL HFA;VENTOLIN HFA) 108 (90 BASE) MCG/ACT INHALER    Inhale 2 puffs into the lungs every 4 (four) hours as needed for wheezing or shortness of breath.   ALLOPURINOL (ZYLOPRIM) 100 MG TABLET    Take 1 tablet (100 mg  total) by mouth daily.   CELECOXIB (CELEBREX) 200 MG CAPSULE    Take 1 capsule (200 mg total) by mouth 2 (two) times daily.   COLCHICINE 0.6 MG TABLET    Take 2 tablets now then a third tablet 1 hour later.  May repeat every 3 days until gout flare subsides.   FLUTICASONE (FLONASE) 50 MCG/ACT NASAL SPRAY    Place 2 sprays into both nostrils daily.   SODIUM CHLORIDE (OCEAN) 0.65 % SOLN NASAL SPRAY    Place 1 spray into both nostrils as needed for congestion.    Physical Exam:  Vitals:   08/27/19 1040  BP: (!) 178/86  Pulse: 98  Temp: 98 F (36.7 C)  TempSrc: Temporal  SpO2: 98%  Weight: 210 lb (95.3 kg)  Height: 6' (1.829 m)   Body mass index is 28.48 kg/m. Wt Readings from Last 3 Encounters:  08/27/19 210 lb (95.3 kg)  10/22/18 199 lb (90.3 kg)  10/22/18 200 lb (90.7 kg)    Physical Exam Constitutional:      General: He is not in acute distress.    Appearance: He is well-developed. He is not diaphoretic.  HENT:     Head: Normocephalic and atraumatic.     Mouth/Throat:     Pharynx: No oropharyngeal exudate or posterior oropharyngeal erythema.     Comments: Small induration to back of left cheek at tooth line, no erythema or drainage Eyes:     Conjunctiva/sclera: Conjunctivae normal.     Pupils: Pupils are equal, round, and reactive to light.  Neck:     Musculoskeletal: Normal range of motion and neck supple.  Cardiovascular:     Rate and Rhythm: Normal rate and regular rhythm.     Heart sounds: Normal heart sounds.  Pulmonary:     Effort: Pulmonary effort is normal.     Breath sounds: Normal breath sounds.  Abdominal:     General: Bowel sounds are normal.     Palpations: Abdomen is soft.  Musculoskeletal:        General: No tenderness.  Skin:    General: Skin is warm and dry.  Neurological:     Mental Status: He is alert and oriented to person, place, and time.    Labs reviewed: Basic Metabolic Panel: No results for input(s): NA, K, CL, CO2, GLUCOSE, BUN,  CREATININE, CALCIUM, MG, PHOS, TSH in the last 8760 hours. Liver Function Tests: No results for input(s): AST, ALT, ALKPHOS, BILITOT, PROT, ALBUMIN in the last 8760 hours. No results for input(s): LIPASE, AMYLASE in the last 8760 hours. No results for input(s): AMMONIA in the last 8760 hours. CBC: No results for input(s): WBC, NEUTROABS, HGB, HCT, MCV, PLT in the last 8760 hours. Lipid Panel: No results for input(s): CHOL, HDL, LDLCALC, TRIG, CHOLHDL, LDLDIRECT in the last 8760 hours.  TSH: No results for input(s): TSH in the last 8760 hours. A1C: Lab Results  Component Value Date   HGBA1C 5.9 (H) 02/27/2018     Assessment/Plan 1. Chronic gout without tophus, unspecified cause, unspecified site -has not been on medication, states he has made dietary changes, no recent flares.  - Uric Acid  2. Essential hypertension -not at goal. Pt reports trying to make dietary modification. DASH diet given. Will increase lisinopril to 20 mg daily and to continue hctz. Pt needing blood work today but declined due to having to pay out of pocket.  - CBC with Differential/Platelet - hydrochlorothiazide (HYDRODIURIL) 50 MG tablet; Take 1 tablet (50 mg total) by mouth daily.  Dispense: 30 tablet; Refill: 0 - lisinopril (ZESTRIL) 20 MG tablet; Take 1 tablet (20 mg total) by mouth daily.  Dispense: 30 tablet; Refill: 0  3. Mixed hyperlipidemia -pt has stopped lipitor- never got refilled.  -after refill sent to pharmacy pt refuses lab due to cost. States he is not going to pay for lab work. Lab tech gave him information on a lab that will work with him financially based on income. Stressed the importance of keeping up with his liver enzymes while on medication and making sure cholesterol is controlled to reduce risk of MI and stroke. Pt reports understanding.  - atorvastatin (LIPITOR) 80 MG tablet; 1/2 Tablet by mouth daily x 1 week then increase to 1 tablet daily  Dispense: 30 tablet; Refill: 0 - CMP with  eGFR([facility]) - Lipid Panel  4. HIV disease ([place]) -continues to follow up with ID, compliant with medications prescribed for his HIV  5. Smoker -encouraged cessation he is not willing to cut back or quit.   6. ETOH abuse Encouraged to cut back on ETOH use and cessation.   7. Hyperlipidemia, unspecified hyperlipidemia type -not at goal, recommended treatment and follow up lab, see #3  8. Overweight with body mass index (BMI) 25.0-29.9 -discussed weight loss with increase physical activity and dietary modifications.  9. Body mass index 28.0-28.9, adult -noted today and discussed weight loss with dietary modifications and exercise.   10. Noncompliance with medications Pt not taking a lot of medication due to cost. Also reports he will not get labs today. Expressed the importance of needing to follow up renal function and electrolytes with the lisinopril and HCTZ that he was taking for blood pressure and his liver enzymes with his statin. He has not gotten lab in over 1 year- last lab July 2019. Pt was educated that if he does not get blood work in the next 30 days we will not be able to prescribe medications which he needs for his uncontrolled hypertension and hyperlipidemia.  He is at high risk for MI and CVA without proper treatment and he is aware of this. His ETOH and smoking also puts him at increase risk and all this was explained today in detail.  -pt educated about 4$ list at [place] -smoking 1 ppd which involves significant monthly expense -he is not following recommendations or following up as directed in the past.   11. Mouth sore -possible aphthous ulcer, at tooth line so this could also be effecting. Suspect he is biting area as well. Encouraged to use salt water solution to gargle swish and spit 2-3 times daily until resolves. If worsen or does not improve to notify office after 2 weeks- will also touch base at follow up appt in 4 weeks.  Next appt: 4 weeks for bp check, lab,  and to check mouth.  Carlos [employer]. [place], [facility] [phone]

## 2019-08-27 NOTE — Patient Instructions (Addendum)
INCREASE lisinopril to 20 mg  -get new Rx at the pharmacy   Swish and gargle and spit salt water a few times daily  Notify if symptoms persist or worsen Will follow up at next follow up to ensure it is better.   DASH Eating Plan DASH stands for "Dietary Approaches to Stop Hypertension." The DASH eating plan is a healthy eating plan that has been shown to reduce high blood pressure (hypertension). It may also reduce your risk for type 2 diabetes, heart disease, and stroke. The DASH eating plan may also help with weight loss. What are tips for following this plan?  General guidelines  Avoid eating more than 2,300 mg (milligrams) of salt (sodium) a day. If you have hypertension, you may need to reduce your sodium intake to 1,500 mg a day.  Limit alcohol intake to no more than 1 drink a day for nonpregnant women and 2 drinks a day for men. One drink equals 12 oz of beer, 5 oz of wine, or 1 oz of hard liquor.  Work with your health care provider to maintain a healthy body weight or to lose weight. Ask what an ideal weight is for you.  Get at least 30 minutes of exercise that causes your heart to beat faster (aerobic exercise) most days of the week. Activities may include walking, swimming, or biking.  Work with your health care provider or diet and nutrition specialist (dietitian) to adjust your eating plan to your individual calorie needs. Reading food labels   Check food labels for the amount of sodium per serving. Choose foods with less than 5 percent of the Daily Value of sodium. Generally, foods with less than 300 mg of sodium per serving fit into this eating plan.  To find whole grains, look for the word "whole" as the first word in the ingredient list. Shopping  Buy products labeled as "low-sodium" or "no salt added."  Buy fresh foods. Avoid canned foods and premade or frozen meals. Cooking  Avoid adding salt when cooking. Use salt-free seasonings or herbs instead of table salt  or sea salt. Check with your health care provider or pharmacist before using salt substitutes.  Do not fry foods. Cook foods using healthy methods such as baking, boiling, grilling, and broiling instead.  Cook with heart-healthy oils, such as olive, canola, soybean, or sunflower oil. Meal planning  Eat a balanced diet that includes: ? 5 or more servings of fruits and vegetables each day. At each meal, try to fill half of your plate with fruits and vegetables. ? Up to 6-8 servings of whole grains each day. ? Less than 6 oz of lean meat, poultry, or fish each day. A 3-oz serving of meat is about the same size as a deck of cards. One egg equals 1 oz. ? 2 servings of low-fat dairy each day. ? A serving of nuts, seeds, or beans 5 times each week. ? Heart-healthy fats. Healthy fats called Omega-3 fatty acids are found in foods such as flaxseeds and coldwater fish, like sardines, salmon, and mackerel.  Limit how much you eat of the following: ? Canned or prepackaged foods. ? Food that is high in trans fat, such as fried foods. ? Food that is high in saturated fat, such as fatty meat. ? Sweets, desserts, sugary drinks, and other foods with added sugar. ? Full-fat dairy products.  Do not salt foods before eating.  Try to eat at least 2 vegetarian meals each week.  Eat more home-cooked  food and less restaurant, buffet, and fast food.  When eating at a restaurant, ask that your food be prepared with less salt or no salt, if possible. What foods are recommended? The items listed may not be a complete list. Talk with your dietitian about what dietary choices are best for you. Grains Whole-grain or whole-wheat bread. Whole-grain or whole-wheat pasta. Brown rice. Brandon Quinn. Bulgur. Whole-grain and low-sodium cereals. Pita bread. Low-fat, low-sodium crackers. Whole-wheat flour tortillas. Vegetables Fresh or frozen vegetables (raw, steamed, roasted, or grilled). Low-sodium or reduced-sodium  tomato and vegetable juice. Low-sodium or reduced-sodium tomato sauce and tomato paste. Low-sodium or reduced-sodium canned vegetables. Fruits All fresh, dried, or frozen fruit. Canned fruit in natural juice (without added sugar). Meat and other protein foods Skinless chicken or [place]. Ground chicken or [place]. Pork with fat trimmed off. Fish and seafood. Egg whites. Dried beans, peas, or lentils. Unsalted nuts, nut butters, and seeds. Unsalted canned beans. Lean cuts of beef with fat trimmed off. Low-sodium, lean deli meat. Dairy Low-fat (1%) or fat-free (skim) milk. Fat-free, low-fat, or reduced-fat cheeses. Nonfat, low-sodium ricotta or cottage cheese. Low-fat or nonfat yogurt. Low-fat, low-sodium cheese. Fats and oils Soft margarine without trans fats. Vegetable oil. Low-fat, reduced-fat, or light mayonnaise and salad dressings (reduced-sodium). Canola, safflower, olive, soybean, and sunflower oils. Avocado. Seasoning and other foods Herbs. Spices. Seasoning mixes without salt. Unsalted popcorn and pretzels. Fat-free sweets. What foods are not recommended? The items listed may not be a complete list. Talk with your dietitian about what dietary choices are best for you. Grains Baked goods made with fat, such as croissants, muffins, or some breads. Dry pasta or rice meal packs. Vegetables Creamed or fried vegetables. Vegetables in a cheese sauce. Regular canned vegetables (not low-sodium or reduced-sodium). Regular canned tomato sauce and paste (not low-sodium or reduced-sodium). Regular tomato and vegetable juice (not low-sodium or reduced-sodium). Brandon Quinn. Olives. Fruits Canned fruit in a light or heavy syrup. Fried fruit. Fruit in cream or butter sauce. Meat and other protein foods Fatty cuts of meat. Ribs. Fried meat. Brandon Quinn. Sausage. Bologna and other processed lunch meats. Salami. Fatback. Hotdogs. Bratwurst. Salted nuts and seeds. Canned beans with added salt. Canned or smoked fish.  Whole eggs or egg yolks. Chicken or [place] with skin. Dairy Whole or 2% milk, cream, and half-and-half. Whole or full-fat cream cheese. Whole-fat or sweetened yogurt. Full-fat cheese. Nondairy creamers. Whipped toppings. Processed cheese and cheese spreads. Fats and oils Butter. Stick margarine. Lard. Shortening. Ghee. Bacon fat. Tropical oils, such as coconut, palm kernel, or palm oil. Seasoning and other foods Salted popcorn and pretzels. Onion salt, garlic salt, seasoned salt, table salt, and sea salt. Worcestershire sauce. Tartar sauce. Barbecue sauce. Teriyaki sauce. Soy sauce, including reduced-sodium. Steak sauce. Canned and packaged gravies. Fish sauce. Oyster sauce. Cocktail sauce. Horseradish that you find on the shelf. Ketchup. Mustard. Meat flavorings and tenderizers. Bouillon cubes. Hot sauce and Tabasco sauce. Premade or packaged marinades. Premade or packaged taco seasonings. Relishes. Regular salad dressings. Where to find more information:  National Heart, Lung, and [place]: https://wilson-eaton.com/  American Heart Association: www.heart.org Summary  The DASH eating plan is a healthy eating plan that has been shown to reduce high blood pressure (hypertension). It may also reduce your risk for type 2 diabetes, heart disease, and stroke.  With the DASH eating plan, you should limit salt (sodium) intake to 2,300 mg a day. If you have hypertension, you may need to reduce your sodium intake to 1,500 mg  a day.  When on the DASH eating plan, aim to eat more fresh fruits and vegetables, whole grains, lean proteins, low-fat dairy, and heart-healthy fats.  Work with your health care provider or diet and nutrition specialist (dietitian) to adjust your eating plan to your individual calorie needs. This information is not intended to replace advice given to you by your health care provider. Make sure you discuss any questions you have with your health care provider. Document Released:  09/12/2011 Document Revised: 09/05/2017 Document Reviewed: 09/16/2016 Elsevier Patient Education  2020 [employer].

## 2019-09-28 ENCOUNTER — Other Ambulatory Visit: Payer: Self-pay | Admitting: Nurse Practitioner

## 2019-09-28 DIAGNOSIS — I10 Essential (primary) hypertension: Secondary | ICD-10-CM

## 2019-09-30 ENCOUNTER — Encounter: Payer: Self-pay | Admitting: Internal Medicine

## 2019-09-30 ENCOUNTER — Other Ambulatory Visit: Payer: Self-pay | Admitting: Internal Medicine

## 2019-09-30 DIAGNOSIS — I10 Essential (primary) hypertension: Secondary | ICD-10-CM

## 2019-09-30 MED ORDER — LISINOPRIL 20 MG PO TABS: 20.0000 mg | ORAL_TABLET | Freq: Every day | ORAL | 0 refills | Status: DC

## 2019-10-03 NOTE — Progress Notes (Signed)
This encounter was created in error - please disregard.

## 2019-10-04 ENCOUNTER — Telehealth: Payer: Self-pay

## 2019-10-04 ENCOUNTER — Telehealth: Payer: Self-pay | Admitting: *Deleted

## 2019-10-04 NOTE — Telephone Encounter (Signed)
LMOM to return call.

## 2019-10-04 NOTE — Telephone Encounter (Signed)
LMOM for patient to return call.

## 2019-10-04 NOTE — Telephone Encounter (Signed)
Patient walked into the office to drop off  His denial letter from unemployment. 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 Patient has also reached out to his PCP requesting the same information. Routing to provider for advise.  Eugenia Mcalpine

## 2019-10-04 NOTE — Telephone Encounter (Signed)
Patient called and stated that he needs a simple letter for unemployment stating that he has underlying medical issues and cannot be confined in small spaces with other people. 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  Please Advise.

## 2019-10-04 NOTE — Telephone Encounter (Signed)
Based on the [place] of commerce division of employment security he does not meet requirements of the law. Based on his medical history would recommend wearing a mask and having his clients wear mask while in the car with him. He can also crack the window(s) to help with circulation of air flow.

## 2019-10-09 NOTE — Telephone Encounter (Signed)
Will need to talk to his PCP

## 2019-10-11 NOTE — Telephone Encounter (Signed)
Patient called and made aware to reach out to PCP

## 2019-11-24 ENCOUNTER — Other Ambulatory Visit: Payer: Self-pay | Admitting: Nurse Practitioner

## 2019-11-24 DIAGNOSIS — I10 Essential (primary) hypertension: Secondary | ICD-10-CM

## 2019-12-26 ENCOUNTER — Other Ambulatory Visit: Payer: Self-pay | Admitting: Nurse Practitioner

## 2019-12-26 DIAGNOSIS — I10 Essential (primary) hypertension: Secondary | ICD-10-CM

## 2019-12-29 ENCOUNTER — Telehealth: Payer: Self-pay

## 2019-12-29 NOTE — Telephone Encounter (Signed)
Patient requested an appointment for Monday 01/03/20 with [name] and this was made.Brandon Quinn  He will discuss payment plan with me when he comes in on that day.  Patient was advised that we did not bill for lab services or make any payment arrangements for labs. Please advise if I need to schedule labs for patient on 01/03/20 as well.  Thank you  [employer]

## 2019-12-29 NOTE — Telephone Encounter (Signed)
We want to take the best care of our patients but this requires lab monitoring to be able to prescribe certain medications. I will fwd to Suzette to give him a call to discuss payment plans.

## 2019-12-29 NOTE — Telephone Encounter (Signed)
I called patient to tell him that we could not refill his medications until he is seen for labs. He stated that we just "wanted him to die." He said he had done everything we have asked of him.  I told him the last labs we had for him were in 2019 and that [name] had documented in her 11/20 visit note that she could not prescribe medications until he had labs.   He said he "didn't like her ways" and hung up on me.

## 2019-12-29 NOTE — Telephone Encounter (Signed)
Patient stating one of his HTN medications was not filled.  He is unable to tell me which one, but sounds like it is the lisinopril.  I see that it said he needs an appointment.  I see from your last note that you can no longer prescribe   "Expressed the importance of needing to follow up renal function and electrolytes with the lisinopril and HCTZ that he was taking for blood pressure and his liver enzymes with his statin. He has not gotten lab in over 1 year- last lab July 2019. Pt was educated that if he does not get blood work in the next 30 days we will not be able to prescribe medications which he needs for his uncontrolled hypertension and hyperlipidemia."  Patient states he gets his stimulus money on 12/31/19 and he can come in for "tests" at that time.   I just wanted to verify that we can't refill his medication before I call him back.  I will schedule a lab appointment.

## 2019-12-29 NOTE — Telephone Encounter (Signed)
We will need to have lab work done prior to filling any medication to make sure we do not need to make adjustments.

## 2020-01-03 ENCOUNTER — Other Ambulatory Visit: Payer: Self-pay

## 2020-01-03 ENCOUNTER — Ambulatory Visit (INDEPENDENT_AMBULATORY_CARE_PROVIDER_SITE_OTHER): Payer: Self-pay | Admitting: Nurse Practitioner

## 2020-01-03 ENCOUNTER — Encounter: Payer: Self-pay | Admitting: Nurse Practitioner

## 2020-01-03 VITALS — BP 178/80 | HR 106 | Temp 96.6°F | Ht 72.0 in | Wt 207.0 lb

## 2020-01-03 DIAGNOSIS — Z5989 Other problems related to housing and economic circumstances: Secondary | ICD-10-CM

## 2020-01-03 DIAGNOSIS — M1A09X0 Idiopathic chronic gout, multiple sites, without tophus (tophi): Secondary | ICD-10-CM

## 2020-01-03 DIAGNOSIS — I10 Essential (primary) hypertension: Secondary | ICD-10-CM

## 2020-01-03 DIAGNOSIS — F17200 Nicotine dependence, unspecified, uncomplicated: Secondary | ICD-10-CM

## 2020-01-03 DIAGNOSIS — J42 Unspecified chronic bronchitis: Secondary | ICD-10-CM | POA: Insufficient documentation

## 2020-01-03 DIAGNOSIS — B20 Human immunodeficiency virus [HIV] disease: Secondary | ICD-10-CM

## 2020-01-03 DIAGNOSIS — Z5971 Insufficient health insurance coverage: Secondary | ICD-10-CM

## 2020-01-03 DIAGNOSIS — F1010 Alcohol abuse, uncomplicated: Secondary | ICD-10-CM

## 2020-01-03 DIAGNOSIS — Z598 Other problems related to housing and economic circumstances: Secondary | ICD-10-CM

## 2020-01-03 DIAGNOSIS — E782 Mixed hyperlipidemia: Secondary | ICD-10-CM

## 2020-01-03 MED ORDER — METOPROLOL TARTRATE 25 MG PO TABS: 25.0000 mg | ORAL_TABLET | Freq: Two times a day (BID) | ORAL | 3 refills | Status: DC

## 2020-01-03 NOTE — Progress Notes (Signed)
Careteam: Patient Care Team: Sharon Seller, NP as PCP - General (Geriatric Medicine) Cliffton Asters, MD as Consulting Physician (Infectious Diseases)  PLACE OF SERVICE:  [facility]  Advanced Directive information    Allergies  Allergen Reactions  . Genvoya [Elviteg-Cobic-Emtricit-Tenofaf] Rash    Chief Complaint  Patient presents with  . Medical Management of Chronic Issues    4 month follow-up      HPI: Patient is a 54 y.o. male for routine follow up.   Reports his blood pressure is always elevated at home. Checks every day- feels good when sbp is 160.  Reports he has no insurance so it has been a rough year.   Reports he is not taking cholesterol medication- did not get refill  Reports he is in section 8 housing, and thinks he can get help with insurance through his case worker.   Review of Systems:  Review of Systems  Constitutional: Negative for chills, fever and weight loss.  HENT: Negative for tinnitus.   Respiratory: Negative for cough, sputum production and shortness of breath.   Cardiovascular: Negative for chest pain, palpitations and leg swelling.  Gastrointestinal: Negative for abdominal pain, constipation, diarrhea and heartburn.  Genitourinary: Negative for dysuria, frequency and urgency.  Musculoskeletal: Negative for back pain, joint pain and myalgias.  Skin: Negative.   Neurological: Negative for dizziness and headaches.  Psychiatric/Behavioral: Negative for depression and memory loss.   Past Medical History:  Diagnosis Date  . Gout   . HIV infection (HCC)   . Hyperlipidemia   . Hypertension   . Substance abuse (HCC)    alcohol   . Tobacco abuse    Past Surgical History:  Procedure Laterality Date  . WRIST FRACTURE SURGERY  ~ 2009   right; "put a plate in it" (38/07/1750)   Social History:   reports that he has been smoking cigarettes. He has a 26.00 pack-year smoking history. He has never used smokeless tobacco. He reports  current alcohol use of about 5.0 standard drinks of alcohol per week. He reports previous drug use.  Family History  Problem Relation Age of Onset  . Other Other        No known heart disease    Medications: Patient's Medications  New Prescriptions   No medications on file  Previous Medications   ASPIRIN EC 81 MG TABLET    Take 81 mg by mouth daily.   ATORVASTATIN (LIPITOR) 80 MG TABLET    1/2 Tablet by mouth daily x 1 week then increase to 1 tablet daily   EMTRICITABINE-RILPIVIR-TENOFOVIR AF (ODEFSEY) 200-25-25 MG TABS TABLET    Take 1 tablet by mouth daily.   HYDROCHLOROTHIAZIDE (HYDRODIURIL) 50 MG TABLET    TAKE 1 TABLET(50 MG) BY MOUTH DAILY   LISINOPRIL (ZESTRIL) 20 MG TABLET    Appointment OVERDUE 1 by mouth daily  Modified Medications   No medications on file  Discontinued Medications   NAPROXEN (NAPROSYN) 500 MG TABLET    Take 1 tablet (500 mg total) by mouth 2 (two) times daily.    Physical Exam:  Vitals:   01/03/20 1340  BP: (!) 178/80  Pulse: (!) 106  Temp: (!) 96.6 F (35.9 C)  TempSrc: Temporal  SpO2: 98%  Weight: 207 lb (93.9 kg)  Height: 6' (1.829 m)   Body mass index is 28.07 kg/m. Wt Readings from Last 3 Encounters:  01/03/20 207 lb (93.9 kg)  08/27/19 210 lb (95.3 kg)  10/22/18 199 lb (90.3 kg)  Physical Exam Constitutional:      General: He is not in acute distress.    Appearance: He is well-developed. He is not diaphoretic.  HENT:     Head: Normocephalic and atraumatic.     Mouth/Throat:     Pharynx: No oropharyngeal exudate.  Eyes:     Conjunctiva/sclera: Conjunctivae normal.     Pupils: Pupils are equal, round, and reactive to light.  Cardiovascular:     Rate and Rhythm: Normal rate and regular rhythm.     Heart sounds: Normal heart sounds.  Pulmonary:     Effort: Pulmonary effort is normal.     Breath sounds: Normal breath sounds.  Abdominal:     General: Bowel sounds are normal.     Palpations: Abdomen is soft.   Musculoskeletal:        General: No tenderness.     Cervical back: Normal range of motion and neck supple.  Skin:    General: Skin is warm and dry.  Neurological:     Mental Status: He is alert and oriented to person, place, and time.  Psychiatric:        Mood and Affect: Mood normal.        Behavior: Behavior normal.    Labs reviewed: Basic Metabolic Panel: No results for input(s): NA, K, CL, CO2, GLUCOSE, BUN, CREATININE, CALCIUM, MG, PHOS, TSH in the last 8760 hours. Liver Function Tests: No results for input(s): AST, ALT, ALKPHOS, BILITOT, PROT, ALBUMIN in the last 8760 hours. No results for input(s): LIPASE, AMYLASE in the last 8760 hours. No results for input(s): AMMONIA in the last 8760 hours. CBC: No results for input(s): WBC, NEUTROABS, HGB, HCT, MCV, PLT in the last 8760 hours. Lipid Panel: No results for input(s): CHOL, HDL, LDLCALC, TRIG, CHOLHDL, LDLDIRECT in the last 8760 hours. TSH: No results for input(s): TSH in the last 8760 hours. A1C: Lab Results  Component Value Date   HGBA1C 5.9 (H) 02/27/2018     Assessment/Plan 1. Uncontrolled hypertension -reports sbp as high as 200 at home but baseline sbp 160s. Educated on risk of end organ damage, heart attack and stroke with BP that high. He states he has not followed up due to lack of insurance. Discussed dietary modification and will add metoprolol. Likely will need additional medication to get to goal. Will check labs at this time. -STRICT precautions given to go to the ED for evaluation if chest pains, headache/blurred vision, weakness, slurred speech, uncontrolled blood pressure continues. He is in agreement. - CBC with Differential/Platelet - COMPLETE METABOLIC PANEL WITH GFR - metoprolol tartrate (LOPRESSOR) 25 MG tablet; Take 1 tablet (25 mg total) by mouth 2 (two) times daily.  Dispense: 180 tablet; Refill: 3  2. Mixed hyperlipidemia -has not continue statin, discussed dietary changes,  - COMPLETE  METABOLIC PANEL WITH GFR - Lipid Panel  4. HIV disease ([place]) -following with ID, continues on odefsey  5. ETOH abuse -encouraged cessation -cutting back slowly and stopping, drinks ~ 66 oz daily of beer  6. Smoker encouraged cessation.  7. Chronic gout of multiple sites, unspecified cause -reports no recent flares, not on routine medication, last uric acid was elevated but declines follow up on this at this time as he is not having any issues. Not on any routine medication but is on diuretic and ETOH abuse.   8. Uninsured States he has a case worker and is in section 8 housing. He does not have insurance at this time so very limited to  what test he wants done. He states he will talk to case worker about options for insurance.    Next appt: 1 week for blood pressure check Nechemia Chiappetta K. Biagio Borg  [facility] [phone]

## 2020-01-03 NOTE — Patient Instructions (Addendum)
Follow up in 1 week for blood pressure check  Drink MORE water   Cut back on beer  ADD METOPROLOL 25 mg by mouth TWICE daily Record blood pressures AFTER you have taken medication- daily and record, bring to follow up visit    Steps to Quit Smoking Smoking tobacco is the leading cause of preventable death. It can affect almost every organ in the body. Smoking puts you and those around you at risk for developing many serious chronic diseases. Quitting smoking can be difficult, but it is one of the best things that you can do for your health. It is never too late to quit. How do I get ready to quit? When you decide to quit smoking, create a plan to help you succeed. Before you quit:  Pick a date to quit. Set a date within the next 2 weeks to give you time to prepare.  Write down the reasons why you are quitting. Keep this list in places where you will see it often.  Tell your family, friends, and co-workers that you are quitting. Support from your loved ones can make quitting easier.  Talk with your health care provider about your options for quitting smoking.  Find out what treatment options are covered by your health insurance.  Identify people, places, things, and activities that make you want to smoke (triggers). Avoid them. What first steps can I take to quit smoking?  Throw away all cigarettes at home, at work, and in your car.  Throw away smoking accessories, such as Scientist, research (medical).  Clean your car. Make sure to empty the ashtray.  Clean your home, including curtains and carpets. What strategies can I use to quit smoking? Talk with your health care provider about combining strategies, such as taking medicines while you are also receiving in-person counseling. Using these two strategies together makes you more likely to succeed in quitting than if you used either strategy on its own.  If you are pregnant or breastfeeding, talk with your health care provider about  finding counseling or other support strategies to quit smoking. Do not take medicine to help you quit smoking unless your health care provider tells you to do so. To quit smoking: Quit right away  Quit smoking completely, instead of gradually reducing how much you smoke over a period of time. Research shows that stopping smoking right away is more successful than gradually quitting.  Attend in-person counseling to help you build problem-solving skills. You are more likely to succeed in quitting if you attend counseling sessions regularly. Even short sessions of 10 minutes can be effective. Take medicine You may take medicines to help you quit smoking. Some medicines require a prescription and some you can purchase over-the-counter. Medicines may have nicotine in them to replace the nicotine in cigarettes. Medicines may:  Help to stop cravings.  Help to relieve withdrawal symptoms. Your health care provider may recommend:  Nicotine patches, gum, or lozenges.  Nicotine inhalers or sprays.  Non-nicotine medicine that is taken by mouth. Find resources Find resources and support systems that can help you to quit smoking and remain smoke-free after you quit. These resources are most helpful when you use them often. They include:  Online chats with a Social worker.  Telephone quitlines.  Printed Furniture conservator/restorer.  Support groups or group counseling.  Text messaging programs.  Mobile phone apps or applications. Use apps that can help you stick to your quit plan by providing reminders, tips, and encouragement. There are many  free apps for mobile devices as well as websites. Examples include Quit Guide from the [employer] and smokefree.gov What things can I do to make it easier to quit?   Reach out to your family and friends for support and encouragement. Call telephone quitlines (1-800-QUIT-NOW), reach out to support groups, or work with a counselor for support.  Ask people who smoke to avoid  smoking around you.  Avoid places that trigger you to smoke, such as bars, parties, or smoke-break areas at work.  Spend time with people who do not smoke.  Lessen the stress in your life. Stress can be a smoking trigger for some people. To lessen stress, try: ? Exercising regularly. ? Doing deep-breathing exercises. ? Doing yoga. ? Meditating. ? Performing a body scan. This involves closing your eyes, scanning your body from head to toe, and noticing which parts of your body are particularly tense. Try to relax the muscles in those areas. How will I feel when I quit smoking? Day 1 to 3 weeks Within the first 24 hours of quitting smoking, you may start to feel withdrawal symptoms. These symptoms are usually most noticeable 2-3 days after quitting, but they usually do not last for more than 2-3 weeks. You may experience these symptoms:  Mood swings.  Restlessness, anxiety, or irritability.  Trouble concentrating.  Dizziness.  Strong cravings for sugary foods and nicotine.  Mild weight gain.  Constipation.  Nausea.  Coughing or a sore throat.  Changes in how the medicines that you take for unrelated issues work in your body.  Depression.  Trouble sleeping (insomnia). Week 3 and afterward After the first 2-3 weeks of quitting, you may start to notice more positive results, such as:  Improved sense of smell and taste.  Decreased coughing and sore throat.  Slower heart rate.  Lower blood pressure.  Clearer skin.  The ability to breathe more easily.  Fewer sick days. Quitting smoking can be very challenging. Do not get discouraged if you are not successful the first time. Some people need to make many attempts to quit before they achieve long-term success. Do your best to stick to your quit plan, and talk with your health care provider if you have any questions or concerns. Summary  Smoking tobacco is the leading cause of preventable death. Quitting smoking is one  of the best things that you can do for your health.  When you decide to quit smoking, create a plan to help you succeed.  Quit smoking right away, not slowly over a period of time.  When you start quitting, seek help from your health care provider, family, or friends. This information is not intended to replace advice given to you by your health care provider. Make sure you discuss any questions you have with your health care provider. Document Revised: 06/18/2019 Document Reviewed: 12/12/2018 Elsevier Patient Education  2020 [employer].

## 2020-01-04 ENCOUNTER — Telehealth: Payer: Self-pay

## 2020-01-04 ENCOUNTER — Emergency Department (HOSPITAL_BASED_OUTPATIENT_CLINIC_OR_DEPARTMENT_OTHER)
Admission: EM | Admit: 2020-01-04 | Discharge: 2020-01-04 | Disposition: A | Discharge status: Home / Self Care | Payer: Self-pay | Attending: Emergency Medicine | Admitting: Emergency Medicine

## 2020-01-04 ENCOUNTER — Other Ambulatory Visit: Payer: Self-pay

## 2020-01-04 ENCOUNTER — Encounter (HOSPITAL_BASED_OUTPATIENT_CLINIC_OR_DEPARTMENT_OTHER): Payer: Self-pay | Admitting: *Deleted

## 2020-01-04 ENCOUNTER — Emergency Department (HOSPITAL_BASED_OUTPATIENT_CLINIC_OR_DEPARTMENT_OTHER): Payer: Self-pay

## 2020-01-04 ENCOUNTER — Other Ambulatory Visit: Payer: Self-pay | Admitting: Nurse Practitioner

## 2020-01-04 ENCOUNTER — Other Ambulatory Visit: Payer: Self-pay | Admitting: *Deleted

## 2020-01-04 DIAGNOSIS — I10 Essential (primary) hypertension: Secondary | ICD-10-CM

## 2020-01-04 DIAGNOSIS — B20 Human immunodeficiency virus [HIV] disease: Secondary | ICD-10-CM | POA: Insufficient documentation

## 2020-01-04 DIAGNOSIS — E871 Hypo-osmolality and hyponatremia: Secondary | ICD-10-CM | POA: Insufficient documentation

## 2020-01-04 DIAGNOSIS — E785 Hyperlipidemia, unspecified: Secondary | ICD-10-CM | POA: Insufficient documentation

## 2020-01-04 DIAGNOSIS — F17210 Nicotine dependence, cigarettes, uncomplicated: Secondary | ICD-10-CM | POA: Insufficient documentation

## 2020-01-04 DIAGNOSIS — Z888 Allergy status to other drugs, medicaments and biological substances status: Secondary | ICD-10-CM | POA: Insufficient documentation

## 2020-01-04 DIAGNOSIS — Z79899 Other long term (current) drug therapy: Secondary | ICD-10-CM | POA: Insufficient documentation

## 2020-01-04 DIAGNOSIS — Z7982 Long term (current) use of aspirin: Secondary | ICD-10-CM | POA: Insufficient documentation

## 2020-01-04 DIAGNOSIS — Z20822 Contact with and (suspected) exposure to covid-19: Secondary | ICD-10-CM | POA: Insufficient documentation

## 2020-01-04 LAB — CBC WITH DIFFERENTIAL/PLATELET
Abs Immature Granulocytes: 0.02 10*3/uL (ref 0.00–0.07)
Absolute Monocytes: 527 cells/uL (ref 200–950)
Basophils Absolute: 72 cells/uL (ref 0–200)
Basophils Absolute: 0 10*3/uL (ref 0.0–0.1)
Basophils Relative: 1.1 %
Basophils Relative: 1 %
Eosinophils Absolute: 98 cells/uL (ref 15–500)
Eosinophils Absolute: 0.1 10*3/uL (ref 0.0–0.5)
Eosinophils Relative: 1.5 %
Eosinophils Relative: 1 %
HCT: 39 % (ref 38.5–50.0)
HCT: 38.8 % — ABNORMAL LOW (ref 39.0–52.0)
Hemoglobin: 13.6 g/dL (ref 13.2–17.1)
Hemoglobin: 13.7 g/dL (ref 13.0–17.0)
Immature Granulocytes: 0 %
Lymphocytes Relative: 33 %
Lymphs Abs: 3419 cells/uL (ref 850–3900)
Lymphs Abs: 1.5 10*3/uL (ref 0.7–4.0)
MCH: 30.8 pg (ref 27.0–33.0)
MCH: 30.4 pg (ref 26.0–34.0)
MCHC: 34.9 g/dL (ref 32.0–36.0)
MCHC: 35.3 g/dL (ref 30.0–36.0)
MCV: 88.4 fL (ref 80.0–100.0)
MCV: 86 fL (ref 80.0–100.0)
MPV: 9.3 fL (ref 7.5–12.5)
Monocytes Absolute: 0.6 10*3/uL (ref 0.1–1.0)
Monocytes Relative: 8.1 %
Monocytes Relative: 13 %
Neutro Abs: 2386 cells/uL (ref 1500–7800)
Neutro Abs: 2.4 10*3/uL (ref 1.7–7.7)
Neutrophils Relative %: 36.7 %
Neutrophils Relative %: 52 %
Platelets: 262 10*3/uL (ref 140–400)
Platelets: 247 10*3/uL (ref 150–400)
RBC: 4.41 10*6/uL (ref 4.20–5.80)
RBC: 4.51 MIL/uL (ref 4.22–5.81)
RDW: 12.5 % (ref 11.0–15.0)
RDW: 11.8 % (ref 11.5–15.5)
Total Lymphocyte: 52.6 %
WBC: 6.5 10*3/uL (ref 3.8–10.8)
WBC: 4.6 10*3/uL (ref 4.0–10.5)
nRBC: 0 % (ref 0.0–0.2)

## 2020-01-04 LAB — URINALYSIS, ROUTINE W REFLEX MICROSCOPIC
Bilirubin Urine: NEGATIVE
Glucose, UA: NEGATIVE mg/dL
Ketones, ur: NEGATIVE mg/dL
Leukocytes,Ua: NEGATIVE
Nitrite: NEGATIVE
Protein, ur: NEGATIVE mg/dL
Specific Gravity, Urine: 1.01 (ref 1.005–1.030)
pH: 7 (ref 5.0–8.0)

## 2020-01-04 LAB — COMPREHENSIVE METABOLIC PANEL
ALT: 26 U/L (ref 0–44)
AST: 33 U/L (ref 15–41)
Albumin: 3.8 g/dL (ref 3.5–5.0)
Alkaline Phosphatase: 77 U/L (ref 38–126)
Anion gap: 11 (ref 5–15)
BUN: 9 mg/dL (ref 6–20)
CO2: 25 mmol/L (ref 22–32)
Calcium: 9 mg/dL (ref 8.9–10.3)
Chloride: 88 mmol/L — ABNORMAL LOW (ref 98–111)
Creatinine, Ser: 0.74 mg/dL (ref 0.61–1.24)
GFR calc Af Amer: >60 mL/min (ref >60)
GFR calc non Af Amer: >60 mL/min (ref >60)
Glucose, Bld: 120 mg/dL — ABNORMAL HIGH (ref 70–99)
Potassium: 3.6 mmol/L (ref 3.5–5.1)
Sodium: 124 mmol/L — ABNORMAL LOW (ref 135–145)
Total Bilirubin: 1 mg/dL (ref 0.3–1.2)
Total Protein: 7.7 g/dL (ref 6.5–8.1)

## 2020-01-04 LAB — COMPLETE METABOLIC PANEL WITHOUT GFR
AG Ratio: 1.3 (calc) (ref 1.0–2.5)
ALT: 21 U/L (ref 9–46)
AST: 29 U/L (ref 10–35)
Albumin: 4.3 g/dL (ref 3.6–5.1)
Alkaline phosphatase (APISO): 82 U/L (ref 35–144)
BUN: 8 mg/dL (ref 7–25)
CO2: 24 mmol/L (ref 20–32)
Calcium: 9.2 mg/dL (ref 8.6–10.3)
Chloride: 87 mmol/L — ABNORMAL LOW (ref 98–110)
Creat: 0.75 mg/dL (ref 0.70–1.33)
GFR, Est African American: 120 mL/min/1.73m2
GFR, Est Non African American: 103 mL/min/1.73m2
Globulin: 3.3 g/dL (ref 1.9–3.7)
Glucose, Bld: 121 mg/dL — ABNORMAL HIGH (ref 65–99)
Potassium: 3.5 mmol/L (ref 3.5–5.3)
Sodium: 122 mmol/L — ABNORMAL LOW (ref 135–146)
Total Bilirubin: 0.4 mg/dL (ref 0.2–1.2)
Total Protein: 7.6 g/dL (ref 6.1–8.1)

## 2020-01-04 LAB — LIPID PANEL
Cholesterol: 228 mg/dL — ABNORMAL HIGH (ref <200)
HDL: 53 mg/dL (ref >=40)
LDL Cholesterol (Calc): 135 mg/dL (calc) — ABNORMAL HIGH
Non-HDL Cholesterol (Calc): 175 mg/dL (calc) — ABNORMAL HIGH (ref <130)
Total CHOL/HDL Ratio: 4.3 (calc) (ref <5.0)
Triglycerides: 263 mg/dL — ABNORMAL HIGH (ref <150)

## 2020-01-04 LAB — URINALYSIS, MICROSCOPIC (REFLEX)

## 2020-01-04 LAB — SARS CORONAVIRUS 2 (TAT 6-24 HRS): SARS Coronavirus 2: NEGATIVE

## 2020-01-04 LAB — SODIUM, URINE, RANDOM: Sodium, Ur: 19 mmol/L

## 2020-01-04 MED ORDER — LISINOPRIL 40 MG PO TABS: 20.0000 mg | ORAL_TABLET | Freq: Every day | ORAL | 0 refills | Status: DC

## 2020-01-04 MED ORDER — LISINOPRIL 20 MG PO TABS: 20.0000 mg | ORAL_TABLET | Freq: Every day | ORAL | 0 refills | Status: DC

## 2020-01-04 MED ORDER — LISINOPRIL 40 MG PO TABS: 40.0000 mg | ORAL_TABLET | Freq: Every day | ORAL | 0 refills | Status: DC

## 2020-01-04 NOTE — ED Provider Notes (Signed)
[facility] EMERGENCY DEPARTMENT Provider Note   CSN: [id number] Arrival date & time: 01/04/20  1044     History Chief Complaint  Patient presents with  . Abnormal Lab    Brandon Quinn is a 54 y.o. male.  55 y.o male with a PMH of HIV, HTN, Gout presents to the ED with a chief complaint of hyponatremia. Patient was seen by PCP yesterday and had blood drawn, he was called this morning with a critical result of a sodium of 122.  Patient reports PCP has been trying to manage his blood pressure for the past 2 years, he has been on dual therapy for 2 years.  He recently had a new medication change which she reports was an increase in his lisinopril.  Patient reports for the past month he has had any increase in urination, states he is unable to sleep through the night as he wakes up constantly to urinate.  He also endorses elevated blood pressures at home despite of medication compliance.  Patient is currently drinking a sixpack or more of beer daily.  His last drink was this morning.  He also smokes half a pack daily.  He also endorses some increased fatigue.  No chest pain, shortness of breath, headaches or seizures.   The history is provided by the patient and medical records.  Abnormal Lab      Past Medical History:  Diagnosis Date  . Gout   . HIV infection (HCC)   . Hyperlipidemia   . Hypertension   . Substance abuse (HCC)    alcohol   . Tobacco abuse     Patient Active Problem List   Diagnosis Date Noted  . Chronic bronchitis (HCC) 01/03/2020  . Hypertensive emergency 06/02/2017  . Alcohol abuse 06/01/2017  . Pain, dental 07/29/2016  . Cough 07/29/2016  . Rash 06/23/2015  . HIV disease (HCC) 05/18/2015  . Acute gouty arthritis 01/30/2015  . Oral thrush 01/30/2015  . Tobacco abuse 07/30/2012  . Hypertension 07/29/2012    Past Surgical History:  Procedure Laterality Date  . WRIST FRACTURE SURGERY  ~ 2009   right; "put a plate in it" (32/20/2542)        Family History  Problem Relation Age of Onset  . Other Other        No known heart disease    Social History   Tobacco Use  . Smoking status: Current Every Day Smoker    Packs/day: 1.00    Years: 26.00    Pack years: 26.00    Types: Cigarettes  . Smokeless tobacco: Never Used  . Tobacco comment: states he is trying to cut back  Substance Use Topics  . Alcohol use: Yes    Alcohol/week: 5.0 standard drinks    Types: 5 Cans of beer per week    Comment: occassionally   . Drug use: Not Currently    Home Medications Prior to Admission medications   Medication Sig Start Date End Date Taking? Authorizing Provider  aspirin EC 81 MG tablet Take 81 mg by mouth daily.    [provider]  atorvastatin (LIPITOR) 80 MG tablet 1/2 Tablet by mouth daily x 1 week then increase to 1 tablet daily 08/27/19   Sharon Seller, NP  emtricitabine-rilpivir-tenofovir AF (ODEFSEY) 200-25-25 MG TABS tablet Take 1 tablet by mouth daily. 07/20/19   Cliffton Asters, MD  hydrochlorothiazide (HYDRODIURIL) 50 MG tablet TAKE 1 TABLET(50 MG) BY MOUTH DAILY 09/28/19   Sharon Seller, NP  lisinopril (ZESTRIL) 20 MG tablet Appointment OVERDUE 1 by mouth daily 11/24/19   Sharon Seller, NP  metoprolol tartrate (LOPRESSOR) 25 MG tablet Take 1 tablet (25 mg total) by mouth 2 (two) times daily. 01/03/20   Sharon Seller, NP    Allergies    Darrin Luis [elviteg-cobic-emtricit-tenofaf]  Review of Systems   Review of Systems  Constitutional: Negative for fever.  HENT: Negative for sore throat.   Respiratory: Negative for shortness of breath.   Cardiovascular: Negative for chest pain.  Gastrointestinal: Negative for abdominal pain, nausea and vomiting.  Genitourinary: Positive for frequency.  Musculoskeletal: Negative for myalgias.  Skin: Negative for pallor and wound.  Neurological: Negative for seizures and headaches.  Psychiatric/Behavioral: Negative for confusion. The patient is not  nervous/anxious.   All other systems reviewed and are negative.   Physical Exam Updated Vital Signs BP (!) 188/83 (BP Location: Right Arm)   Pulse 84   Temp 97.7 F (36.5 C) (Oral)   Resp 16   Ht 5\' 9"  (1.753 m)   Wt 93.9 kg   SpO2 100%   BMI 30.57 kg/m   Physical Exam Vitals and nursing note reviewed.  Constitutional:      Appearance: He is well-developed.  HENT:     Head: Normocephalic and atraumatic.  Eyes:     General: No scleral icterus.    Pupils: Pupils are equal, round, and reactive to light.  Cardiovascular:     Heart sounds: Normal heart sounds.  Pulmonary:     Effort: Pulmonary effort is normal.     Breath sounds: Normal breath sounds. No wheezing.  Chest:     Chest wall: No tenderness.  Abdominal:     General: Bowel sounds are normal. There is no distension.     Palpations: Abdomen is soft.     Tenderness: There is no abdominal tenderness.  Musculoskeletal:        General: No tenderness or deformity.     Cervical back: Normal range of motion.  Skin:    General: Skin is warm and dry.  Neurological:     Mental Status: He is alert and oriented to person, place, and time.     Comments: Alert, oriented, thought content appropriate. Speech fluent without evidence of aphasia. Able to follow 2 step commands without difficulty.  Cranial Nerves:  II:  Peripheral visual fields grossly normal, pupils, round, reactive to light III,IV, VI: ptosis not present, extra-ocular motions intact bilaterally  V,VII: smile symmetric, facial light touch sensation equal VIII: hearing grossly normal bilaterally  IX,X: midline uvula rise  XI: bilateral shoulder shrug equal and strong XII: midline tongue extension  Motor:  5/5 in upper and lower extremities bilaterally including strong and equal grip strength and dorsiflexion/plantar flexion Sensory: light touch normal in all extremities.  Cerebellar: normal finger-to-nose with bilateral upper extremities, pronator drift  negative Gait: normal gait and balance      ED Results / Procedures / Treatments   Labs (all labs ordered are listed, but only abnormal results are displayed) Labs Reviewed  CBC WITH DIFFERENTIAL/PLATELET - Abnormal; Notable for the following components:      Result Value   HCT 38.8 (*)    All other components within normal limits  URINALYSIS, ROUTINE W REFLEX MICROSCOPIC - Abnormal; Notable for the following components:   Hgb urine dipstick TRACE (*)    All other components within normal limits  COMPREHENSIVE METABOLIC PANEL - Abnormal; Notable for the following components:   Sodium 124 (*)  Chloride 88 (*)    Glucose, Bld 120 (*)    All other components within normal limits  URINALYSIS, MICROSCOPIC (REFLEX) - Abnormal; Notable for the following components:   Bacteria, UA RARE (*)    All other components within normal limits  SARS CORONAVIRUS 2 (TAT 6-24 HRS)  SODIUM, URINE, RANDOM    EKG None  Radiology DG Chest 2 View  Result Date: 01/04/2020 CLINICAL DATA:  Hypernatremia. EXAM: CHEST - 2 VIEW COMPARISON:  Single-view of the chest 11/08/2018. FINDINGS: Lungs clear. Heart size normal. No pneumothorax or pleural fluid. No acute or focal bony abnormality IMPRESSION: Negative chest. Electronically Signed   By: Inge Rise M.D.   On: 01/04/2020 13:33    Procedures Procedures (including critical care time)  Medications Ordered in ED Medications - No data to display  ED Course  I have reviewed the triage vital signs and the nursing notes.  Pertinent labs & imaging results that were available during my care of the patient were reviewed by me and considered in my medical decision making (see chart for details).    MDM Rules/Calculators/A&P   Patient the past medical history of hypertension, HIV presents to the ED and then by PCP for critical lab result.  Patient had blood drawn yesterday, was called with a critical sodium of 122.  Patient reports he felt slightly  more weak and fatigued for the past week.  He also reports increase in urination for the past month where he has been unable to sleep through the night due to having to void.  Patient does report compliance with blood pressure medication, reports his blood pressure has still remain 401U in the systolics upon waking up.  Of note, patient is currently drinking more than a sixpack of beer daily.  He also smokes a half a pack of cigarettes a day.  During my evaluation he is overall well-appearing, denies any headaches, seizures.  Neuro exam is reassuring.  Blood pressure is slightly elevated today at 272 systolic over 83, heart rate is within normal limits.  Statting at 100% on room air.  Will obtain repeat labs for symptomatic hyponatremia.   Interpretation of labs by me, CBC with no leukocytosis, no signs of anemia. CMP repeat today is 124, with mild improvement from yesterday's 122 critically low. Creatine is within normal limits, LFTs are unremarkable, although patient does report daily alcohol drinking.  UA with trace of hemoglobin although no nitrites or leukocytes.  Denies any urinary symptoms on today's visit. Urine sodium has been ordered as patient reports   Last CD4 count on October 2020 was 461.Consider pneumonia infection will obtain xray to further evaluate.  DG chest xray without any acute consolidation.   Call placed for hospitalist admisison.   Spoke to Dr. Lorin Mercy hospitalist who recommended consultation with PCP as sodium has now improved today.  2:26 PM Spoke to [place] NP who reported patient is non compliant with following up for his appointments.  However, given reassuring work-up will need to have his lisinopril increased from 20 mg daily to 20 mg twice daily.  He is to follow-up with her in office on Friday for repeat blood work.  I have discussed patient with Dr. Regenia Skeeter who agrees with plan and management.   Portions of this note were generated with Lobbyist.  Dictation errors may occur despite best attempts at proofreading.  Final Clinical Impression(s) / ED Diagnoses Final diagnoses:  Hyponatremia    Rx / DC Orders ED Discharge Orders  None       Claude Manges, PA-C 01/04/20 1446    [name], MD 01/06/20 [phone]

## 2020-01-04 NOTE — ED Triage Notes (Signed)
Sent by pcp for low sodium of 122  Pt states feels like circulation is alittle different in rt hand compared to left hand

## 2020-01-04 NOTE — Telephone Encounter (Signed)
The patient is in the ED at [facility]. The PA there would like to talk with you. Valetta Mole [phone].

## 2020-01-04 NOTE — Telephone Encounter (Signed)
I called the PA in the ED and HCTZ was supposed to be stopped and discussed increase in lisinopril to 40 mg daily, I was under the impression they were going to send the new Rx in, also to get BMP on Friday to follow up sodium levels

## 2020-01-04 NOTE — Telephone Encounter (Signed)
Pt walked in [facility] today after being in the ED, pt was confused about getting labwork as discussed at the ED. rx for lisinopril sent to the pharmacy.

## 2020-01-04 NOTE — ED Notes (Signed)
Saltine crackers, peanut butter and Sprite provided to pt

## 2020-01-04 NOTE — Telephone Encounter (Signed)
Corrected the lisinopril at the pharmacy, and FYI we are closed on Friday for the holiday, I advised pt to follow-up on Monday.

## 2020-01-04 NOTE — Progress Notes (Signed)
Sodium critically low, called pt and instructed to go to the ED at this time for further evaluation.  Educated pt that he will need to STOP HCTZ but to have the ED providers advise based on their workup

## 2020-01-04 NOTE — Discharge Instructions (Addendum)
STOP HCTZ  GO see Abbey Chatters NP office Friday to have blood work drawn.  Increase your Lisinopril 20 mg twice a day, instead of taking this daily.

## 2020-01-04 NOTE — Progress Notes (Signed)
[facility] to [facility] transfer - deferred for now.  Patient with h/o ETOH dependence; HIV; HTN; HLD; and tobacco dependence presenting with hyponatremia, sent by PCP for evaluation.  He saw his PCP yesterday and was found to have a sodium of 122.  Increased urination, fatigue, poor BP control. Na++ is 124.  Drinks >6 beers daily.  He is hemodynamically stable.  CXR ok.  Na++ was 130 on 12/04/2017.    In further discussion, it is not entirely clear that this patient requires inpatient evaluation at this time.  If close outpatient f/u is available, this may be reasonable.  He appears to have beer potomania; labs are slightly better than prior and so is BP; and he needs to stop drinking.  As such, I have recommended that the EDP contact his PCP and attempt outpatient evaluation and treatment with close outpatient monitoring.  After discussion with his PCP, this is considered reasonable at this time and so the admission will be deferred for now.   Georgana Curio, M.D.

## 2020-01-04 NOTE — Telephone Encounter (Signed)
Noted due to holiday, we will follow up Monday, thank you.

## 2020-01-10 ENCOUNTER — Ambulatory Visit: Payer: Self-pay | Admitting: Nurse Practitioner

## 2020-01-12 ENCOUNTER — Ambulatory Visit: Payer: Self-pay | Admitting: Nurse Practitioner

## 2020-01-12 ENCOUNTER — Telehealth: Payer: Self-pay | Admitting: Nurse Practitioner

## 2020-01-12 DIAGNOSIS — I361 Nonrheumatic tricuspid (valve) insufficiency: Secondary | ICD-10-CM

## 2020-01-12 DIAGNOSIS — I351 Nonrheumatic aortic (valve) insufficiency: Secondary | ICD-10-CM

## 2020-01-12 DIAGNOSIS — I160 Hypertensive urgency: Secondary | ICD-10-CM

## 2020-01-12 DIAGNOSIS — R778 Other specified abnormalities of plasma proteins: Secondary | ICD-10-CM

## 2020-01-12 DIAGNOSIS — B20 Human immunodeficiency virus [HIV] disease: Secondary | ICD-10-CM

## 2020-01-12 DIAGNOSIS — F10929 Alcohol use, unspecified with intoxication, unspecified: Secondary | ICD-10-CM

## 2020-01-12 NOTE — Telephone Encounter (Signed)
Called patient today 01/12/2020 because patient missed appt. On 04/05 and today.  Patient states he is in the hospital

## 2020-01-14 ENCOUNTER — Telehealth: Payer: Self-pay | Admitting: *Deleted

## 2020-01-14 NOTE — Telephone Encounter (Signed)
I have made the 1st attempt to contact the patient or family member in charge, in order to follow up from recently being discharged from the hospital. I left a message on voicemail but I will make another attempt at a different time.  

## 2020-01-17 NOTE — Telephone Encounter (Signed)
I have made the 2nd attempt to contact the patient or family member in charge, in order to follow up from recently being discharged from the hospital. I left a message on voicemail but I will make another attempt at a different time.  

## 2020-01-18 NOTE — Telephone Encounter (Signed)
I have made the 3rd attempt to contact the patient or family member in charge, in order to follow up from recently being discharged from the hospital. I left a message on voicemail but I will make another attempt at a different time.  

## 2020-01-19 NOTE — Telephone Encounter (Signed)
Transition Care Management Follow-up Telephone Call  Date of discharge and from where: [phone] [facility]  How have you been since you were released from the hospital? Better but still having trouble with blood pressure going up. Has not picked up medication from pharmacy yet. Stated that his Sodium level is 120.   Any questions or concerns? No  Stated that he has no insurance.   Items Reviewed:  Did the pt receive and understand the discharge instructions provided? Yes   Medications obtained and verified? Yes  but patient does not know what was changed concerning medication because he has not picked up from pharmacy yet. Stated that he does not have insurance nor money.   Any new allergies since your discharge? No   Dietary orders reviewed? Yes  Do you have support at home? Yes   Other (ie: DME, Home Health, etc) No Home Health  Functional Questionnaire: (I = Independent and D = Dependent) ADL's: I  Bathing/Dressing- I   Meal Prep- I  Eating- I  Maintaining continence- I  Transferring/Ambulation- I  Managing Meds- I   Follow up appointments reviewed:    PCP Hospital f/u appt confirmed? Yes  Scheduled to see [name] on 01/24/20.  Specialist Hospital f/u appt confirmed? No    Are transportation arrangements needed? No   If their condition worsens, is the pt aware to call  their PCP or go to the ED? Yes  Was the patient provided with contact information for the PCP's office or ED? Yes  Was the pt encouraged to call back with questions or concerns? Yes

## 2020-01-24 ENCOUNTER — Encounter: Payer: Self-pay | Admitting: Nurse Practitioner

## 2020-01-25 ENCOUNTER — Encounter: Payer: Self-pay | Admitting: Internal Medicine

## 2020-01-31 ENCOUNTER — Encounter (HOSPITAL_BASED_OUTPATIENT_CLINIC_OR_DEPARTMENT_OTHER): Payer: Self-pay

## 2020-01-31 ENCOUNTER — Emergency Department (HOSPITAL_BASED_OUTPATIENT_CLINIC_OR_DEPARTMENT_OTHER)
Admission: EM | Admit: 2020-01-31 | Discharge: 2020-01-31 | Disposition: A | Discharge status: Home / Self Care | Payer: Self-pay | Attending: Emergency Medicine | Admitting: Emergency Medicine

## 2020-01-31 ENCOUNTER — Other Ambulatory Visit: Payer: Self-pay

## 2020-01-31 DIAGNOSIS — I10 Essential (primary) hypertension: Secondary | ICD-10-CM | POA: Insufficient documentation

## 2020-01-31 DIAGNOSIS — M10021 Idiopathic gout, right elbow: Secondary | ICD-10-CM | POA: Insufficient documentation

## 2020-01-31 DIAGNOSIS — F17210 Nicotine dependence, cigarettes, uncomplicated: Secondary | ICD-10-CM | POA: Insufficient documentation

## 2020-01-31 DIAGNOSIS — E785 Hyperlipidemia, unspecified: Secondary | ICD-10-CM | POA: Insufficient documentation

## 2020-01-31 DIAGNOSIS — M7021 Olecranon bursitis, right elbow: Secondary | ICD-10-CM | POA: Insufficient documentation

## 2020-01-31 DIAGNOSIS — B20 Human immunodeficiency virus [HIV] disease: Secondary | ICD-10-CM | POA: Insufficient documentation

## 2020-01-31 DIAGNOSIS — M109 Gout, unspecified: Secondary | ICD-10-CM

## 2020-01-31 DIAGNOSIS — Y939 Activity, unspecified: Secondary | ICD-10-CM | POA: Insufficient documentation

## 2020-01-31 MED ORDER — COLCHICINE-PROBENECID 0.5-500 MG PO TABS: 1.0000 | ORAL_TABLET | Freq: Two times a day (BID) | ORAL | 0 refills | Status: DC

## 2020-01-31 MED ORDER — HYDROCODONE-ACETAMINOPHEN 5-325 MG PO TABS: 1.0000 | ORAL_TABLET | Freq: Once | ORAL | Status: DC

## 2020-01-31 NOTE — ED Triage Notes (Signed)
Pt c/o pain/swelling to right elbow x 3 days-denies injury-reports hx of gout-NAD-steady gait

## 2020-01-31 NOTE — ED Notes (Signed)
Pt states he drove his self and wants ot drive home. MD aware Vocodin d/c

## 2020-01-31 NOTE — Discharge Instructions (Addendum)
You were evaluated in the Emergency Department and after careful evaluation, we did not find any emergent condition requiring admission or further testing in the hospital.  Your exam/testing today was overall reassuring.  Your symptoms seem to be due to gout.  Take the colchicine medication as directed and follow-up with your regular doctor.  Please return to the Emergency Department if you experience any worsening of your condition.  We encourage you to follow up with a primary care provider.  Thank you for allowing [place] to be a part of your care.

## 2020-01-31 NOTE — ED Notes (Signed)
ED Provider at bedside. 

## 2020-01-31 NOTE — ED Provider Notes (Signed)
[facility] Emergency Department Provider Note MRN:  [id number]  Arrival date & time: 01/31/20     Chief Complaint   Elbow Pain   History of Present Illness   Brandon Quinn is a 54 y.o. year-old male with a history of gout, HIV presenting to the ED with chief complaint of elbow pain.  Explains that he has symptoms consistent with prior flares of gout.  Pain to his right elbow.  Pain mild to moderate, worse with motion, trouble sleeping due to the pain.  No fever, no trauma, no other complaints.  Review of Systems  A complete 10 system review of systems was obtained and all systems are negative except as noted in the HPI and PMH.   Patient's Health History    Past Medical History:  Diagnosis Date  . Gout   . HIV infection (HCC)   . Hyperlipidemia   . Hypertension   . Substance abuse (HCC)    alcohol   . Tobacco abuse     Past Surgical History:  Procedure Laterality Date  . WRIST FRACTURE SURGERY  ~ 2009   right; "put a plate in it" (09/47/0962)    Family History  Problem Relation Age of Onset  . Other Other        No known heart disease    Social History   Socioeconomic History  . Marital status: Divorced    Spouse name: Not on file  . Number of children: Not on file  . Years of education: Not on file  . Highest education level: Not on file  Occupational History  . Occupation: Event organiser: [employer]  Tobacco Use  . Smoking status: Current Every Day Smoker    Packs/day: 1.00    Years: 26.00    Pack years: 26.00    Types: Cigarettes  . Smokeless tobacco: Never Used  Substance and Sexual Activity  . Alcohol use: Not Currently  . Drug use: Not Currently  . Sexual activity: Not on file  Other Topics Concern  . Not on file  Social History Narrative   Social History      Diet? none      Do you drink/eat things with caffeine? no      Marital status?           divorced                         What year  were you married?      Do you live in a house, apartment, assisted living, condo, trailer, etc.? no      Is it one or more stories?      How many persons live in your home? myself      Do you have any pets in your home? (please list) no      Highest level of education completed? Masters      Current or past profession: Geophysicist/field seismologist      Do you exercise?            sometimes                          Type & how often? Walk everyday      Advanced Directives      Do you have a living will? no      Do you have a DNR form?  no                        If not, do you want to discuss one? yes      Do you have signed POA/HPOA for forms?       Functional Status      Do you have difficulty bathing or dressing yourself?      Do you have difficulty preparing food or eating?       Do you have difficulty managing your medications?      Do you have difficulty managing your finances?      Do you have difficulty affording your medications?      Social Determinants of Health   Financial Resource Strain:   . Difficulty of Paying Living Expenses:   Food Insecurity:   . Worried About Programme researcher, broadcasting/film/video in the Last Year:   . Barista in the Last Year:   Transportation Needs:   . Freight forwarder (Medical):   Marland Kitchen Lack of Transportation (Non-Medical):   Physical Activity:   . Days of Exercise per Week:   . Minutes of Exercise per Session:   Stress:   . Feeling of Stress :   Social Connections:   . Frequency of Communication with Friends and Family:   . Frequency of Social Gatherings with Friends and Family:   . Attends Religious Services:   . Active Member of Clubs or Organizations:   . Attends Banker Meetings:   Marland Kitchen Marital Status:   Intimate Partner Violence:   . Fear of Current or Ex-Partner:   . Emotionally Abused:   Marland Kitchen Physically Abused:   . Sexually Abused:      Physical Exam   Vitals:   01/31/20 1209  BP: (!) 207/96    Pulse: 77  Resp: 18  Temp: 98.5 F (36.9 C)  SpO2: 100%    CONSTITUTIONAL: Well-appearing, NAD NEURO:  Alert and oriented x 3, no focal deficits EYES:  eyes equal and reactive ENT/NECK:  no LAD, no JVD CARDIO: Regular rate, well-perfused, normal S1 and S2 PULM:  CTAB no wheezing or rhonchi GI/GU:  normal bowel sounds, non-distended, non-tender MSK/SPINE:  No gross deformities, mild swelling noted to the right olecranon bursa with tenderness to palpation, preserved range of motion of the right elbow SKIN:  no rash, atraumatic PSYCH:  Appropriate speech and behavior  *Additional and/or pertinent findings included in MDM below  Diagnostic and Interventional Summary    EKG Interpretation  Date/Time:    Ventricular Rate:    PR Interval:    QRS Duration:   QT Interval:    QTC Calculation:   R Axis:     Text Interpretation:        Labs Reviewed - No data to display  No orders to display    Medications - No data to display   Procedures  /  Critical Care Procedures  ED Course and Medical Decision Making  I have reviewed the triage vital signs, the nursing notes, and pertinent available records from the EMR.  Listed above are laboratory and imaging tests that I personally ordered, reviewed, and interpreted and then considered in my medical decision making (see below for details).      History and exam are consistent with gouty arthritis of the bursa.  Preserved range of motion of the joint, nothing to suggest intra-articular gout or septic joint.  Reassuring vital signs, hypertensive but explains that  he gets very anxious in the emergency department.  Appropriate for discharge on colchicine.    Barth Kirks. Sedonia Small, [place] [email or web address]  Final Clinical Impressions(s) / ED Diagnoses     ICD-10-CM   1. Gouty bursitis of right olecranon  M10.9     ED Discharge Orders         Ordered    colchicine-probenecid  0.5-500 MG tablet  2 times daily     01/31/20 1337           Discharge Instructions Discussed with and Provided to Patient:     Discharge Instructions     You were evaluated in the Emergency Department and after careful evaluation, we did not find any emergent condition requiring admission or further testing in the hospital.  Your exam/testing today was overall reassuring.  Your symptoms seem to be due to gout.  Take the colchicine medication as directed and follow-up with your regular doctor.  Please return to the Emergency Department if you experience any worsening of your condition.  We encourage you to follow up with a primary care provider.  Thank you for allowing [place] to be a part of your care.       Maudie Flakes, MD 01/31/20 1343

## 2020-02-01 ENCOUNTER — Encounter: Payer: Self-pay | Admitting: Family

## 2020-02-01 ENCOUNTER — Ambulatory Visit (INDEPENDENT_AMBULATORY_CARE_PROVIDER_SITE_OTHER): Payer: Self-pay | Admitting: Family

## 2020-02-01 VITALS — BP 210/102 | HR 86 | Temp 97.3°F | Ht 69.0 in | Wt 206.4 lb

## 2020-02-01 DIAGNOSIS — I10 Essential (primary) hypertension: Secondary | ICD-10-CM

## 2020-02-01 DIAGNOSIS — M109 Gout, unspecified: Secondary | ICD-10-CM

## 2020-02-01 LAB — CBC WITH DIFFERENTIAL/PLATELET
Absolute Monocytes: 764 cells/uL (ref 200–950)
Basophils Absolute: 47 cells/uL (ref 0–200)
Basophils Relative: 0.6 %
Eosinophils Absolute: 86 cells/uL (ref 15–500)
Eosinophils Relative: 1.1 %
HCT: 38.8 % (ref 38.5–50.0)
Hemoglobin: 12.9 g/dL — ABNORMAL LOW (ref 13.2–17.1)
Lymphs Abs: 3190 cells/uL (ref 850–3900)
MCH: 29.7 pg (ref 27.0–33.0)
MCHC: 33.2 g/dL (ref 32.0–36.0)
MCV: 89.4 fL (ref 80.0–100.0)
MPV: 10.7 fL (ref 7.5–12.5)
Monocytes Relative: 9.8 %
Neutro Abs: 3713 cells/uL (ref 1500–7800)
Neutrophils Relative %: 47.6 %
Platelets: 233 10*3/uL (ref 140–400)
RBC: 4.34 10*6/uL (ref 4.20–5.80)
RDW: 13 % (ref 11.0–15.0)
Total Lymphocyte: 40.9 %
WBC: 7.8 10*3/uL (ref 3.8–10.8)

## 2020-02-01 LAB — BASIC METABOLIC PANEL WITH GFR
BUN: 12 mg/dL (ref 7–25)
CO2: 24 mmol/L (ref 20–32)
Calcium: 9.1 mg/dL (ref 8.6–10.3)
Chloride: 105 mmol/L (ref 98–110)
Creat: 0.88 mg/dL (ref 0.70–1.33)
GFR, Est African American: 111 mL/min/{1.73_m2} (ref >=60)
GFR, Est Non African American: 96 mL/min/{1.73_m2} (ref >=60)
Glucose, Bld: 105 mg/dL — ABNORMAL HIGH (ref 65–99)
Potassium: 4.1 mmol/L (ref 3.5–5.3)
Sodium: 138 mmol/L (ref 135–146)

## 2020-02-01 MED ORDER — METOPROLOL TARTRATE 37.5 MG PO TABS: 37.5000 mg | ORAL_TABLET | Freq: Two times a day (BID) | ORAL | 1 refills | Status: DC

## 2020-02-01 NOTE — Progress Notes (Signed)
Provider: Shamela Haydon FNP-C  Lauree Chandler, NP  Patient Care Team: Lauree Chandler, NP as PCP - General (Geriatric Medicine) Michel Bickers, MD as Consulting Physician (Infectious Diseases)  Extended Emergency Contact Information Primary Emergency Contact: Salifou,Garba Address: [address]          [place], [place] [address] [place] Phone: [phone] Relation: Brother Secondary Emergency Contact: Kone,Aidriss  [place] Phone: [phone] Relation: Friend  Code Status: Full Code  Goals of care: Advanced Directive information Advanced Directives 02/01/2020  Does Patient Have a Medical Advance Directive? No  Would patient like information on creating a medical advance directive? -     Chief Complaint  Patient presents with  . Acute Visit    High BP Issues.  . Health Maintenance    Discuss the need for Colonoscopy.   . Immunizations    Discuss the need for Tetanus Vaccine,     HPI:  Pt is a 54 y.o. male seen today for an acute visit for evaluation of high blood pressure.He was seen in the ED yesterday 01/31/2020 for right gouty arthritis of the bursa.Colchicine was prescribed.He states went to pharmacy yesterday to pick up colchicine but was told to return 02/01/2020  medication will be ordered.He states heading to pharmacy after visit today.Has taken Ibuprofen 800 mg tablet three times and Aleve about six times.I've cautioned him from taking Aleve and ibuprofen at the same time since both are similar.He verbalized understanding.His B/p was 207/96 in the ED thought he was nervous in the ED.His blood pressure continues to be high.brought in his log B/p ranging in the 190's/90's - 200's/100's for the past 5 days.He states has had pain on his right elbow for the past 5 days. He request his COVID-19 test results done 01/04/2020 at the hospital.      Past Medical History:  Diagnosis Date  . Gout   . HIV infection ([place])    . Hyperlipidemia   . Hypertension   . Substance abuse ([place])    alcohol   . Tobacco abuse    Past Surgical History:  Procedure Laterality Date  . WRIST FRACTURE SURGERY  ~ 2009   right; "put a plate in it" (19/37/9024)    Allergies  Allergen Reactions  . Genvoya [Elviteg-Cobic-Emtricit-Tenofaf] Rash    Outpatient Encounter Medications as of 02/01/2020  Medication Sig  . atorvastatin (LIPITOR) 80 MG tablet 1/2 Tablet by mouth daily x 1 week then increase to 1 tablet daily  . colchicine-probenecid 0.5-500 MG tablet Take 1 tablet by mouth 2 (two) times daily for 7 days.  Marland Kitchen emtricitabine-rilpivir-tenofovir AF (ODEFSEY) 200-25-25 MG TABS tablet Take 1 tablet by mouth daily.  . metoprolol tartrate (LOPRESSOR) 25 MG tablet Take 1 tablet (25 mg total) by mouth 2 (two) times daily.  . [DISCONTINUED] aspirin EC 81 MG tablet Take 81 mg by mouth daily.   No facility-administered encounter medications on file as of 02/01/2020.    Review of Systems  Constitutional: Negative for appetite change, chills, fatigue and fever.  HENT: Negative for congestion, rhinorrhea, sinus pressure, sinus pain, sneezing and sore throat.   Eyes: Positive for visual disturbance. Negative for pain, discharge, redness and itching.       Blurry vision sometimes when B/p is high   Respiratory: Negative for cough, chest tightness, shortness of breath and wheezing.   Cardiovascular: Negative for chest pain, palpitations and leg swelling.  Gastrointestinal: Negative for abdominal distention, abdominal pain, constipation,  diarrhea, nausea and vomiting.  Genitourinary: Negative for difficulty urinating, dysuria, flank pain, frequency and urgency.  Musculoskeletal: Positive for arthralgias. Negative for gait problem and myalgias.       Right elbow pain better today took ibuprofen and aleve.swelling has gone down.   Skin: Negative for color change, pallor and rash.  Neurological: Negative for dizziness, seizures, speech  difficulty, weakness, light-headedness, numbness and headaches.  Psychiatric/Behavioral: Negative for agitation, behavioral problems and sleep disturbance. The patient is not nervous/anxious.     Immunization History  Administered Date(s) Administered  . Influenza,inj,Quad PF,6+ Mos 06/27/2015, 07/29/2016, 07/20/2019  . PPD Test 04/25/2015  . Pneumococcal Polysaccharide-23 04/25/2015   Pertinent  Health Maintenance Due  Topic Date Due  . COLONOSCOPY  Never done  . INFLUENZA VACCINE  05/07/2020   Fall Risk  02/01/2020 01/03/2020 08/27/2019 07/20/2019 02/19/2018  Falls in the past year? 0 0 0 0 Yes  Comment - - - - -  Number falls in past yr: 0 0 0 - -  Injury with Fall? 0 0 0 - No  Comment - - - - -  Follow up - - - Falls evaluation completed -    Vitals:   02/01/20 1430  BP: (!) 210/102  Pulse: 86  Temp: (!) 97.3 F (36.3 C)  SpO2: 98%  Weight: 206 lb 6.4 oz (93.6 kg)  Height: 5' 9"  (1.753 m)   Body mass index is 30.48 kg/m. Physical Exam Vitals reviewed.  Constitutional:      General: He is not in acute distress.    Appearance: He is obese. He is not ill-appearing.  HENT:     Head: Normocephalic.     Mouth/Throat:     Mouth: Mucous membranes are moist.     Pharynx: Oropharynx is clear. No oropharyngeal exudate or posterior oropharyngeal erythema.  Eyes:     General: No scleral icterus.       Right eye: No discharge.        Left eye: No discharge.     Extraocular Movements: Extraocular movements intact.     Conjunctiva/sclera: Conjunctivae normal.     Pupils: Pupils are equal, round, and reactive to light.  Neck:     Vascular: No carotid bruit.  Cardiovascular:     Rate and Rhythm: Normal rate.     Pulses: Normal pulses.     Heart sounds: Normal heart sounds. No murmur. No friction rub. No gallop.   Pulmonary:     Effort: Pulmonary effort is normal. No respiratory distress.     Breath sounds: Normal breath sounds. No wheezing, rhonchi or rales.  Chest:      Chest wall: No tenderness.  Abdominal:     General: Bowel sounds are normal. There is no distension.     Palpations: Abdomen is soft. There is no mass.     Tenderness: There is no abdominal tenderness. There is no right CVA tenderness, left CVA tenderness, guarding or rebound.  Musculoskeletal:        General: No swelling.     Right elbow: Swelling present. No effusion. Tenderness present in olecranon process.     Cervical back: Normal range of motion. No rigidity or tenderness.     Right lower leg: No edema.     Left lower leg: No edema.  Lymphadenopathy:     Cervical: No cervical adenopathy.  Skin:    General: Skin is warm and dry.     Coloration: Skin is not pale.     Findings:  No bruising or erythema.  Neurological:     Mental Status: He is alert and oriented to person, place, and time.     Cranial Nerves: No cranial nerve deficit.     Sensory: No sensory deficit.     Motor: No weakness.     Coordination: Coordination normal.     Gait: Gait normal.  Psychiatric:        Mood and Affect: Mood normal.        Behavior: Behavior normal.        Thought Content: Thought content normal.        Judgment: Judgment normal.     Labs reviewed: Recent Labs    01/03/20 1405 01/04/20 1153  NA 122* 124*  K 3.5 3.6  CL 87* 88*  CO2 24 25  GLUCOSE 121* 120*  BUN 8 9  CREATININE 0.75 0.74  CALCIUM 9.2 9.0   Recent Labs    01/03/20 1405 01/04/20 1153  AST 29 33  ALT 21 26  ALKPHOS  --  77  BILITOT 0.4 1.0  PROT 7.6 7.7  ALBUMIN  --  3.8   Recent Labs    01/03/20 1405 01/04/20 1153  WBC 6.5 4.6  NEUTROABS 2,386 2.4  HGB 13.6 13.7  HCT 39.0 38.8*  MCV 88.4 86.0  PLT 262 247   Lab Results  Component Value Date   TSH 0.715 07/29/2012   Lab Results  Component Value Date   HGBA1C 5.9 (H) 02/27/2018   Lab Results  Component Value Date   CHOL 228 (H) 01/03/2020   HDL 53 01/03/2020   LDLCALC 135 (H) 01/03/2020   TRIG 263 (H) 01/03/2020   CHOLHDL 4.3 01/03/2020      Significant Diagnostic Results in last 30 days:  DG Chest 2 View  Result Date: 01/04/2020 CLINICAL DATA:  Hypernatremia. EXAM: CHEST - 2 VIEW COMPARISON:  Single-view of the chest 11/08/2018. FINDINGS: Lungs clear. Heart size normal. No pneumothorax or pleural fluid. No acute or focal bony abnormality IMPRESSION: Negative chest. Electronically Signed   By: Inge Rise M.D.   On: 01/04/2020 13:33    Assessment/Plan  1. Uncontrolled hypertension B/p readings in the 190's/90's -200's /100's for the past five days baseline SBP 140's prior to having right elbow gout though on chart review he has had uncontrolled blood pressure. - clonidine 0.1 mg tablet x 1 dose administered today without any improvement. - he would like to pick his gout medication from pharmacy thinks B/p high due to his pain. - Advised to increase metoprolol from 25 mg tablet twice daily to 37.5 mg tablet twice daily verbalized understanding.will take first dose today. - CBC with Differential/Platelet - BMP with eGFR([facility]) - metoprolol tartrate 37.5 MG TABS; Take 37.5 mg by mouth 2 (two) times daily.  Dispense: 60 tablet; Refill: 1 - Advised to got to ED or call EMS if he develops any weakness,slurred speech,dizziness,blurry vision,palpitation,chest tightness or pain.verbalized understanding.  - advised to check blood pressure twice daily and record then follow up in 3 days for evaluation.   2. Acute gouty arthritis - colchicine ordered in the ED yesterday.Pharmacy did not have medication yesterday but was told to pick up today.  Family/ staff Communication: Reviewed plan of care with patient  Labs/tests ordered:  - CBC with Differential/Platelet - BMP with eGFR([facility])  Next Appointment: 3 days for blood pressure evaluation.   Sandrea Hughs, NP

## 2020-02-01 NOTE — Patient Instructions (Signed)
Take Metoprolol 37.5 mg tablet one by mouth twice daily  - check Blood pressure twice daily and record bring log to visit on 02/04/2020 for evaluation

## 2020-02-04 ENCOUNTER — Other Ambulatory Visit: Payer: Self-pay

## 2020-02-04 ENCOUNTER — Telehealth: Payer: Self-pay | Admitting: *Deleted

## 2020-02-04 ENCOUNTER — Ambulatory Visit (INDEPENDENT_AMBULATORY_CARE_PROVIDER_SITE_OTHER): Payer: Self-pay | Admitting: Nurse Practitioner

## 2020-02-04 ENCOUNTER — Encounter: Payer: Self-pay | Admitting: Nurse Practitioner

## 2020-02-04 VITALS — BP 190/92 | HR 88 | Temp 97.9°F | Ht 69.0 in | Wt 206.0 lb

## 2020-02-04 DIAGNOSIS — E871 Hypo-osmolality and hyponatremia: Secondary | ICD-10-CM

## 2020-02-04 DIAGNOSIS — I10 Essential (primary) hypertension: Secondary | ICD-10-CM

## 2020-02-04 DIAGNOSIS — F1010 Alcohol abuse, uncomplicated: Secondary | ICD-10-CM

## 2020-02-04 DIAGNOSIS — M109 Gout, unspecified: Secondary | ICD-10-CM

## 2020-02-04 MED ORDER — LISINOPRIL 20 MG PO TABS: 20.0000 mg | ORAL_TABLET | Freq: Every day | ORAL | 0 refills | Status: DC

## 2020-02-04 MED ORDER — METOPROLOL SUCCINATE ER 50 MG PO TB24: 50.0000 mg | ORAL_TABLET | Freq: Two times a day (BID) | ORAL | 1 refills | Status: DC

## 2020-02-04 MED ORDER — METOPROLOL SUCCINATE ER 50 MG PO TB24: 50.0000 mg | ORAL_TABLET | Freq: Every day | ORAL | 1 refills | Status: DC

## 2020-02-04 NOTE — Progress Notes (Signed)
Careteam: Patient Care Team: Lauree Chandler, NP as PCP - General (Geriatric Medicine) Michel Bickers, MD as Consulting Physician (Infectious Diseases)  PLACE OF SERVICE:  [place] Directive information Does Patient Have a Medical Advance Directive?: No, Would patient like information on creating a medical advance directive?: No - Patient declined  Allergies  Allergen Reactions  . Genvoya [Elviteg-Cobic-Emtricit-Tenofaf] Rash    Chief Complaint  Patient presents with  . Acute Visit    follow up on BP      HPI: Patient is a 54 y.o. male for follow up on blood pressure. Pt went to ED 01/31/20 with gouty bursitis - he was treated with colchicine-probenecid and has had 4 days and reports swelling is gone and pain is much better. Has colchicine-probenecid BID for total of 7 day course and on day 4 currently.   Blood pressure- continues ONLY on metoprolol 50 mg twice daily. He never started the lisinopril dosing from prior visit. And did not get norvasc 2.5 mg (which was recommended upon discharge from [place] earlier this month) Likes can food, sardines.  Does not add salt  Hyponatremia- improved on recent labs, reports he has stopped drinking ETOH since ED visit for gouty bursitis.    Review of Systems:  Review of Systems  Constitutional: Negative for chills, fever and weight loss.  Respiratory: Negative for cough, sputum production and shortness of breath.   Cardiovascular: Negative for chest pain, palpitations and leg swelling.  Gastrointestinal: Negative for abdominal pain, constipation and diarrhea.  Musculoskeletal: Negative for back pain, joint pain and myalgias.  Skin: Negative.   Neurological: Negative for dizziness, focal weakness, weakness and headaches.    Past Medical History:  Diagnosis Date  . Gout   . HIV infection ([place])   . Hyperlipidemia   . Hypertension   . Substance abuse ([place])    alcohol   . Tobacco abuse    Past  Surgical History:  Procedure Laterality Date  . WRIST FRACTURE SURGERY  ~ 2009   right; "put a plate in it" (20/25/4270)   Social History:   reports that he has been smoking cigarettes. He has a 26.00 pack-year smoking history. He has never used smokeless tobacco. He reports previous alcohol use. He reports previous drug use.  Family History  Problem Relation Age of Onset  . Other Other        No known heart disease    Medications: Patient's Medications  New Prescriptions   No medications on file  Previous Medications   ATORVASTATIN (LIPITOR) 80 MG TABLET    1/2 Tablet by mouth daily x 1 week then increase to 1 tablet daily   COLCHICINE-PROBENECID 0.5-500 MG TABLET    Take 1 tablet by mouth 2 (two) times daily for 7 days.   EMTRICITABINE-RILPIVIR-TENOFOVIR AF (ODEFSEY) 200-25-25 MG TABS TABLET    Take 1 tablet by mouth daily.   METOPROLOL SUCCINATE (TOPROL-XL) 50 MG 24 HR TABLET    Take 1 tablet (50 mg total) by mouth 2 (two) times daily. Take with or immediately following a meal.   METOPROLOL TARTRATE (LOPRESSOR) 50 MG TABLET    Take 50 mg by mouth 2 (two) times daily.  Modified Medications   No medications on file  Discontinued Medications   No medications on file    Physical Exam:  Vitals:   02/04/20 1102  BP: (!) 190/92  Pulse: 88  Temp: 97.9 F (36.6 C)  TempSrc: Temporal  SpO2: 98%  Weight:  206 lb (93.4 kg)  Height: 5\' 9"  (1.753 m)   Body mass index is 30.42 kg/m. Wt Readings from Last 3 Encounters:  02/04/20 206 lb (93.4 kg)  02/01/20 206 lb 6.4 oz (93.6 kg)  01/31/20 208 lb (94.3 kg)    Physical Exam Constitutional:      General: He is not in acute distress.    Appearance: He is well-developed. He is not diaphoretic.  HENT:     Head: Normocephalic and atraumatic.     Mouth/Throat:     Pharynx: No oropharyngeal exudate.  Eyes:     Conjunctiva/sclera: Conjunctivae normal.     Pupils: Pupils are equal, round, and reactive to light.  Cardiovascular:       Rate and Rhythm: Normal rate and regular rhythm.     Heart sounds: Normal heart sounds.  Pulmonary:     Effort: Pulmonary effort is normal.     Breath sounds: Normal breath sounds.  Musculoskeletal:        General: No swelling, tenderness or deformity.     Cervical back: Normal range of motion and neck supple.     Right lower leg: No edema.     Left lower leg: No edema.  Skin:    General: Skin is warm and dry.  Neurological:     Mental Status: He is alert and oriented to person, place, and time.     Labs reviewed: Basic Metabolic Panel: Recent Labs    01/03/20 1405 01/04/20 1153 02/01/20 1525  NA 122* 124* 138  K 3.5 3.6 4.1  CL 87* 88* 105  CO2 24 25 24   GLUCOSE 121* 120* 105*  BUN 8 9 12   CREATININE 0.75 0.74 0.88  CALCIUM 9.2 9.0 9.1   Liver Function Tests: Recent Labs    01/03/20 1405 01/04/20 1153  AST 29 33  ALT 21 26  ALKPHOS  --  77  BILITOT 0.4 1.0  PROT 7.6 7.7  ALBUMIN  --  3.8   No results for input(s): LIPASE, AMYLASE in the last 8760 hours. No results for input(s): AMMONIA in the last 8760 hours. CBC: Recent Labs    01/03/20 1405 01/04/20 1153 02/01/20 1525  WBC 6.5 4.6 7.8  NEUTROABS 2,386 2.4 3,713  HGB 13.6 13.7 12.9*  HCT 39.0 38.8* 38.8  MCV 88.4 86.0 89.4  PLT 262 247 233   Lipid Panel: Recent Labs    01/03/20 1405  CHOL 228*  HDL 53  LDLCALC 135*  TRIG 263*  CHOLHDL 4.3   TSH: No results for input(s): TSH in the last 8760 hours. A1C: Lab Results  Component Value Date   HGBA1C 5.9 (H) 02/27/2018     Assessment/Plan 1. Uncontrolled hypertension -ongoing, blood pressure readings at home 200s/100s. Will add lisnopril to lopressor at this time and educated him on low sodium diet, handout also given.  -he is aware of when to go to ED.  -may need additional norvasc added therefore will follow up in 1 week for recheck.  - lisinopril (ZESTRIL) 20 MG tablet; Take 1 tablet (20 mg total) by mouth daily.  Dispense: 30  tablet; Refill: 0  2. Acute gouty arthritis Improved on colchicine-probenecid, educated on dietary modifications as well. And cessation from ETOH  3. ETOH abuse -reports he has cut back and not drinking since ED visit for acute gout, encouraged to continue with cessation.  4. Hyponatremia -sodium improved on recent labs. Likely due to ETOH abuse and diuretic (which he is currently off at  this time)  Next appt: 1 week for blood pressure check.  Janene Harvey. Biagio Borg  [facility] [phone]

## 2020-02-04 NOTE — Telephone Encounter (Signed)
Received fax from [facility] stating that plan does not cover Metoprolol Tartrate 37.5mg .   Per Dinah--D/C Metoprolol 37.5mg  tablet and Change to 50mg  tablet twice daily orally.   Medication list updated. Fax Request faxed back to [facility].

## 2020-02-04 NOTE — Patient Instructions (Signed)
GOAL BLOOD PRESSURE IS LESS THAN 140/90  To ADD lisinopril 20 mg daily to metoprolol 25 mg twice daily   Continue to record blood pressure AFTER you take medication   DASH Eating Plan DASH stands for "Dietary Approaches to Stop Hypertension." The DASH eating plan is a healthy eating plan that has been shown to reduce high blood pressure (hypertension). It may also reduce your risk for type 2 diabetes, heart disease, and stroke. The DASH eating plan may also help with weight loss. What are tips for following this plan?  General guidelines  Avoid eating more than 2,300 mg (milligrams) of salt (sodium) a day. If you have hypertension, you may need to reduce your sodium intake to 1,500 mg a day.  Limit alcohol intake to no more than 1 drink a day for nonpregnant women and 2 drinks a day for men. One drink equals 12 oz of beer, 5 oz of wine, or 1 oz of hard liquor.  Work with your health care provider to maintain a healthy body weight or to lose weight. Ask what an ideal weight is for you.  Get at least 30 minutes of exercise that causes your heart to beat faster (aerobic exercise) most days of the week. Activities may include walking, swimming, or biking.  Work with your health care provider or diet and nutrition specialist (dietitian) to adjust your eating plan to your individual calorie needs. Reading food labels   Check food labels for the amount of sodium per serving. Choose foods with less than 5 percent of the Daily Value of sodium. Generally, foods with less than 300 mg of sodium per serving fit into this eating plan.  To find whole grains, look for the word "whole" as the first word in the ingredient list. Shopping  Buy products labeled as "low-sodium" or "no salt added."  Buy fresh foods. Avoid canned foods and premade or frozen meals. Cooking  Avoid adding salt when cooking. Use salt-free seasonings or herbs instead of table salt or sea salt. Check with your health care  provider or pharmacist before using salt substitutes.  Do not fry foods. Cook foods using healthy methods such as baking, boiling, grilling, and broiling instead.  Cook with heart-healthy oils, such as olive, canola, soybean, or sunflower oil. Meal planning  Eat a balanced diet that includes: ? 5 or more servings of fruits and vegetables each day. At each meal, try to fill half of your plate with fruits and vegetables. ? Up to 6-8 servings of whole grains each day. ? Less than 6 oz of lean meat, poultry, or fish each day. A 3-oz serving of meat is about the same size as a deck of cards. One egg equals 1 oz. ? 2 servings of low-fat dairy each day. ? A serving of nuts, seeds, or beans 5 times each week. ? Heart-healthy fats. Healthy fats called Omega-3 fatty acids are found in foods such as flaxseeds and coldwater fish, like sardines, salmon, and mackerel.  Limit how much you eat of the following: ? Canned or prepackaged foods. ? Food that is high in trans fat, such as fried foods. ? Food that is high in saturated fat, such as fatty meat. ? Sweets, desserts, sugary drinks, and other foods with added sugar. ? Full-fat dairy products.  Do not salt foods before eating.  Try to eat at least 2 vegetarian meals each week.  Eat more home-cooked food and less restaurant, buffet, and fast food.  When eating at a  restaurant, ask that your food be prepared with less salt or no salt, if possible. What foods are recommended? The items listed may not be a complete list. Talk with your dietitian about what dietary choices are best for you. Grains Whole-grain or whole-wheat bread. Whole-grain or whole-wheat pasta. Brown rice. [place]. Bulgur. Whole-grain and low-sodium cereals. Pita bread. Low-fat, low-sodium crackers. Whole-wheat flour tortillas. Vegetables Fresh or frozen vegetables (raw, steamed, roasted, or grilled). Low-sodium or reduced-sodium tomato and vegetable juice. Low-sodium or  reduced-sodium tomato sauce and tomato paste. Low-sodium or reduced-sodium canned vegetables. Fruits All fresh, dried, or frozen fruit. Canned fruit in natural juice (without added sugar). Meat and other protein foods Skinless chicken or [place]. Ground chicken or [place]. Pork with fat trimmed off. Fish and seafood. Egg whites. Dried beans, peas, or lentils. Unsalted nuts, nut butters, and seeds. Unsalted canned beans. Lean cuts of beef with fat trimmed off. Low-sodium, lean deli meat. Dairy Low-fat (1%) or fat-free (skim) milk. Fat-free, low-fat, or reduced-fat cheeses. Nonfat, low-sodium ricotta or cottage cheese. Low-fat or nonfat yogurt. Low-fat, low-sodium cheese. Fats and oils Soft margarine without trans fats. Vegetable oil. Low-fat, reduced-fat, or light mayonnaise and salad dressings (reduced-sodium). Canola, safflower, olive, soybean, and sunflower oils. Avocado. Seasoning and other foods Herbs. Spices. Seasoning mixes without salt. Unsalted popcorn and pretzels. Fat-free sweets. What foods are not recommended? The items listed may not be a complete list. Talk with your dietitian about what dietary choices are best for you. Grains Baked goods made with fat, such as croissants, muffins, or some breads. Dry pasta or rice meal packs. Vegetables Creamed or fried vegetables. Vegetables in a cheese sauce. Regular canned vegetables (not low-sodium or reduced-sodium). Regular canned tomato sauce and paste (not low-sodium or reduced-sodium). Regular tomato and vegetable juice (not low-sodium or reduced-sodium). [name]. Olives. Fruits Canned fruit in a light or heavy syrup. Fried fruit. Fruit in cream or butter sauce. Meat and other protein foods Fatty cuts of meat. Ribs. Fried meat. [name]. Sausage. Bologna and other processed lunch meats. Salami. Fatback. Hotdogs. Bratwurst. Salted nuts and seeds. Canned beans with added salt. Canned or smoked fish. Whole eggs or egg yolks. Chicken or [place]  with skin. Dairy Whole or 2% milk, cream, and half-and-half. Whole or full-fat cream cheese. Whole-fat or sweetened yogurt. Full-fat cheese. Nondairy creamers. Whipped toppings. Processed cheese and cheese spreads. Fats and oils Butter. Stick margarine. Lard. Shortening. Ghee. Bacon fat. Tropical oils, such as coconut, palm kernel, or palm oil. Seasoning and other foods Salted popcorn and pretzels. Onion salt, garlic salt, seasoned salt, table salt, and sea salt. Worcestershire sauce. Tartar sauce. Barbecue sauce. Teriyaki sauce. Soy sauce, including reduced-sodium. Steak sauce. Canned and packaged gravies. Fish sauce. Oyster sauce. Cocktail sauce. Horseradish that you find on the shelf. Ketchup. Mustard. Meat flavorings and tenderizers. Bouillon cubes. Hot sauce and Tabasco sauce. Premade or packaged marinades. Premade or packaged taco seasonings. Relishes. Regular salad dressings. Where to find more information:  National Heart, Lung, and [place]: https://wilson-eaton.com/  American Heart Association: www.heart.org Summary  The DASH eating plan is a healthy eating plan that has been shown to reduce high blood pressure (hypertension). It may also reduce your risk for type 2 diabetes, heart disease, and stroke.  With the DASH eating plan, you should limit salt (sodium) intake to 2,300 mg a day. If you have hypertension, you may need to reduce your sodium intake to 1,500 mg a day.  When on the DASH eating plan, aim to eat more  fresh fruits and vegetables, whole grains, lean proteins, low-fat dairy, and heart-healthy fats.  Work with your health care provider or diet and nutrition specialist (dietitian) to adjust your eating plan to your individual calorie needs. This information is not intended to replace advice given to you by your health care provider. Make sure you discuss any questions you have with your health care provider. Document Revised: 09/05/2017 Document Reviewed:  09/16/2016 Elsevier Patient Education  2020 [employer].

## 2020-02-11 ENCOUNTER — Ambulatory Visit (INDEPENDENT_AMBULATORY_CARE_PROVIDER_SITE_OTHER): Payer: Self-pay | Admitting: Nurse Practitioner

## 2020-02-11 ENCOUNTER — Other Ambulatory Visit: Payer: Self-pay

## 2020-02-11 ENCOUNTER — Encounter: Payer: Self-pay | Admitting: Nurse Practitioner

## 2020-02-11 VITALS — BP 180/110 | HR 57 | Temp 96.8°F | Ht 69.0 in | Wt 209.0 lb

## 2020-02-11 DIAGNOSIS — E782 Mixed hyperlipidemia: Secondary | ICD-10-CM

## 2020-02-11 DIAGNOSIS — I10 Essential (primary) hypertension: Secondary | ICD-10-CM

## 2020-02-11 MED ORDER — LISINOPRIL 40 MG PO TABS: 40.0000 mg | ORAL_TABLET | Freq: Every day | ORAL | 2 refills | Status: DC

## 2020-02-11 MED ORDER — ATORVASTATIN CALCIUM 80 MG PO TABS: 80.0000 mg | ORAL_TABLET | Freq: Every day | ORAL | 3 refills | Status: DC

## 2020-02-11 NOTE — Progress Notes (Signed)
Careteam: Patient Care Team: Lauree Chandler, NP as PCP - General (Geriatric Medicine) Michel Bickers, MD as Consulting Physician (Infectious Diseases)  PLACE OF SERVICE:  [place]  Advanced Directive information    Allergies  Allergen Reactions  . Genvoya [Elviteg-Cobic-Emtricit-Tenofaf] Rash    Chief Complaint  Patient presents with  . Follow-up    BP follow up.     HPI: Patient is a 54 y.o. male for follow up on blood pressure.   Reports he is drinking wine now. Does not go anywhere, does not see anyone. Denies depression, reports it is more out of nothing else to do.   htn- blood pressure ranging from 126-187/63-101, most sbp over 148. Reports he does not eat right. No headache, shortness of breath, chest pains or palpitations.   Hyperlipidemia-  Not taking lipitor, he sat it aside, not sure why he stopped.   Continues to smoke Review of Systems:  Review of Systems  Constitutional: Negative for chills, fever and weight loss.  HENT: Negative for tinnitus.   Respiratory: Negative for cough, sputum production and shortness of breath.   Cardiovascular: Negative for chest pain, palpitations and leg swelling.  Gastrointestinal: Negative for abdominal pain, constipation, diarrhea and heartburn.  Genitourinary: Negative for dysuria, frequency and urgency.  Musculoskeletal: Negative for back pain, joint pain and myalgias.  Skin: Negative.   Neurological: Negative for dizziness and headaches.  Psychiatric/Behavioral: Negative for depression and memory loss. The patient is not nervous/anxious.     Past Medical History:  Diagnosis Date  . Gout   . HIV infection ([place])   . Hyperlipidemia   . Hypertension   . Substance abuse ([place])    alcohol   . Tobacco abuse    Past Surgical History:  Procedure Laterality Date  . WRIST FRACTURE SURGERY  ~ 2009   right; "put a plate in it" (75/07/2584)   Social History:   reports that he has been smoking cigarettes. He  has a 26.00 pack-year smoking history. He has never used smokeless tobacco. He reports previous alcohol use. He reports previous drug use.  Family History  Problem Relation Age of Onset  . Other Other        No known heart disease    Medications: Patient's Medications  New Prescriptions   No medications on file  Previous Medications   EMTRICITABINE-RILPIVIR-TENOFOVIR AF (ODEFSEY) 200-25-25 MG TABS TABLET    Take 1 tablet by mouth daily.   LISINOPRIL (ZESTRIL) 20 MG TABLET    Take 1 tablet (20 mg total) by mouth daily.   METOPROLOL SUCCINATE (TOPROL-XL) 50 MG 24 HR TABLET    Take 1 tablet (50 mg total) by mouth daily. Take with or immediately following a meal.  Modified Medications   No medications on file  Discontinued Medications   ATORVASTATIN (LIPITOR) 80 MG TABLET    1/2 Tablet by mouth daily x 1 week then increase to 1 tablet daily   COLCHICINE-PROBENECID 0.5-500 MG TABLET    Take 1 tablet by mouth 2 (two) times daily for 7 days.    Physical Exam:  Vitals:   02/11/20 1145  BP: (!) 180/110  Pulse: (!) 57  Temp: (!) 96.8 F (36 C)  TempSrc: Temporal  SpO2: 99%  Weight: 209 lb (94.8 kg)  Height: 5\' 9"  (1.753 m)   Body mass index is 30.86 kg/m. Wt Readings from Last 3 Encounters:  02/11/20 209 lb (94.8 kg)  02/04/20 206 lb (93.4 kg)  02/01/20 206 lb 6.4  oz (93.6 kg)    Physical Exam Constitutional:      Appearance: Normal appearance.  HENT:     Head: Normocephalic and atraumatic.  Eyes:     Pupils: Pupils are equal, round, and reactive to light.  Cardiovascular:     Rate and Rhythm: Normal rate and regular rhythm.  Pulmonary:     Effort: Pulmonary effort is normal.     Breath sounds: Normal breath sounds.  Musculoskeletal:     Right lower leg: No edema.     Left lower leg: No edema.  Skin:    General: Skin is warm and dry.  Neurological:     General: No focal deficit present.     Mental Status: He is alert and oriented to person, place, and time. Mental  status is at baseline.  Psychiatric:        Mood and Affect: Mood normal.        Behavior: Behavior normal.     Labs reviewed: Basic Metabolic Panel: Recent Labs    01/03/20 1405 01/04/20 1153 02/01/20 1525  NA 122* 124* 138  K 3.5 3.6 4.1  CL 87* 88* 105  CO2 24 25 24   GLUCOSE 121* 120* 105*  BUN 8 9 12   CREATININE 0.75 0.74 0.88  CALCIUM 9.2 9.0 9.1   Liver Function Tests: Recent Labs    01/03/20 1405 01/04/20 1153  AST 29 33  ALT 21 26  ALKPHOS  --  77  BILITOT 0.4 1.0  PROT 7.6 7.7  ALBUMIN  --  3.8   No results for input(s): LIPASE, AMYLASE in the last 8760 hours. No results for input(s): AMMONIA in the last 8760 hours. CBC: Recent Labs    01/03/20 1405 01/04/20 1153 02/01/20 1525  WBC 6.5 4.6 7.8  NEUTROABS 2,386 2.4 3,713  HGB 13.6 13.7 12.9*  HCT 39.0 38.8* 38.8  MCV 88.4 86.0 89.4  PLT 262 247 233   Lipid Panel: Recent Labs    01/03/20 1405  CHOL 228*  HDL 53  LDLCALC 135*  TRIG 263*  CHOLHDL 4.3   TSH: No results for input(s): TSH in the last 8760 hours. A1C: Lab Results  Component Value Date   HGBA1C 5.9 (H) 02/27/2018     Assessment/Plan 1. Uncontrolled hypertension -improved to 170/100 on recheck but still elevated, reports he smoked prior to coming in for appt. Discussed in detail about lifestyle modifications for control of htn along with medication compliance. Smoking and etoh cessation encouraged.  -discussed risk of heart failure, MI, CVA, CKD due to uncontrolled htn.  -will increase lisinopril to 40 mg to help bring bp to goal, <140/90.  -to continue toprol XL.  - lisinopril (ZESTRIL) 40 MG tablet; Take 1 tablet (40 mg total) by mouth daily.  Dispense: 30 tablet; Refill: 2  2. Mixed hyperlipidemia -did not continue lipitor, reports he has at home, encouraged to restart with half tablet x 2 weeks then increase to whole for control on lipids. Discussed risk of uncontrolled lipids.  - atorvastatin (LIPITOR) 80 MG tablet;  Take 1 tablet (80 mg total) by mouth daily.  Dispense: 90 tablet; Refill: 3  Next appt: 1 week  Marciana Uplinger K. 01/05/20  [facility] [phone]

## 2020-02-11 NOTE — Patient Instructions (Addendum)
Increase lisinopril to 40 mg daily Continue on toprol 50 mg daily   To restart lipitor- half tablet for 1 week then increase to whole tablet daily at bedtime/evening.  Check blood pressure daily- 1 hour AFTER you have taken your medication    DASH Eating Plan DASH stands for "Dietary Approaches to Stop Hypertension." The DASH eating plan is a healthy eating plan that has been shown to reduce high blood pressure (hypertension). It may also reduce your risk for type 2 diabetes, heart disease, and stroke. The DASH eating plan may also help with weight loss. What are tips for following this plan?  General guidelines  Avoid eating more than 2,300 mg (milligrams) of salt (sodium) a day. If you have hypertension, you may need to reduce your sodium intake to 1,500 mg a day.  Limit alcohol intake to no more than 1 drink a day for nonpregnant women and 2 drinks a day for men. One drink equals 12 oz of beer, 5 oz of wine, or 1 oz of hard liquor.  Work with your health care provider to maintain a healthy body weight or to lose weight. Ask what an ideal weight is for you.  Get at least 30 minutes of exercise that causes your heart to beat faster (aerobic exercise) most days of the week. Activities may include walking, swimming, or biking.  Work with your health care provider or diet and nutrition specialist (dietitian) to adjust your eating plan to your individual calorie needs. Reading food labels   Check food labels for the amount of sodium per serving. Choose foods with less than 5 percent of the Daily Value of sodium. Generally, foods with less than 300 mg of sodium per serving fit into this eating plan.  To find whole grains, look for the word "whole" as the first word in the ingredient list. Shopping  Buy products labeled as "low-sodium" or "no salt added."  Buy fresh foods. Avoid canned foods and premade or frozen meals. Cooking  Avoid adding salt when cooking. Use salt-free seasonings  or herbs instead of table salt or sea salt. Check with your health care provider or pharmacist before using salt substitutes.  Do not fry foods. Cook foods using healthy methods such as baking, boiling, grilling, and broiling instead.  Cook with heart-healthy oils, such as olive, canola, soybean, or sunflower oil. Meal planning  Eat a balanced diet that includes: ? 5 or more servings of fruits and vegetables each day. At each meal, try to fill half of your plate with fruits and vegetables. ? Up to 6-8 servings of whole grains each day. ? Less than 6 oz of lean meat, poultry, or fish each day. A 3-oz serving of meat is about the same size as a deck of cards. One egg equals 1 oz. ? 2 servings of low-fat dairy each day. ? A serving of nuts, seeds, or beans 5 times each week. ? Heart-healthy fats. Healthy fats called Omega-3 fatty acids are found in foods such as flaxseeds and coldwater fish, like sardines, salmon, and mackerel.  Limit how much you eat of the following: ? Canned or prepackaged foods. ? Food that is high in trans fat, such as fried foods. ? Food that is high in saturated fat, such as fatty meat. ? Sweets, desserts, sugary drinks, and other foods with added sugar. ? Full-fat dairy products.  Do not salt foods before eating.  Try to eat at least 2 vegetarian meals each week.  Eat more home-cooked  food and less restaurant, buffet, and fast food.  When eating at a restaurant, ask that your food be prepared with less salt or no salt, if possible. What foods are recommended? The items listed may not be a complete list. Talk with your dietitian about what dietary choices are best for you. Grains Whole-grain or whole-wheat bread. Whole-grain or whole-wheat pasta. Brown rice. [place]. Bulgur. Whole-grain and low-sodium cereals. Pita bread. Low-fat, low-sodium crackers. Whole-wheat flour tortillas. Vegetables Fresh or frozen vegetables (raw, steamed, roasted, or grilled).  Low-sodium or reduced-sodium tomato and vegetable juice. Low-sodium or reduced-sodium tomato sauce and tomato paste. Low-sodium or reduced-sodium canned vegetables. Fruits All fresh, dried, or frozen fruit. Canned fruit in natural juice (without added sugar). Meat and other protein foods Skinless chicken or [place]. Ground chicken or [place]. Pork with fat trimmed off. Fish and seafood. Egg whites. Dried beans, peas, or lentils. Unsalted nuts, nut butters, and seeds. Unsalted canned beans. Lean cuts of beef with fat trimmed off. Low-sodium, lean deli meat. Dairy Low-fat (1%) or fat-free (skim) milk. Fat-free, low-fat, or reduced-fat cheeses. Nonfat, low-sodium ricotta or cottage cheese. Low-fat or nonfat yogurt. Low-fat, low-sodium cheese. Fats and oils Soft margarine without trans fats. Vegetable oil. Low-fat, reduced-fat, or light mayonnaise and salad dressings (reduced-sodium). Canola, safflower, olive, soybean, and sunflower oils. Avocado. Seasoning and other foods Herbs. Spices. Seasoning mixes without salt. Unsalted popcorn and pretzels. Fat-free sweets. What foods are not recommended? The items listed may not be a complete list. Talk with your dietitian about what dietary choices are best for you. Grains Baked goods made with fat, such as croissants, muffins, or some breads. Dry pasta or rice meal packs. Vegetables Creamed or fried vegetables. Vegetables in a cheese sauce. Regular canned vegetables (not low-sodium or reduced-sodium). Regular canned tomato sauce and paste (not low-sodium or reduced-sodium). Regular tomato and vegetable juice (not low-sodium or reduced-sodium). [name]. Olives. Fruits Canned fruit in a light or heavy syrup. Fried fruit. Fruit in cream or butter sauce. Meat and other protein foods Fatty cuts of meat. Ribs. Fried meat. [name]. Sausage. Bologna and other processed lunch meats. Salami. Fatback. Hotdogs. Bratwurst. Salted nuts and seeds. Canned beans with added  salt. Canned or smoked fish. Whole eggs or egg yolks. Chicken or [place] with skin. Dairy Whole or 2% milk, cream, and half-and-half. Whole or full-fat cream cheese. Whole-fat or sweetened yogurt. Full-fat cheese. Nondairy creamers. Whipped toppings. Processed cheese and cheese spreads. Fats and oils Butter. Stick margarine. Lard. Shortening. Ghee. Bacon fat. Tropical oils, such as coconut, palm kernel, or palm oil. Seasoning and other foods Salted popcorn and pretzels. Onion salt, garlic salt, seasoned salt, table salt, and sea salt. Worcestershire sauce. Tartar sauce. Barbecue sauce. Teriyaki sauce. Soy sauce, including reduced-sodium. Steak sauce. Canned and packaged gravies. Fish sauce. Oyster sauce. Cocktail sauce. Horseradish that you find on the shelf. Ketchup. Mustard. Meat flavorings and tenderizers. Bouillon cubes. Hot sauce and Tabasco sauce. Premade or packaged marinades. Premade or packaged taco seasonings. Relishes. Regular salad dressings. Where to find more information:  National Heart, Lung, and [place]: https://wilson-eaton.com/  American Heart Association: www.heart.org Summary  The DASH eating plan is a healthy eating plan that has been shown to reduce high blood pressure (hypertension). It may also reduce your risk for type 2 diabetes, heart disease, and stroke.  With the DASH eating plan, you should limit salt (sodium) intake to 2,300 mg a day. If you have hypertension, you may need to reduce your sodium intake to 1,500 mg  a day.  When on the DASH eating plan, aim to eat more fresh fruits and vegetables, whole grains, lean proteins, low-fat dairy, and heart-healthy fats.  Work with your health care provider or diet and nutrition specialist (dietitian) to adjust your eating plan to your individual calorie needs. This information is not intended to replace advice given to you by your health care provider. Make sure you discuss any questions you have with your health care  provider. Document Revised: 09/05/2017 Document Reviewed: 09/16/2016 Elsevier Patient Education  2020 [employer].

## 2020-02-18 ENCOUNTER — Ambulatory Visit: Payer: Self-pay | Admitting: Nurse Practitioner

## 2020-03-13 ENCOUNTER — Ambulatory Visit: Payer: Self-pay | Admitting: Nurse Practitioner

## 2020-03-17 ENCOUNTER — Other Ambulatory Visit: Payer: Self-pay

## 2020-03-20 ENCOUNTER — Ambulatory Visit: Payer: Self-pay | Admitting: Nurse Practitioner

## 2020-03-21 ENCOUNTER — Other Ambulatory Visit: Payer: Self-pay

## 2020-03-21 DIAGNOSIS — Z113 Encounter for screening for infections with a predominantly sexual mode of transmission: Secondary | ICD-10-CM

## 2020-03-21 DIAGNOSIS — Z79899 Other long term (current) drug therapy: Secondary | ICD-10-CM

## 2020-03-21 DIAGNOSIS — B20 Human immunodeficiency virus [HIV] disease: Secondary | ICD-10-CM

## 2020-03-22 LAB — T-HELPER CELL (CD4) - (RCID CLINIC ONLY)
CD4 % Helper T Cell: 13 % — ABNORMAL LOW (ref 33–65)
CD4 T Cell Abs: 450 /uL (ref 400–1790)

## 2020-03-23 LAB — RPR: RPR Ser Ql: NONREACTIVE

## 2020-03-23 LAB — COMPREHENSIVE METABOLIC PANEL
AG Ratio: 1.3 (calc) (ref 1.0–2.5)
ALT: 11 U/L (ref 9–46)
AST: 20 U/L (ref 10–35)
Albumin: 4.2 g/dL (ref 3.6–5.1)
Alkaline phosphatase (APISO): 97 U/L (ref 35–144)
BUN: 10 mg/dL (ref 7–25)
CO2: 27 mmol/L (ref 20–32)
Calcium: 9.3 mg/dL (ref 8.6–10.3)
Chloride: 102 mmol/L (ref 98–110)
Creat: 0.91 mg/dL (ref 0.70–1.33)
Globulin: 3.3 g/dL (calc) (ref 1.9–3.7)
Glucose, Bld: 151 mg/dL — ABNORMAL HIGH (ref 65–99)
Potassium: 4.4 mmol/L (ref 3.5–5.3)
Sodium: 137 mmol/L (ref 135–146)
Total Bilirubin: 0.5 mg/dL (ref 0.2–1.2)
Total Protein: 7.5 g/dL (ref 6.1–8.1)

## 2020-03-23 LAB — LIPID PANEL
Cholesterol: 267 mg/dL — ABNORMAL HIGH (ref <200)
HDL: 53 mg/dL (ref >=40)
LDL Cholesterol (Calc): 167 mg/dL (calc) — ABNORMAL HIGH
Non-HDL Cholesterol (Calc): 214 mg/dL (calc) — ABNORMAL HIGH (ref <130)
Total CHOL/HDL Ratio: 5 (calc) — ABNORMAL HIGH (ref <5.0)
Triglycerides: 305 mg/dL — ABNORMAL HIGH (ref <150)

## 2020-03-23 LAB — CBC WITH DIFFERENTIAL/PLATELET
Absolute Monocytes: 608 cells/uL (ref 200–950)
Basophils Absolute: 78 cells/uL (ref 0–200)
Basophils Relative: 1 %
Eosinophils Absolute: 179 cells/uL (ref 15–500)
Eosinophils Relative: 2.3 %
HCT: 41.1 % (ref 38.5–50.0)
Hemoglobin: 13.8 g/dL (ref 13.2–17.1)
Lymphs Abs: 3401 cells/uL (ref 850–3900)
MCH: 30.5 pg (ref 27.0–33.0)
MCHC: 33.6 g/dL (ref 32.0–36.0)
MCV: 90.7 fL (ref 80.0–100.0)
MPV: 10 fL (ref 7.5–12.5)
Monocytes Relative: 7.8 %
Neutro Abs: 3533 cells/uL (ref 1500–7800)
Neutrophils Relative %: 45.3 %
Platelets: 292 10*3/uL (ref 140–400)
RBC: 4.53 10*6/uL (ref 4.20–5.80)
RDW: 13.1 % (ref 11.0–15.0)
Total Lymphocyte: 43.6 %
WBC: 7.8 10*3/uL (ref 3.8–10.8)

## 2020-03-23 LAB — HIV-1 RNA QUANT-NO REFLEX-BLD
HIV 1 RNA Quant: <20 copies/mL
HIV-1 RNA Quant, Log: <1.3 Log copies/mL

## 2020-03-31 ENCOUNTER — Ambulatory Visit: Payer: Self-pay | Admitting: Nurse Practitioner

## 2020-04-03 ENCOUNTER — Ambulatory Visit: Payer: Self-pay | Admitting: Nurse Practitioner

## 2020-04-04 ENCOUNTER — Encounter: Payer: Self-pay | Admitting: Nurse Practitioner

## 2020-04-05 NOTE — Telephone Encounter (Signed)
This encounter was created in error - please disregard.

## 2020-04-13 ENCOUNTER — Other Ambulatory Visit: Payer: Self-pay

## 2020-04-13 ENCOUNTER — Ambulatory Visit: Payer: Self-pay

## 2020-04-13 ENCOUNTER — Ambulatory Visit (INDEPENDENT_AMBULATORY_CARE_PROVIDER_SITE_OTHER): Payer: Self-pay | Admitting: Internal Medicine

## 2020-04-13 ENCOUNTER — Encounter: Payer: Self-pay | Admitting: Internal Medicine

## 2020-04-13 DIAGNOSIS — K089 Disorder of teeth and supporting structures, unspecified: Secondary | ICD-10-CM

## 2020-04-13 DIAGNOSIS — B20 Human immunodeficiency virus [HIV] disease: Secondary | ICD-10-CM

## 2020-04-13 DIAGNOSIS — I10 Essential (primary) hypertension: Secondary | ICD-10-CM

## 2020-04-13 DIAGNOSIS — K6289 Other specified diseases of anus and rectum: Secondary | ICD-10-CM | POA: Insufficient documentation

## 2020-04-13 DIAGNOSIS — K130 Diseases of lips: Secondary | ICD-10-CM

## 2020-04-13 DIAGNOSIS — Z720 Tobacco use: Secondary | ICD-10-CM

## 2020-04-13 NOTE — Assessment & Plan Note (Signed)
Dental referral placed today for [facility]. Information to schedule appointment completed today.   

## 2020-04-13 NOTE — Assessment & Plan Note (Signed)
I talked to him again about the importance of cigarette cessation. 

## 2020-04-13 NOTE — Assessment & Plan Note (Signed)
His HIV infection remains under excellent, long-term control.  He will continue Odefsey and follow-up after lab work in 6 months.  I encouraged him to go ahead and get an appointment for Covid vaccination.

## 2020-04-13 NOTE — Assessment & Plan Note (Signed)
His blood pressure remains poorly controlled.  I encouraged him to follow-up with his PCP soon.

## 2020-04-13 NOTE — Assessment & Plan Note (Signed)
I can find no cause for his intermittent perirectal pain.

## 2020-04-13 NOTE — Progress Notes (Signed)
Patient Active Problem List   Diagnosis Date Noted  . HIV disease (HCC) 05/18/2015    Priority: High  . Lip lesion 04/13/2020  . Poor dentition 04/13/2020  . Rectal pain 04/13/2020  . Chronic bronchitis (HCC) 01/03/2020  . Hypertensive emergency 06/02/2017  . Alcohol abuse 06/01/2017  . Pain, dental 07/29/2016  . Acute gouty arthritis 01/30/2015  . Tobacco abuse 07/30/2012  . Hypertension 07/29/2012    Patient's Medications  New Prescriptions   No medications on file  Previous Medications   ATORVASTATIN (LIPITOR) 80 MG TABLET    Take 1 tablet (80 mg total) by mouth daily.   EMTRICITABINE-RILPIVIR-TENOFOVIR AF (ODEFSEY) 200-25-25 MG TABS TABLET    Take 1 tablet by mouth daily.   LISINOPRIL (ZESTRIL) 40 MG TABLET    Take 1 tablet (40 mg total) by mouth daily.   METOPROLOL SUCCINATE (TOPROL-XL) 50 MG 24 HR TABLET    Take 1 tablet (50 mg total) by mouth daily. Take with or immediately following a meal.  Modified Medications   No medications on file  Discontinued Medications   No medications on file    Subjective: Brandon Quinn is in for his routine HIV follow-up visit.  He denies any problems obtaining, taking or tolerating his Odefsey and says that he rarely misses doses.  He takes it around noon each day.  He remains out of work but says he is looking for work currently.  He has not taken the Covid vaccine yet.  He was recently in the ED with gouty bursitis of his left elbow.  It resolved promptly with colchicine.  He states that he is taking his antihypertensive medications.  He is still smoking cigarettes with no current plan to quit.  He says he has cut down on his alcohol intake.  He will occasionally walk around the.  He says that he is a little bit concerned about the chronic lesion on his lower lip.  It has not changed over the past year.  He states that he is also been bothered by intermittent perirectal pain for the past month.  He says that it only bothers him in the  morning.  Review of Systems: Review of Systems  Constitutional: Negative for chills, diaphoresis, fever and weight loss.  HENT: Negative for congestion and sore throat.   Respiratory: Negative for cough and shortness of breath.   Cardiovascular: Negative for chest pain.  Gastrointestinal: Negative for abdominal pain, blood in stool, constipation, diarrhea, nausea and vomiting.  Genitourinary: Negative for dysuria.  Musculoskeletal: Negative for back pain and joint pain.  Skin: Negative for rash.  Psychiatric/Behavioral: Negative for depression.    Past Medical History:  Diagnosis Date  . Gout   . HIV infection (HCC)   . Hyperlipidemia   . Hypertension   . Substance abuse (HCC)    alcohol   . Tobacco abuse     Social History   Tobacco Use  . Smoking status: Current Every Day Smoker    Packs/day: 1.00    Years: 26.00    Pack years: 26.00    Types: Cigarettes  . Smokeless tobacco: Never Used  Vaping Use  . Vaping Use: Never used  Substance Use Topics  . Alcohol use: Not Currently  . Drug use: Not Currently    Family History  Problem Relation Age of Onset  . Other Other        No known heart disease    Allergies  Allergen  Reactions  . Genvoya [Elviteg-Cobic-Emtricit-Tenofaf] Rash    Health Maintenance  Topic Date Due  . COVID-19 Vaccine (1) Never done  . TETANUS/TDAP  Never done  . COLONOSCOPY  Never done  . INFLUENZA VACCINE  05/07/2020  . Hepatitis C Screening  Completed  . HIV Screening  Completed    Objective:  Vitals:   04/13/20 1139 04/13/20 1155  BP: (!) 211/91 (!) 214/87  Pulse: 67 66  Weight: 207 lb (93.9 kg)    Body mass index is 30.57 kg/m.  Physical Exam Constitutional:      Comments: He is in good spirits.  HENT:     Mouth/Throat:     Comments: He has poor dentition.  There is a very small superficial ulcer of his lower lip. Cardiovascular:     Rate and Rhythm: Normal rate and regular rhythm.     Heart sounds: No murmur heard.    Pulmonary:     Effort: Pulmonary effort is normal.     Breath sounds: Normal breath sounds.  Abdominal:     Palpations: Abdomen is soft.     Tenderness: There is no abdominal tenderness.     Comments: He has no perirectal lesions or pain with palpation.  Musculoskeletal:        General: No swelling or tenderness.  Skin:    Findings: No rash.  Psychiatric:        Mood and Affect: Mood normal.     Lab Results Lab Results  Component Value Date   WBC 7.8 03/21/2020   HGB 13.8 03/21/2020   HCT 41.1 03/21/2020   MCV 90.7 03/21/2020   PLT 292 03/21/2020    Lab Results  Component Value Date   CREATININE 0.91 03/21/2020   BUN 10 03/21/2020   NA 137 03/21/2020   K 4.4 03/21/2020   CL 102 03/21/2020   CO2 27 03/21/2020    Lab Results  Component Value Date   ALT 11 03/21/2020   AST 20 03/21/2020   ALKPHOS 77 01/04/2020   BILITOT 0.5 03/21/2020    Lab Results  Component Value Date   CHOL 267 (H) 03/21/2020   HDL 53 03/21/2020   LDLCALC 167 (H) 03/21/2020   TRIG 305 (H) 03/21/2020   CHOLHDL 5.0 (H) 03/21/2020   Lab Results  Component Value Date   LABRPR NON-REACTIVE 03/21/2020   HIV 1 RNA Quant (copies/mL)  Date Value  03/21/2020 <20 NOT DETECTED  07/20/2019 <20 NOT DETECTED  01/19/2019 81 (H)   CD4 T Cell Abs (/uL)  Date Value  03/21/2020 450  07/20/2019 461  01/19/2019 390 (L)     Problem List Items Addressed This Visit      High   HIV disease (HCC)    His HIV infection remains under excellent, long-term control.  He will continue Odefsey and follow-up after lab work in 6 months.  I encouraged him to go ahead and get an appointment for Covid vaccination.      Relevant Orders   T-helper cell (CD4)- ([facility] only)   HIV-1 RNA quant-no reflex-bld   CBC   Comprehensive metabolic panel   Lipid panel   RPR   Urine cytology ancillary only     Unprioritized   Tobacco abuse    I talked to him again about the importance of cigarette cessation.        Rectal pain    I can find no cause for his intermittent perirectal pain.      Poor dentition  Dental referral placed today for [facility]. Information to schedule appointment completed today.       Lip lesion    Dental referral placed today for [facility]. Information to schedule appointment completed today.        Hypertension    His blood pressure remains poorly controlled.  I encouraged him to follow-up with his PCP soon.           Cliffton Asters, MD [facility] for Infectious Disease [facility] [phone] pager   [phone] cell 04/13/2020, 1:38 PM

## 2020-04-14 ENCOUNTER — Encounter: Payer: Self-pay | Admitting: Internal Medicine

## 2020-04-14 LAB — URINE CYTOLOGY ANCILLARY ONLY
Chlamydia: NEGATIVE
Comment: NEGATIVE
Comment: NORMAL
Neisseria Gonorrhea: NEGATIVE

## 2020-04-19 ENCOUNTER — Telehealth: Payer: Self-pay

## 2020-04-19 NOTE — Telephone Encounter (Signed)
Patient informed of lab results and verbalized understanding. Name and DOB verified.  Brandon Quinn T [name]

## 2020-04-19 NOTE — Telephone Encounter (Signed)
Patient called requesting his urine cytology results. He would like a call back regarding the results.

## 2020-04-19 NOTE — Telephone Encounter (Signed)
Attempted to call the patient and no answer

## 2020-04-19 NOTE — Telephone Encounter (Signed)
Please let him know that the urine tests for gonorrhea and chlamydia were negative.

## 2020-05-06 ENCOUNTER — Other Ambulatory Visit: Payer: Self-pay | Admitting: Family

## 2020-05-06 DIAGNOSIS — I10 Essential (primary) hypertension: Secondary | ICD-10-CM

## 2020-05-08 ENCOUNTER — Other Ambulatory Visit: Payer: Self-pay | Admitting: Nurse Practitioner

## 2020-05-08 DIAGNOSIS — I10 Essential (primary) hypertension: Secondary | ICD-10-CM

## 2020-08-06 ENCOUNTER — Other Ambulatory Visit: Payer: Self-pay | Admitting: Nurse Practitioner

## 2020-08-06 DIAGNOSIS — I10 Essential (primary) hypertension: Secondary | ICD-10-CM

## 2020-08-07 NOTE — Telephone Encounter (Deleted)
Refill request for Lisinopril 40 mg tablet and Metoprolol 50 mg tablet. Patient had not had an appointment since may. Is it ok to refill medication, Please advise. Medications pended and sent to Richarda Blade, NP, since she is covering Abbey Chatters', NP in basket.

## 2020-08-08 ENCOUNTER — Ambulatory Visit: Payer: Self-pay | Admitting: Family

## 2020-08-11 ENCOUNTER — Ambulatory Visit: Payer: Self-pay | Admitting: Family

## 2020-08-17 ENCOUNTER — Other Ambulatory Visit: Payer: Self-pay

## 2020-08-17 ENCOUNTER — Encounter: Payer: Self-pay | Admitting: Internal Medicine

## 2020-08-17 ENCOUNTER — Ambulatory Visit (INDEPENDENT_AMBULATORY_CARE_PROVIDER_SITE_OTHER): Payer: Self-pay | Admitting: Internal Medicine

## 2020-08-17 VITALS — BP 160/92 | HR 64 | Temp 97.8°F | Ht 69.0 in | Wt 205.0 lb

## 2020-08-17 DIAGNOSIS — N5201 Erectile dysfunction due to arterial insufficiency: Secondary | ICD-10-CM

## 2020-08-17 DIAGNOSIS — E782 Mixed hyperlipidemia: Secondary | ICD-10-CM

## 2020-08-17 DIAGNOSIS — B20 Human immunodeficiency virus [HIV] disease: Secondary | ICD-10-CM

## 2020-08-17 DIAGNOSIS — I10 Essential (primary) hypertension: Secondary | ICD-10-CM

## 2020-08-17 MED ORDER — ASPIRIN 81 MG PO CHEW
81.0000 mg | CHEWABLE_TABLET | Freq: Every day | ORAL | 6 refills | Status: DC
Start: 2020-08-17 — End: 2020-08-17

## 2020-08-17 MED ORDER — LISINOPRIL 5 MG PO TABS
5.0000 mg | ORAL_TABLET | Freq: Every day | ORAL | 3 refills | Status: DC
Start: 2020-08-17 — End: 2020-11-07

## 2020-08-17 MED ORDER — ASPIRIN 81 MG PO CHEW
81.0000 mg | CHEWABLE_TABLET | Freq: Every day | ORAL | 3 refills | Status: DC
Start: 2020-08-17 — End: 2021-10-29

## 2020-08-17 MED ORDER — HYDRALAZINE HCL 25 MG PO TABS: 25.0000 mg | ORAL_TABLET | Freq: Three times a day (TID) | ORAL | 3 refills | Status: DC

## 2020-08-17 MED ORDER — ATORVASTATIN CALCIUM 80 MG PO TABS: 80.0000 mg | ORAL_TABLET | Freq: Every day | ORAL | 3 refills | Status: DC

## 2020-08-17 MED ORDER — METOPROLOL SUCCINATE ER 50 MG PO TB24: 50.0000 mg | ORAL_TABLET | Freq: Every day | ORAL | 3 refills | Status: DC

## 2020-08-17 MED ORDER — METOPROLOL SUCCINATE ER 50 MG PO TB24: ORAL_TABLET | ORAL | 3 refills | Status: DC

## 2020-08-17 MED ORDER — SILDENAFIL CITRATE 25 MG PO TABS: 25.0000 mg | ORAL_TABLET | Freq: Every day | ORAL | 3 refills | Status: DC | PRN

## 2020-08-17 NOTE — Patient Instructions (Signed)
Continue to cut back on cigarettes to help decrease your risk of heart attack or stroke. Also, start on hydralazine 25mg  by mouth three times a day.   Continue lisinopril 5mg  daily and metoprolol succinate 50mg  daily. Continue your blood pressure checks and if you are running over 150/90 with this regimen, call back.

## 2020-08-17 NOTE — Progress Notes (Signed)
Location:  [facility] clinic Provider: Nivan Melendrez L. Renato Gails, D.O., C.M.D.  Goals of Care:  Advanced Directives 08/17/2020  Does Patient Have a Medical Advance Directive? No  Would patient like information on creating a medical advance directive? No - Patient declined     Chief Complaint  Patient presents with  . Acute Visit    Medication refill from missed appointment on 08/11/2020  . Health Maintenance    Discuss need for Colonoscopy and Tetanus     HPI: Patient is a 54 y.o. male seen today for an acute visit for  He was checking his bp at home.  It went 230/145.  Kept overnight at [facility].  Checked for brain, heart, lung damage.  Echo was ok.  CT brain and MRI all ok.  Had CT chest (?).  He was placed on cholesterol medication-atorvastatin 80mg  and aspirin 81mg .    BP had been 180-190 with the medication.  Running 135-140 for him now.    Appears he had discomfort in his chest that was ruled out for cardiac--given gerd and flatus meds.  He's gotten his flu shot.  Has religious reasons not to get his covid vaccines.  He's not working right now and that's why he's not had a colonoscopy.  Hopes to go back to work in the new year.    He c/o erectile dysfunction and requests viagra.  I asked his current personal life status.  He is HIV positive, but undetectable viral load and faithful with his meds.  He and his male partner use condoms.  She is not on preventative medication.  Educated that improving bp, cholesterol and smoking cessation should help this problem given its vascular etiology.   Past Medical History:  Diagnosis Date  . Gout   . HIV infection (HCC)   . Hyperlipidemia   . Hypertension   . Substance abuse (HCC)    alcohol   . Tobacco abuse     Past Surgical History:  Procedure Laterality Date  . WRIST FRACTURE SURGERY  ~ 2009   right; "put a plate in it" ( )    Allergies  Allergen Reactions  . Genvoya [Elviteg-Cobic-Emtricit-Tenofaf] Rash     Outpatient Encounter Medications as of 08/17/2020  Medication Sig  . aspirin (ASPIRIN 81) 81 MG chewable tablet Chew 1 tablet (81 mg total) by mouth daily.  81/07/3158 atorvastatin (LIPITOR) 80 MG tablet Take 1 tablet (80 mg total) by mouth daily.  13/08/2020 emtricitabine-rilpivir-tenofovir AF (ODEFSEY) 200-25-25 MG TABS tablet Take 1 tablet by mouth daily.  Marland Kitchen lisinopril (ZESTRIL) 5 MG tablet Take 1 tablet (5 mg total) by mouth daily.  . metoprolol succinate (TOPROL-XL) 50 MG 24 hr tablet NEEDS APPOINTMENT BEFORE ANY FUTURE REFILLS  . [DISCONTINUED] aspirin (ASPIRIN 81) 81 MG chewable tablet Chew by mouth daily.  . [DISCONTINUED] atorvastatin (LIPITOR) 80 MG tablet Take 1 tablet (80 mg total) by mouth daily.  . [DISCONTINUED] lisinopril (ZESTRIL) 5 MG tablet Take 5 mg by mouth daily.  . [DISCONTINUED] metoprolol succinate (TOPROL-XL) 50 MG 24 hr tablet NEEDS APPOINTMENT BEFORE ANY FUTURE REFILLS  . [DISCONTINUED] lisinopril (ZESTRIL) 40 MG tablet Take one tablet by mouth once daily. NEEDS APPOINTMENT BEFORE ANYMORE FUTURE REFILLS.   No facility-administered encounter medications on file as of 08/17/2020.    Review of Systems:  Review of Systems  Constitutional: Negative for chills, fever and malaise/fatigue.  Respiratory: Negative for shortness of breath.   Cardiovascular: Negative for chest pain and palpitations.  Gastrointestinal: Positive for heartburn. Negative for  abdominal pain.       At [facility] ED when his BP was so high--reports lying flat for hours and not eating, but being given pills  Genitourinary: Negative for dysuria.       Erectile dysfunction  Musculoskeletal: Negative for falls.  Skin: Negative for itching and rash.  Neurological: Negative for dizziness, loss of consciousness and headaches.  Endo/Heme/Allergies: Does not bruise/bleed easily.  Psychiatric/Behavioral: Negative for depression and memory loss. The patient is not nervous/anxious.     Health Maintenance  Topic Date  Due  . TETANUS/TDAP  Never done  . COLONOSCOPY  Never done  . COVID-19 Vaccine (1) 09/02/2020 (Originally 01/27/1978)  . INFLUENZA VACCINE  Completed  . Hepatitis C Screening  Completed  . HIV Screening  Completed    Physical Exam: Vitals:   08/17/20 1143  BP: (!) 160/92  Pulse: 64  Temp: 97.8 F (36.6 C)  TempSrc: Temporal  SpO2: 98%  Weight: 205 lb (93 kg)  Height: 5\' 9"  (1.753 m)   Body mass index is 30.27 kg/m. Physical Exam Vitals reviewed.  Constitutional:      Appearance: Normal appearance.  Eyes:     Comments: Red eyes  Cardiovascular:     Rate and Rhythm: Normal rate and regular rhythm.     Pulses: Normal pulses.     Heart sounds: Normal heart sounds.  Pulmonary:     Effort: Pulmonary effort is normal.     Breath sounds: Normal breath sounds. No wheezing, rhonchi or rales.  Musculoskeletal:        General: Normal range of motion.  Neurological:     General: No focal deficit present.     Mental Status: He is alert and oriented to person, place, and time.  Psychiatric:        Mood and Affect: Mood normal.     Labs reviewed: Basic Metabolic Panel: Recent Labs    01/04/20 1153 02/01/20 1525 03/21/20 1049  NA 124* 138 137  K 3.6 4.1 4.4  CL 88* 105 102  CO2 25 24 27   GLUCOSE 120* 105* 151*  BUN 9 12 10   CREATININE 0.74 0.88 0.91  CALCIUM 9.0 9.1 9.3   Liver Function Tests: Recent Labs    01/03/20 1405 01/04/20 1153 03/21/20 1049  AST 29 33 20  ALT 21 26 11   ALKPHOS  --  77  --   BILITOT 0.4 1.0 0.5  PROT 7.6 7.7 7.5  ALBUMIN  --  3.8  --    No results for input(s): LIPASE, AMYLASE in the last 8760 hours. No results for input(s): AMMONIA in the last 8760 hours. CBC: Recent Labs    01/04/20 1153 02/01/20 1525 03/21/20 1049  WBC 4.6 7.8 7.8  NEUTROABS 2.4 3,713 3,533  HGB 13.7 12.9* 13.8  HCT 38.8* 38.8 41.1  MCV 86.0 89.4 90.7  PLT 247 233 292   Lipid Panel: Recent Labs    01/03/20 1405 03/21/20 1049  CHOL 228* 267*  HDL  53 53  LDLCALC 135* 167*  TRIG 263* 305*  CHOLHDL 4.3 5.0*   Lab Results  Component Value Date   HGBA1C 5.9 (H) 02/27/2018   Assessment/Plan 1. Mixed hyperlipidemia - was started on lipitor at the [facility] ED visit due to high cholesterol levels -reports he had did not include test results only Rx for this - atorvastatin (LIPITOR) 80 MG tablet; Take 1 tablet (80 mg total) by mouth daily.  Dispense: 90 tablet; Refill: 3  2. HIV disease (  HCC) -continues to follow with Dr. Orvan Falconer and be faithful with his medications  3. Uncontrolled hypertension - remains a problem though improved oddly after his hospitalization though he'd not gotten the hydralazine that they'd recommended as 50mg  po tid--b/c bps for him since have been 130-160, we went with the 25mg  po tid and if he continues to run over 140/90, he will call back - metoprolol succinate (TOPROL-XL) 50 MG 24 hr tablet; NEEDS APPOINTMENT BEFORE ANY FUTURE REFILLS  Dispense: 60 tablet; Refill: 3 -cont lisinopril that was lowered to 5mg  daily   4. Erectile dysfunction due to arterial insufficiency -counseled on safe sex and possible prophylaxis for his partner and bp, lipid control and smoking cessation to help this - sildenafil (VIAGRA) 25 MG tablet; Take 1 tablet (25 mg total) by mouth daily as needed for erectile dysfunction.  Dispense: 10 tablet; Refill: 3  Labs/tests ordered:  No new today Next appt:  6 mos with Jessica, fasting labs before  Stafford Riviera L. Rodolfo Notaro, D.O. [facility] [address], [facility] [address] Cell Phone (Mon-Fri 8am-5pm):  [phone] On Call:  [phone] & follow prompts after 5pm & weekends Office Phone:  [phone] Office Fax:  [phone]

## 2020-09-06 ENCOUNTER — Other Ambulatory Visit: Payer: Self-pay | Admitting: Internal Medicine

## 2020-09-06 DIAGNOSIS — B20 Human immunodeficiency virus [HIV] disease: Secondary | ICD-10-CM

## 2020-10-05 ENCOUNTER — Other Ambulatory Visit: Payer: Self-pay | Admitting: Nurse Practitioner

## 2020-10-05 DIAGNOSIS — I10 Essential (primary) hypertension: Secondary | ICD-10-CM

## 2020-10-09 ENCOUNTER — Other Ambulatory Visit: Payer: Self-pay

## 2020-10-10 ENCOUNTER — Ambulatory Visit: Payer: Self-pay

## 2020-10-10 ENCOUNTER — Other Ambulatory Visit: Payer: Self-pay

## 2020-10-10 DIAGNOSIS — B20 Human immunodeficiency virus [HIV] disease: Secondary | ICD-10-CM

## 2020-10-18 LAB — COMPREHENSIVE METABOLIC PANEL
AG Ratio: 1.2 (calc) (ref 1.0–2.5)
ALT: 10 U/L (ref 9–46)
AST: 17 U/L (ref 10–35)
Albumin: 4.1 g/dL (ref 3.6–5.1)
Alkaline phosphatase (APISO): 98 U/L (ref 35–144)
BUN: 8 mg/dL (ref 7–25)
CO2: 28 mmol/L (ref 20–32)
Calcium: 9.3 mg/dL (ref 8.6–10.3)
Chloride: 103 mmol/L (ref 98–110)
Creat: 0.77 mg/dL (ref 0.70–1.33)
Globulin: 3.5 g/dL (calc) (ref 1.9–3.7)
Glucose, Bld: 98 mg/dL (ref 65–99)
Potassium: 4.2 mmol/L (ref 3.5–5.3)
Sodium: 139 mmol/L (ref 135–146)
Total Bilirubin: 0.3 mg/dL (ref 0.2–1.2)
Total Protein: 7.6 g/dL (ref 6.1–8.1)

## 2020-10-18 LAB — CBC
HCT: 43.5 % (ref 38.5–50.0)
Hemoglobin: 14.6 g/dL (ref 13.2–17.1)
MCH: 29.8 pg (ref 27.0–33.0)
MCHC: 33.6 g/dL (ref 32.0–36.0)
MCV: 88.8 fL (ref 80.0–100.0)
MPV: 10 fL (ref 7.5–12.5)
Platelets: 326 10*3/uL (ref 140–400)
RBC: 4.9 10*6/uL (ref 4.20–5.80)
RDW: 13.5 % (ref 11.0–15.0)
WBC: 10.4 10*3/uL (ref 3.8–10.8)

## 2020-10-18 LAB — HIV-1 RNA QUANT-NO REFLEX-BLD
HIV 1 RNA Quant: <20 Copies/mL
HIV-1 RNA Quant, Log: <1.3 Log cps/mL

## 2020-10-18 LAB — LIPID PANEL
Cholesterol: 191 mg/dL (ref <200)
HDL: 56 mg/dL (ref >=40)
LDL Cholesterol (Calc): 99 mg/dL (calc)
Non-HDL Cholesterol (Calc): 135 mg/dL (calc) — ABNORMAL HIGH (ref <130)
Total CHOL/HDL Ratio: 3.4 (calc) (ref <5.0)
Triglycerides: 249 mg/dL — ABNORMAL HIGH (ref <150)

## 2020-10-18 LAB — RPR: RPR Ser Ql: NONREACTIVE

## 2020-10-24 ENCOUNTER — Other Ambulatory Visit: Payer: Self-pay

## 2020-10-24 ENCOUNTER — Encounter: Payer: Self-pay | Admitting: Internal Medicine

## 2020-10-24 ENCOUNTER — Telehealth (INDEPENDENT_AMBULATORY_CARE_PROVIDER_SITE_OTHER): Payer: Self-pay | Admitting: Internal Medicine

## 2020-10-24 DIAGNOSIS — B20 Human immunodeficiency virus [HIV] disease: Secondary | ICD-10-CM

## 2020-10-24 MED ORDER — ODEFSEY 200-25-25 MG PO TABS: 1.0000 | ORAL_TABLET | Freq: Every day | ORAL | 11 refills | Status: DC

## 2020-10-24 NOTE — Progress Notes (Signed)
Virtual Visit via Telephone Note  I connected with Brandon Quinn on 10/24/20 at  3:30 PM EST by telephone and verified that I am speaking with the correct person using two identifiers.  Location: Patient: Home Provider: [facility]   I discussed the limitations, risks, security and privacy concerns of performing an evaluation and management service by telephone and the availability of in person appointments. I also discussed with the patient that there may be a patient responsible charge related to this service. The patient expressed understanding and agreed to proceed.   History of Present Illness: I called and spoke with Brandon Quinn today.  He denies any problems obtaining, taking or tolerating his Odefsey.  He does not recall missing any doses.  He is feeling well.  He has not been working during the [employer] pandemic.  He has finally decided to get an appointment to get the Pfizer COVID vaccines.   Observations/Objective: HIV 1 RNA Quant  Date Value  10/10/2020 <20 Copies/mL  03/21/2020 <20 NOT DETECTED copies/mL  07/20/2019 <20 NOT DETECTED copies/mL   CD4 T Cell Abs (/uL)  Date Value  03/21/2020 450  07/20/2019 461  01/19/2019 390 (L)    Assessment and Plan: His infection remains under excellent, long-term control.  Follow Up Instructions: Continue Odefsey Recommended to proceed with COVID vaccination Follow-up after lab work in 6 months   I discussed the assessment and treatment plan with the patient. The patient was provided an opportunity to ask questions and all were answered. The patient agreed with the plan and demonstrated an understanding of the instructions.   The patient was advised to call back or seek an in-person evaluation if the symptoms worsen or if the condition fails to improve as anticipated.  I provided 14 minutes of non-face-to-face time during this encounter.   Cliffton Asters, MD

## 2020-11-04 ENCOUNTER — Other Ambulatory Visit: Payer: Self-pay | Admitting: Nurse Practitioner

## 2020-11-04 DIAGNOSIS — I10 Essential (primary) hypertension: Secondary | ICD-10-CM

## 2020-11-07 ENCOUNTER — Other Ambulatory Visit: Payer: Self-pay | Admitting: *Deleted

## 2020-11-07 MED ORDER — LISINOPRIL 5 MG PO TABS: 5.0000 mg | ORAL_TABLET | Freq: Every day | ORAL | 1 refills | Status: DC

## 2020-11-07 NOTE — Telephone Encounter (Signed)
Patient requested refill. Confirmed dosage of Lisinopril 5mg 

## 2020-12-06 ENCOUNTER — Encounter: Payer: Self-pay | Admitting: Internal Medicine

## 2020-12-25 ENCOUNTER — Telehealth: Payer: Self-pay

## 2020-12-25 NOTE — Telephone Encounter (Signed)
Patient called to find out why pharmacy was only giving him 30 day supply refill and not the 90 he had requested I called the pharmacy and they said his insurance would only cover 30 days at a time and not the 90 days he had requested I called patient back and informed him of this and he said this was fine and I recalled the pharmacy for him to tell pharmacist that he needed a refill on his Lisinopril and he would pick it up this evening around 8 o'clock pm

## 2021-01-29 ENCOUNTER — Ambulatory Visit: Payer: Self-pay | Admitting: Nurse Practitioner

## 2021-01-29 ENCOUNTER — Other Ambulatory Visit: Payer: Self-pay

## 2021-02-16 ENCOUNTER — Telehealth: Payer: Self-pay | Admitting: *Deleted

## 2021-02-16 DIAGNOSIS — I10 Essential (primary) hypertension: Secondary | ICD-10-CM

## 2021-02-16 MED ORDER — METOPROLOL SUCCINATE ER 50 MG PO TB24: 50.0000 mg | ORAL_TABLET | Freq: Every day | ORAL | 0 refills | Status: DC

## 2021-02-16 MED ORDER — HYDRALAZINE HCL 25 MG PO TABS: 25.0000 mg | ORAL_TABLET | Freq: Three times a day (TID) | ORAL | 0 refills | Status: DC

## 2021-02-16 MED ORDER — LISINOPRIL 5 MG PO TABS: 5.0000 mg | ORAL_TABLET | Freq: Every day | ORAL | 0 refills | Status: DC

## 2021-02-16 NOTE — Telephone Encounter (Signed)
Patient called Clinical Intake VERY upset.  Stated that he came to his last appointment and was turned away because he did not have any Insurance. Stated that "the girl" up front was very hateful and rude to him and kicked him out and wouldn't let him see the Provider because of no insurance.  Stated he doesn't have any blood pressure medication now because of this. He Told Ladona Ridgel that his family was going to sue "that girl up front" if anything happened to him.   I transferred him to Ladona Ridgel to get the insurance correct and Ladona Ridgel transferred him to [employer], Ronda's, Voicemail to leave a message for a return call.   I called patient back and I confirmed and refilled his medications for blood pressure and he will reschedule an appointment after he speaks with [facility]   Chrae also made aware.

## 2021-03-21 ENCOUNTER — Other Ambulatory Visit: Payer: Self-pay

## 2021-03-21 ENCOUNTER — Encounter: Payer: Self-pay | Admitting: Family

## 2021-03-21 ENCOUNTER — Ambulatory Visit (INDEPENDENT_AMBULATORY_CARE_PROVIDER_SITE_OTHER): Payer: Self-pay | Admitting: Family

## 2021-03-21 VITALS — BP 130/60 | HR 80 | Temp 97.1°F | Resp 16 | Ht 69.0 in | Wt 195.0 lb

## 2021-03-21 DIAGNOSIS — J42 Unspecified chronic bronchitis: Secondary | ICD-10-CM

## 2021-03-21 DIAGNOSIS — E782 Mixed hyperlipidemia: Secondary | ICD-10-CM

## 2021-03-21 DIAGNOSIS — I10 Essential (primary) hypertension: Secondary | ICD-10-CM

## 2021-03-21 DIAGNOSIS — F17209 Nicotine dependence, unspecified, with unspecified nicotine-induced disorders: Secondary | ICD-10-CM

## 2021-03-21 DIAGNOSIS — B20 Human immunodeficiency virus [HIV] disease: Secondary | ICD-10-CM

## 2021-03-21 DIAGNOSIS — Z6828 Body mass index (BMI) 28.0-28.9, adult: Secondary | ICD-10-CM

## 2021-03-21 MED ORDER — LISINOPRIL 5 MG PO TABS: 5.0000 mg | ORAL_TABLET | Freq: Every day | ORAL | 1 refills | Status: DC

## 2021-03-21 MED ORDER — HYDRALAZINE HCL 25 MG PO TABS: 25.0000 mg | ORAL_TABLET | Freq: Three times a day (TID) | ORAL | 1 refills | Status: DC

## 2021-03-21 MED ORDER — METOPROLOL SUCCINATE ER 50 MG PO TB24: 50.0000 mg | ORAL_TABLET | Freq: Every day | ORAL | 1 refills | Status: DC

## 2021-03-21 NOTE — Progress Notes (Signed)
Provider: Kester Stimpson FNP-C   Sharon SellerEubanks, Jessica K, NP  Patient Care Team: Sharon SellerEubanks, Jessica K, NP as PCP - General (Geriatric Medicine) Cliffton Astersampbell, John, MD as Consulting Physician (Infectious Diseases)  Extended Emergency Contact Information Primary Emergency Contact: Salifou,Garba Address: [address]          [place], [place] [id number] [place] Mobile Phone: [phone] Relation: Brother Secondary Emergency Contact: Kone,Aidriss  [place] Mobile Phone: [phone] Relation: Friend  Code Status:  Full Code  Goals of care: Advanced Directive information Advanced Directives 03/21/2021  Does Patient Have a Medical Advance Directive? No  Would patient like information on creating a medical advance directive? No - Patient declined     Chief Complaint  Patient presents with   Medical Management of Chronic Issues    7 month follow up on BP and medications.     HPI:  Pt is a 55 y.o. male seen today for 7 months follow up for medical management of chronic diseases.He hypertension,Hyperlipidemia,Gout,HIV among others.He denies any acute issues this visit.He continues to follow up with Infectious disease for HIV. He request refill for his blood pressure medication.No home blood pressure readings for evaluation. He denies any headache,dizziness,vision changes,fatigue,chest tightness,palpitation,chest pain or shortness of breath.     States has up coming appointment for blood work to be done by infectious disease no fasting labs desired today.Noted lab work ordered in [employer] [facility].  Has had no recent hospitalization. States continues to smoke cigarette 1/2 a pack per day    Past Medical History:  Diagnosis Date   Gout    HIV infection (HCC)    Hyperlipidemia    Hypertension    Substance abuse (HCC)    alcohol    Tobacco abuse    Past Surgical History:  Procedure Laterality Date   WRIST FRACTURE SURGERY  ~ 2009   right; "put a plate in it"  (13/08/657810/24/2013)    Allergies  Allergen Reactions   Genvoya [Elviteg-Cobic-Emtricit-Tenofaf] Rash    Allergies as of 03/21/2021       Reactions   Genvoya [elviteg-cobic-emtricit-tenofaf] Rash        Medication List        Accurate as of March 21, 2021  2:24 PM. If you have any questions, ask your nurse or doctor.          aspirin 81 MG chewable tablet Commonly known as: Aspirin 81 Chew 1 tablet (81 mg total) by mouth daily.   atorvastatin 80 MG tablet Commonly known as: LIPITOR Take 1 tablet (80 mg total) by mouth daily.   atorvastatin 40 MG tablet Commonly known as: LIPITOR Take 40 mg by mouth daily.   hydrALAZINE 25 MG tablet Commonly known as: APRESOLINE Take 1 tablet (25 mg total) by mouth 3 (three) times daily.   lisinopril 5 MG tablet Commonly known as: ZESTRIL Take 1 tablet (5 mg total) by mouth daily.   metoprolol succinate 50 MG 24 hr tablet Commonly known as: TOPROL-XL Take 1 tablet (50 mg total) by mouth daily.   Odefsey 200-25-25 MG Tabs tablet Generic drug: emtricitabine-rilpivir-tenofovir AF Take 1 tablet by mouth daily.   sildenafil 25 MG tablet Commonly known as: Viagra Take 1 tablet (25 mg total) by mouth daily as needed for erectile dysfunction.        Review of Systems  Constitutional:  Negative for appetite change, chills, fatigue, fever and unexpected weight change.  HENT:  Negative for congestion, dental problem, ear discharge, ear  pain, facial swelling, hearing loss, nosebleeds, postnasal drip, rhinorrhea, sinus pressure, sinus pain, sneezing, sore throat, tinnitus and trouble swallowing.   Eyes:  Negative for pain, discharge, redness, itching and visual disturbance.  Respiratory:  Negative for cough, chest tightness, shortness of breath and wheezing.   Cardiovascular:  Negative for chest pain, palpitations and leg swelling.  Gastrointestinal:  Negative for abdominal distention, abdominal pain, blood in stool, constipation,  diarrhea, nausea and vomiting.  Endocrine: Negative for cold intolerance, heat intolerance, polydipsia, polyphagia and polyuria.  Genitourinary:  Negative for difficulty urinating, dysuria, flank pain, frequency and urgency.  Musculoskeletal:  Negative for arthralgias, back pain, gait problem, joint swelling, myalgias, neck pain and neck stiffness.  Skin:  Negative for color change, pallor, rash and wound.  Neurological:  Negative for dizziness, syncope, speech difficulty, weakness, light-headedness, numbness and headaches.  Hematological:  Does not bruise/bleed easily.  Psychiatric/Behavioral:  Negative for agitation, behavioral problems, confusion, hallucinations, self-injury, sleep disturbance and suicidal ideas. The patient is not nervous/anxious.    Immunization History  Administered Date(s) Administered   Influenza,inj,Quad PF,6+ Mos 06/27/2015, 07/29/2016, 07/20/2019   Influenza-Unspecified 08/11/2020   PPD Test 04/25/2015   Pneumococcal Polysaccharide-23 04/25/2015   Pertinent  Health Maintenance Due  Topic Date Due   COLONOSCOPY (Pts 45-56yrs Insurance coverage will need to be confirmed)  Never done   INFLUENZA VACCINE  05/07/2021   Fall Risk  03/21/2021 10/24/2020 08/17/2020 04/13/2020 02/04/2020  Falls in the past year? 0 0 0 0 0  Comment - - - - -  Number falls in past yr: 0 - 1 - -  Injury with Fall? 0 - 0 - 0  Comment - - - - -  Risk for fall due to : - No Fall Risks - - -  Follow up - Falls evaluation completed - Falls evaluation completed -   Functional Status Survey:    Vitals:   03/21/21 1319  BP: 130/60  Pulse: 80  Resp: 16  Temp: (!) 97.1 F (36.2 C)  SpO2: 97%  Weight: 195 lb (88.5 kg)  Height: 5\' 9"  (1.753 m)   Body mass index is 28.8 kg/m. Physical Exam Vitals reviewed.  Constitutional:      General: He is not in acute distress.    Appearance: Normal appearance. He is normal weight. He is not ill-appearing or diaphoretic.  HENT:     Head:  Normocephalic.     Right Ear: Tympanic membrane, ear canal and external ear normal. There is no impacted cerumen.     Left Ear: Tympanic membrane, ear canal and external ear normal. There is no impacted cerumen.     Nose: Nose normal. No congestion or rhinorrhea.     Mouth/Throat:     Mouth: Mucous membranes are moist.     Pharynx: Oropharynx is clear. No oropharyngeal exudate or posterior oropharyngeal erythema.  Eyes:     General: No scleral icterus.       Right eye: No discharge.        Left eye: No discharge.     Extraocular Movements: Extraocular movements intact.     Conjunctiva/sclera: Conjunctivae normal.     Pupils: Pupils are equal, round, and reactive to light.  Neck:     Vascular: No carotid bruit.  Cardiovascular:     Rate and Rhythm: Normal rate and regular rhythm.     Pulses: Normal pulses.     Heart sounds: Normal heart sounds. No murmur heard.   No friction rub. No gallop.  Pulmonary:     Effort: Pulmonary effort is normal. No respiratory distress.     Breath sounds: Normal breath sounds. No wheezing, rhonchi or rales.  Chest:     Chest wall: No tenderness.  Abdominal:     General: Bowel sounds are normal. There is no distension.     Palpations: Abdomen is soft. There is no mass.     Tenderness: There is no abdominal tenderness. There is no right CVA tenderness, left CVA tenderness, guarding or rebound.  Musculoskeletal:        General: No swelling or tenderness. Normal range of motion.     Cervical back: Normal range of motion. No rigidity or tenderness.     Right lower leg: No edema.     Left lower leg: No edema.  Lymphadenopathy:     Cervical: No cervical adenopathy.  Skin:    General: Skin is warm and dry.     Coloration: Skin is not pale.     Findings: No bruising, erythema, lesion or rash.  Neurological:     Mental Status: He is alert and oriented to person, place, and time.     Cranial Nerves: No cranial nerve deficit.     Sensory: No sensory  deficit.     Motor: No weakness.     Coordination: Coordination normal.     Gait: Gait normal.  Psychiatric:        Mood and Affect: Mood normal.        Speech: Speech normal.        Behavior: Behavior normal.        Thought Content: Thought content normal.        Judgment: Judgment normal.    Labs reviewed: Recent Labs    10/10/20 1521  NA 139  K 4.2  CL 103  CO2 28  GLUCOSE 98  BUN 8  CREATININE 0.77  CALCIUM 9.3   Recent Labs    10/10/20 1521  AST 17  ALT 10  BILITOT 0.3  PROT 7.6   Recent Labs    10/10/20 1521  WBC 10.4  HGB 14.6  HCT 43.5  MCV 88.8  PLT 326   Lab Results  Component Value Date   TSH 0.715 07/29/2012   Lab Results  Component Value Date   HGBA1C 5.9 (H) 02/27/2018   Lab Results  Component Value Date   CHOL 191 10/10/2020   HDL 56 10/10/2020   LDLCALC 99 10/10/2020   TRIG 249 (H) 10/10/2020   CHOLHDL 3.4 10/10/2020    Significant Diagnostic Results in last 30 days:  No results found.  Assessment/Plan 1. Essential hypertension B/p well controlled. - continue on Hydralazine,metoprolol succinate and Lisinopril  - hydrALAZINE (APRESOLINE) 25 MG tablet; Take 1 tablet (25 mg total) by mouth 3 (three) times daily.  Dispense: 270 tablet; Refill: 1 - lisinopril (ZESTRIL) 5 MG tablet; Take 1 tablet (5 mg total) by mouth daily.  Dispense: 90 tablet; Refill: 1 - metoprolol succinate (TOPROL-XL) 50 MG 24 hr tablet; Take 1 tablet (50 mg total) by mouth daily.  Dispense: 90 tablet; Refill: 1  2. Tobacco use disorder, continuous Smokes 1/2 pack/day not ready to quit. Smoking cessation encouraged.  3. Body mass index 28.0-28.9, adult BMI 28.8  -dietary modification   4. Mixed hyperlipidemia Latest LDL at goal - continue on Atorvastatin   5. HIV disease (HCC) Continue to follow up with infectious disease   6. Chronic bronchitis, unspecified chronic bronchitis type (HCC) Breathing stable  Family/ staff Communication: Reviewed  plan of care with patient verbalized understanding   Labs/tests ordered: Has lab work to be done by infectious disease.   Next Appointment : 6 months for medical management of chronic issues.   Caesar Bookman, NP

## 2021-03-21 NOTE — Patient Instructions (Signed)

## 2021-04-01 DIAGNOSIS — Z6828 Body mass index (BMI) 28.0-28.9, adult: Secondary | ICD-10-CM | POA: Insufficient documentation

## 2021-04-10 ENCOUNTER — Other Ambulatory Visit: Payer: Self-pay

## 2021-04-10 DIAGNOSIS — B20 Human immunodeficiency virus [HIV] disease: Secondary | ICD-10-CM

## 2021-04-11 LAB — T-HELPER CELL (CD4) - (RCID CLINIC ONLY)
CD4 % Helper T Cell: 12 % — ABNORMAL LOW (ref 33–65)
CD4 T Cell Abs: 552 /uL (ref 400–1790)

## 2021-04-12 LAB — COMPREHENSIVE METABOLIC PANEL
AG Ratio: 1.2 (calc) (ref 1.0–2.5)
ALT: 8 U/L — ABNORMAL LOW (ref 9–46)
AST: 17 U/L (ref 10–35)
Albumin: 3.8 g/dL (ref 3.6–5.1)
Alkaline phosphatase (APISO): 88 U/L (ref 35–144)
BUN: 10 mg/dL (ref 7–25)
CO2: 22 mmol/L (ref 20–32)
Calcium: 8.9 mg/dL (ref 8.6–10.3)
Chloride: 106 mmol/L (ref 98–110)
Creat: 1 mg/dL (ref 0.70–1.33)
Globulin: 3.2 g/dL (calc) (ref 1.9–3.7)
Glucose, Bld: 110 mg/dL — ABNORMAL HIGH (ref 65–99)
Potassium: 4 mmol/L (ref 3.5–5.3)
Sodium: 137 mmol/L (ref 135–146)
Total Bilirubin: 0.5 mg/dL (ref 0.2–1.2)
Total Protein: 7 g/dL (ref 6.1–8.1)

## 2021-04-12 LAB — CBC
HCT: 43.4 % (ref 38.5–50.0)
Hemoglobin: 14.6 g/dL (ref 13.2–17.1)
MCH: 30.2 pg (ref 27.0–33.0)
MCHC: 33.6 g/dL (ref 32.0–36.0)
MCV: 89.7 fL (ref 80.0–100.0)
MPV: 10.7 fL (ref 7.5–12.5)
Platelets: 230 10*3/uL (ref 140–400)
RBC: 4.84 10*6/uL (ref 4.20–5.80)
RDW: 14.4 % (ref 11.0–15.0)
WBC: 9 10*3/uL (ref 3.8–10.8)

## 2021-04-12 LAB — LIPID PANEL
Cholesterol: 242 mg/dL — ABNORMAL HIGH (ref <200)
HDL: 59 mg/dL (ref >=40)
LDL Cholesterol (Calc): 158 mg/dL (calc) — ABNORMAL HIGH
Non-HDL Cholesterol (Calc): 183 mg/dL (calc) — ABNORMAL HIGH (ref <130)
Total CHOL/HDL Ratio: 4.1 (calc) (ref <5.0)
Triglycerides: 125 mg/dL (ref <150)

## 2021-04-12 LAB — RPR: RPR Ser Ql: NONREACTIVE

## 2021-04-12 LAB — HIV-1 RNA QUANT-NO REFLEX-BLD
HIV 1 RNA Quant: NOT DETECTED Copies/mL
HIV-1 RNA Quant, Log: NOT DETECTED Log cps/mL

## 2021-04-24 ENCOUNTER — Other Ambulatory Visit: Payer: Self-pay

## 2021-04-24 ENCOUNTER — Encounter: Payer: Self-pay | Admitting: Internal Medicine

## 2021-04-24 ENCOUNTER — Ambulatory Visit: Payer: Self-pay

## 2021-04-24 ENCOUNTER — Ambulatory Visit (INDEPENDENT_AMBULATORY_CARE_PROVIDER_SITE_OTHER): Payer: Self-pay | Admitting: Internal Medicine

## 2021-04-24 DIAGNOSIS — B20 Human immunodeficiency virus [HIV] disease: Secondary | ICD-10-CM

## 2021-04-24 MED ORDER — ODEFSEY 200-25-25 MG PO TABS: 1.0000 | ORAL_TABLET | Freq: Every day | ORAL | 11 refills | Status: DC

## 2021-04-24 NOTE — Assessment & Plan Note (Signed)
His infection remains under excellent, long-term control.  He will continue Odefsey and follow-up after lab work in 1 year. ?

## 2021-04-24 NOTE — Progress Notes (Signed)
[facility]  Patient Active Problem List   Diagnosis Date Noted   HIV disease (HCC) 05/18/2015    Priority: High   Body mass index 28.0-28.9, adult 04/01/2021   Lip lesion 04/13/2020   Poor dentition 04/13/2020   Rectal pain 04/13/2020   Chronic bronchitis (HCC) 01/03/2020   Alcohol abuse 06/01/2017   Pain, dental 07/29/2016   Acute gouty arthritis 01/30/2015   Tobacco abuse 07/30/2012   Hypertension 07/29/2012    Patient's Medications  New Prescriptions   No medications on file  Previous Medications   ASPIRIN (ASPIRIN 81) 81 MG CHEWABLE TABLET    Chew 1 tablet (81 mg total) by mouth daily.   ATORVASTATIN (LIPITOR) 40 MG TABLET    Take 40 mg by mouth daily.   ATORVASTATIN (LIPITOR) 80 MG TABLET    Take 1 tablet (80 mg total) by mouth daily.   HYDRALAZINE (APRESOLINE) 25 MG TABLET    Take 1 tablet (25 mg total) by mouth 3 (three) times daily.   LISINOPRIL (ZESTRIL) 5 MG TABLET    Take 1 tablet (5 mg total) by mouth daily.   METOPROLOL SUCCINATE (TOPROL-XL) 50 MG 24 HR TABLET    Take 1 tablet (50 mg total) by mouth daily.   SILDENAFIL (VIAGRA) 25 MG TABLET    Take 1 tablet (25 mg total) by mouth daily as needed for erectile dysfunction.  Modified Medications   Modified Medication Previous Medication   EMTRICITABINE-RILPIVIR-TENOFOVIR AF (ODEFSEY) 200-25-25 MG TABS TABLET emtricitabine-rilpivir-tenofovir AF (ODEFSEY) 200-25-25 MG TABS tablet      Take 1 tablet by mouth daily.    Take 1 tablet by mouth daily.  Discontinued Medications   No medications on file    Subjective: Brandon Quinn is seen for his routine HIV follow-up visit.  He denies any problems obtaining, taking or tolerating his Odefsey.  He takes it each afternoon around 4 PM.  He has not missed any doses.  He quit smoking cigarettes last week.  He is feeling well.  Review of Systems: Review of Systems  Constitutional:  Negative for chills, diaphoresis, fever and weight loss.   Psychiatric/Behavioral:  Negative for depression.    Past Medical History:  Diagnosis Date   Gout    HIV infection (HCC)    Hyperlipidemia    Hypertension    Substance abuse (HCC)    alcohol    Tobacco abuse     Social History   Tobacco Use   Smoking status: Every Day    Packs/day: 0.50    Years: 26.00    Pack years: 13.00    Types: Cigarettes   Smokeless tobacco: Never  Vaping Use   Vaping Use: Never used  Substance Use Topics   Alcohol use: Yes    Comment: couple beers a day   Drug use: Never    Family History  Problem Relation Age of Onset   Other Other        No known heart disease    Allergies  Allergen Reactions   Genvoya [Elviteg-Cobic-Emtricit-Tenofaf] Rash    Objective: Vitals:   04/24/21 1411  BP: (!) 158/73  Pulse: (!) 105  Temp: 98 F (36.7 C)  TempSrc: Oral  Weight: 196 lb (88.9 kg)   Body mass index is 28.94 kg/m.  Physical Exam Constitutional:      Comments: He is in good spirits.  Cardiovascular:     Rate and Rhythm: Normal rate.  Pulmonary:  Effort: Pulmonary effort is normal.  Psychiatric:        Mood and Affect: Mood normal.    Lab Results    Problem List Items Addressed This Visit       High   HIV disease (HCC)    His infection remains under excellent, long-term control.  He will continue Odefsey and follow-up after lab work in 1 year.       Relevant Medications   emtricitabine-rilpivir-tenofovir AF (ODEFSEY) 200-25-25 MG TABS tablet   Other Relevant Orders   CBC   T-helper cell (CD4)- ([facility] clinic only)   Comprehensive metabolic panel   Lipid panel   RPR   HIV-1 RNA quant-no reflex-bld     Cliffton Asters, MD [facility] [phone] pager   [phone] cell 04/24/2021, 2:32 PM

## 2021-05-18 ENCOUNTER — Telehealth: Payer: Self-pay

## 2021-05-18 NOTE — Telephone Encounter (Signed)
Incoming call received from patient requesting an explantation as to why we have not refilled his lisinopril, patient states he keeps having issues with his refills.  I explained to patient that according to our records he should have a refill on file at his pharmacy.  Patient states he would also like to know why we refilled for 30 versus 90 the last time. I explained to patient that we approved for 90 and the pharmacy would need to inform him as to why they only dispensed 30. I shared my 17 years of experience with the patient an told him usually in these situations the insurance company dictates how many pills will be approved per refill.   I informed patient that I will call his pharmacy to confirm rx was received in June (when we last approved) and to see if they can explain why #30 was given versus 90.  Called the pharmacy, spoke with [facility] who informed me patient has 4 refills waiting for pick-up and Lisinopril is one of them. Kayla explained that #30 is dispensed as that is the amount the insurance is willing to cover every 30 days. Dorathy Daft tried to run rx for 90 day supply at the time of call to make sure and received a rejection message.  I returned call to patient and informed him of my encounter with Kayla. Patient appeared satisfied with how his message was handled and plans to pick up rx's today.

## 2021-07-17 ENCOUNTER — Encounter: Payer: Self-pay | Admitting: Internal Medicine

## 2021-07-18 ENCOUNTER — Other Ambulatory Visit: Payer: Self-pay | Admitting: *Deleted

## 2021-07-18 DIAGNOSIS — I10 Essential (primary) hypertension: Secondary | ICD-10-CM

## 2021-07-18 MED ORDER — METOPROLOL SUCCINATE ER 50 MG PO TB24
50.0000 mg | ORAL_TABLET | Freq: Every day | ORAL | 1 refills | Status: DC
Start: 2021-07-18 — End: 2021-09-19

## 2021-07-18 MED ORDER — HYDRALAZINE HCL 25 MG PO TABS
25.0000 mg | ORAL_TABLET | Freq: Three times a day (TID) | ORAL | 1 refills | Status: DC
Start: 2021-07-18 — End: 2021-09-19

## 2021-07-18 MED ORDER — LISINOPRIL 5 MG PO TABS: 5.0000 mg | ORAL_TABLET | Freq: Every day | ORAL | 1 refills | Status: DC

## 2021-07-18 NOTE — Telephone Encounter (Signed)
Patient requested refills.

## 2021-07-23 ENCOUNTER — Telehealth: Payer: Self-pay

## 2021-07-23 NOTE — Telephone Encounter (Signed)
Patient called to inquire about ADAP application and if it's been approved. Reports he called last week to follow up. Application was received but has not been approved. Patient requested to be notified when approval has been given.  Shaylan Tutton Loyola Mast, RN

## 2021-07-24 NOTE — Telephone Encounter (Signed)
Patient called back to follow up on status for his financial assistance. Informed patient that status is still pending. Will know more information once Financial counselor returns to office.  Patient is concerned about his BP medication. States that he has not been able to take any until ADAP is approved. Medication cost is too high for him to pay out of pocket. Requested he call PCP for assistance getting his BP medication.  Juanita Laster, RMA

## 2021-09-19 ENCOUNTER — Other Ambulatory Visit: Payer: Self-pay

## 2021-09-19 ENCOUNTER — Ambulatory Visit (INDEPENDENT_AMBULATORY_CARE_PROVIDER_SITE_OTHER): Payer: Self-pay | Admitting: Family

## 2021-09-19 ENCOUNTER — Encounter: Payer: Self-pay | Admitting: Family

## 2021-09-19 VITALS — BP 170/78 | HR 73 | Temp 97.9°F | Resp 16 | Ht 69.0 in | Wt 193.0 lb

## 2021-09-19 DIAGNOSIS — I10 Essential (primary) hypertension: Secondary | ICD-10-CM

## 2021-09-19 DIAGNOSIS — E782 Mixed hyperlipidemia: Secondary | ICD-10-CM

## 2021-09-19 DIAGNOSIS — E663 Overweight: Secondary | ICD-10-CM

## 2021-09-19 DIAGNOSIS — F17209 Nicotine dependence, unspecified, with unspecified nicotine-induced disorders: Secondary | ICD-10-CM

## 2021-09-19 DIAGNOSIS — Z6828 Body mass index (BMI) 28.0-28.9, adult: Secondary | ICD-10-CM

## 2021-09-19 MED ORDER — METOPROLOL SUCCINATE ER 50 MG PO TB24: 50.0000 mg | ORAL_TABLET | Freq: Every day | ORAL | 1 refills | Status: DC

## 2021-09-19 MED ORDER — HYDRALAZINE HCL 50 MG PO TABS: 50.0000 mg | ORAL_TABLET | Freq: Three times a day (TID) | ORAL | 0 refills | Status: DC

## 2021-09-19 MED ORDER — ATORVASTATIN CALCIUM 40 MG PO TABS: 40.0000 mg | ORAL_TABLET | Freq: Every day | ORAL | 3 refills | Status: DC

## 2021-09-19 MED ORDER — LISINOPRIL 5 MG PO TABS: 5.0000 mg | ORAL_TABLET | Freq: Every day | ORAL | 1 refills | Status: DC

## 2021-09-19 NOTE — Patient Instructions (Addendum)
-  check Blood pressure at home and record on log provided and notify provider if B/p > 140/90   - Increase Hydralazine from 25 mg tablet three times daily to 50 mg tablet one by mouth three times daily   Smoking Tobacco Information, Adult Smoking tobacco can be harmful to your health. Tobacco contains a poisonous (toxic), colorless chemical called nicotine. Nicotine is addictive. It changes the brain and can make it hard to stop smoking. Tobacco also has other toxic chemicals that can hurt your body and raise your risk of many cancers. How can smoking tobacco affect me? Smoking tobacco puts you at risk for: Cancer. Smoking is most commonly associated with lung cancer, but can also lead to cancer in other parts of the body. Chronic obstructive pulmonary disease (COPD). This is a long-term lung condition that makes it hard to breathe. It also gets worse over time. High blood pressure (hypertension), heart disease, stroke, or heart attack. Lung infections, such as pneumonia. Cataracts. This is when the lenses in the eyes become clouded. Digestive problems. This may include peptic ulcers, heartburn, and gastroesophageal reflux disease (GERD). Oral health problems, such as gum disease and tooth loss. Loss of taste and smell. Smoking can affect your appearance by causing: Wrinkles. Yellow or stained teeth, fingers, and fingernails. Smoking tobacco can also affect your social life, because: It may be challenging to find places to smoke when away from home. Many workplaces, [employer], hotels, and public places are tobacco-free. Smoking is expensive. This is due to the cost of tobacco and the long-term costs of treating health problems from smoking. Secondhand smoke may affect those around you. Secondhand smoke can cause lung cancer, breathing problems, and heart disease. Children of smokers have a higher risk for: Sudden infant death syndrome (SIDS). Ear infections. Lung infections. If you  currently smoke tobacco, quitting now can help you: Lead a longer and healthier life. Look, smell, breathe, and feel better over time. Save money. Protect others from the harms of secondhand smoke. What actions can I take to prevent health problems? Quit smoking  Do not start smoking. Quit if you already do. Make a plan to quit smoking and commit to it. Look for programs to help you and ask your health care provider for recommendations and ideas. Set a date and write down all the reasons you want to quit. Let your friends and family know you are quitting so they can help and support you. Consider finding friends who also want to quit. It can be easier to quit with someone else, so that you can support each other. Talk with your health care provider about using nicotine replacement medicines to help you quit, such as gum, lozenges, patches, sprays, or pills. Do not replace cigarette smoking with electronic cigarettes, which are commonly called e-cigarettes. The safety of e-cigarettes is not known, and some may contain harmful chemicals. If you try to quit but return to smoking, stay positive. It is common to slip up when you first quit, so take it one day at a time. Be prepared for cravings. When you feel the urge to smoke, chew gum or suck on hard candy. Lifestyle Stay busy and take care of your body. Drink enough fluid to keep your urine pale yellow. Get plenty of exercise and eat a healthy diet. This can help prevent weight gain after quitting. Monitor your eating habits. Quitting smoking can cause you to have a larger appetite than when you smoke. Find ways to relax. Go out  with friends or family to a movie or a restaurant where people do not smoke. Ask your health care provider about having regular tests (screenings) to check for cancer. This may include blood tests, imaging tests, and other tests. Find ways to manage your stress, such as meditation, yoga, or exercise. Where to find  support To get support to quit smoking, consider: Asking your health care provider for more information and resources. Taking classes to learn more about quitting smoking. Looking for local organizations that offer resources about quitting smoking. Joining a support group for people who want to quit smoking in your local community. Calling the smokefree.gov counselor helpline: 1-800-Quit-Now 276-003-6588) Where to find more information You may find more information about quitting smoking from: HelpGuide.org: www.helpguide.org BankRights[employer]: smokefree.gov American Lung Association: www.lung.org Contact a health care provider if you: Have problems breathing. Notice that your lips, nose, or fingers turn blue. Have chest pain. Are coughing up blood. Feel faint or you pass out. Have other health changes that cause you to worry. Summary Smoking tobacco can negatively affect your health, the health of those around you, your finances, and your social life. Do not start smoking. Quit if you already do. If you need help quitting, ask your health care provider. Think about joining a support group for people who want to quit smoking in your local community. There are many effective programs that will help you to quit this behavior. This information is not intended to replace advice given to you by your health care provider. Make sure you discuss any questions you have with your health care provider. Document Revised: 05/28/2021 Document Reviewed: 08/15/2020 Elsevier Patient Education  2022 [employer].

## 2021-09-19 NOTE — Progress Notes (Signed)
Provider: Marlowe Sax FNP-C   Pearly Apachito, Nelda Bucks, NP  Patient Care Team: Corydon Schweiss, Nelda Bucks, NP as PCP - General (Family Medicine) Michel Bickers, MD as Consulting Physician (Infectious Diseases)  Extended Emergency Contact Information Primary Emergency Contact: Salifou,Garba Address: [address]          [place], [place] [address] [place] Phone: [phone] Relation: Brother Secondary Emergency Contact: Kone,Aidriss  [place] [employer] [place] Phone: [phone] Relation: Friend  Code Status:  Full Code  Goals of care: Advanced Directive information Advanced Directives 09/19/2021  Does Patient Have a Medical Advance Directive? No  Would patient like information on creating a medical advance directive? No - Patient declined     Chief Complaint  Patient presents with   Medical Management of Chronic Issues    6 month follow up.   Health Maintenance    Discuss the need for Colonoscopy.    Immunizations    Discuss the need for Tetanus vaccine, Shingrix vaccine, Pne vaccine, Influenza vaccine, and Covid vaccine.     HPI:  Pt is a 55 y.o. male seen today for 6 months follow up for medical management of chronic diseases. He denies any acute issues today. B/p readings high today states just came from work.denies any headache,dizziness,vision changes,fatigue,chest tightness,palpitation,chest pain or shortness of breath.     Sometimes has to stop and rest from the parking lot to work place.denies feeling shortness of breath.  Has cut down on drinking alcohol occasional drinks beer. Still smokers 1/2 pack per day. Recommend low dose CT scan for lung cancer screening but states having issues with insurance would like to wait for now.   Latest LDL 158,chol 242 states stopped taking atorvastatin  Declines immunization. Also due for colonoscopy but declines.  Has not had annual dental exam due to insurance.  Past Medical History:  Diagnosis  Date   Gout    HIV infection ([place])    Hyperlipidemia    Hypertension    Substance abuse ([place])    alcohol    Tobacco abuse    Past Surgical History:  Procedure Laterality Date   WRIST FRACTURE SURGERY  ~ 2009   right; "put a plate in it" (624THL)    Allergies  Allergen Reactions   Genvoya [Elviteg-Cobic-Emtricit-Tenofaf] Rash    Allergies as of 09/19/2021       Reactions   Genvoya [elviteg-cobic-emtricit-tenofaf] Rash        Medication List        Accurate as of September 19, 2021  8:39 AM. If you have any questions, ask your nurse or doctor.          aspirin 81 MG chewable tablet Commonly known as: Aspirin 81 Chew 1 tablet (81 mg total) by mouth daily.   atorvastatin 80 MG tablet Commonly known as: LIPITOR Take 1 tablet (80 mg total) by mouth daily.   atorvastatin 40 MG tablet Commonly known as: LIPITOR Take 40 mg by mouth daily.   hydrALAZINE 25 MG tablet Commonly known as: APRESOLINE Take 1 tablet (25 mg total) by mouth 3 (three) times daily.   lisinopril 5 MG tablet Commonly known as: ZESTRIL Take 1 tablet (5 mg total) by mouth daily.   metoprolol succinate 50 MG 24 hr tablet Commonly known as: TOPROL-XL Take 1 tablet (50 mg total) by mouth daily.   Odefsey 200-25-25 MG Tabs tablet Generic drug: emtricitabine-rilpivir-tenofovir AF Take 1 tablet by mouth daily.   sildenafil 25 MG tablet Commonly  known as: Viagra Take 1 tablet (25 mg total) by mouth daily as needed for erectile dysfunction.        Review of Systems  Constitutional:  Negative for appetite change, chills, fatigue, fever and unexpected weight change.  HENT:  Negative for congestion, dental problem, ear discharge, ear pain, facial swelling, hearing loss, nosebleeds, postnasal drip, rhinorrhea, sinus pressure, sinus pain, sneezing, sore throat, tinnitus and trouble swallowing.   Eyes:  Negative for pain, discharge, redness, itching and visual disturbance.  Respiratory:   Negative for cough, chest tightness, shortness of breath and wheezing.   Cardiovascular:  Negative for chest pain, palpitations and leg swelling.  Gastrointestinal:  Negative for abdominal distention, abdominal pain, blood in stool, constipation, diarrhea, nausea and vomiting.  Endocrine: Negative for cold intolerance, heat intolerance, polydipsia, polyphagia and polyuria.  Genitourinary:  Negative for difficulty urinating, dysuria, flank pain, frequency and urgency.  Musculoskeletal:  Negative for arthralgias, back pain, gait problem, joint swelling, myalgias, neck pain and neck stiffness.  Skin:  Negative for color change, pallor, rash and wound.  Neurological:  Negative for dizziness, syncope, speech difficulty, weakness, light-headedness, numbness and headaches.  Hematological:  Does not bruise/bleed easily.  Psychiatric/Behavioral:  Negative for agitation, behavioral problems, confusion, hallucinations, self-injury, sleep disturbance and suicidal ideas. The patient is not nervous/anxious.    Immunization History  Administered Date(s) Administered   Influenza,inj,Quad PF,6+ Mos 06/27/2015, 07/29/2016, 07/20/2019   Influenza-Unspecified 08/11/2020   PPD Test 04/25/2015   Pneumococcal Polysaccharide-23 04/25/2015   Pertinent  Health Maintenance Due  Topic Date Due   COLONOSCOPY (Pts 45-10yrs Insurance coverage will need to be confirmed)  Never done   INFLUENZA VACCINE  05/07/2021   Fall Risk 08/17/2020 10/24/2020 03/21/2021 04/24/2021 09/19/2021  Falls in the past year? 0 0 0 0 0  Was there an injury with Fall? 0 - 0 - 0  Was there an injury with Fall? - - - - -  Fall Risk Category Calculator 1 - 0 - 0  Fall Risk Category Low - Low - Low  Patient Fall Risk Level Low fall risk Low fall risk Low fall risk - Low fall risk  Patient at Risk for Falls Due to - No Fall Risks - - No Fall Risks  Fall risk Follow up - Falls evaluation completed - - Falls evaluation completed   Functional Status  Survey:    Vitals:   09/19/21 0835  BP: (!) 160/80  Pulse: 73  Resp: 16  Temp: 97.9 F (36.6 C)  SpO2: 97%  Weight: 193 lb (87.5 kg)  Height: 5\' 9"  (1.753 m)   Body mass index is 28.5 kg/m. Physical Exam Vitals reviewed.  Constitutional:      General: He is not in acute distress.    Appearance: Normal appearance. He is normal weight. He is not ill-appearing or diaphoretic.  HENT:     Head: Normocephalic.     Right Ear: Tympanic membrane, ear canal and external ear normal. There is no impacted cerumen.     Left Ear: Tympanic membrane, ear canal and external ear normal. There is no impacted cerumen.     Nose: Nose normal. No congestion or rhinorrhea.     Mouth/Throat:     Mouth: Mucous membranes are moist.     Pharynx: Oropharynx is clear. No oropharyngeal exudate or posterior oropharyngeal erythema.  Eyes:     General: No scleral icterus.       Right eye: No discharge.        Left  eye: No discharge.     Extraocular Movements: Extraocular movements intact.     Conjunctiva/sclera: Conjunctivae normal.     Pupils: Pupils are equal, round, and reactive to light.  Neck:     Vascular: No carotid bruit.  Cardiovascular:     Rate and Rhythm: Normal rate and regular rhythm.     Pulses: Normal pulses.     Heart sounds: Normal heart sounds. No murmur heard.   No friction rub. No gallop.  Pulmonary:     Effort: Pulmonary effort is normal. No respiratory distress.     Breath sounds: Normal breath sounds. No wheezing, rhonchi or rales.  Chest:     Chest wall: No tenderness.  Abdominal:     General: Bowel sounds are normal. There is no distension.     Palpations: Abdomen is soft. There is no mass.     Tenderness: There is no abdominal tenderness. There is no right CVA tenderness, left CVA tenderness, guarding or rebound.  Musculoskeletal:        General: No swelling or tenderness. Normal range of motion.     Cervical back: Normal range of motion. No rigidity or tenderness.      Right lower leg: No edema.     Left lower leg: No edema.  Lymphadenopathy:     Cervical: No cervical adenopathy.  Skin:    General: Skin is warm and dry.     Coloration: Skin is not pale.     Findings: No bruising, erythema, lesion or rash.  Neurological:     Mental Status: He is alert and oriented to person, place, and time.     Cranial Nerves: No cranial nerve deficit.     Sensory: No sensory deficit.     Motor: No weakness.     Coordination: Coordination normal.     Gait: Gait normal.  Psychiatric:        Mood and Affect: Mood normal.        Speech: Speech normal.        Behavior: Behavior normal.        Thought Content: Thought content normal.        Judgment: Judgment normal.    Labs reviewed: Recent Labs    10/10/20 1521 04/10/21 0939  NA 139 137  K 4.2 4.0  CL 103 106  CO2 28 22  GLUCOSE 98 110*  BUN 8 10  CREATININE 0.77 1.00  CALCIUM 9.3 8.9   Recent Labs    10/10/20 1521 04/10/21 0939  AST 17 17  ALT 10 8*  BILITOT 0.3 0.5  PROT 7.6 7.0   Recent Labs    10/10/20 1521 04/10/21 0939  WBC 10.4 9.0  HGB 14.6 14.6  HCT 43.5 43.4  MCV 88.8 89.7  PLT 326 230   Lab Results  Component Value Date   TSH 0.715 07/29/2012   Lab Results  Component Value Date   HGBA1C 5.9 (H) 02/27/2018   Lab Results  Component Value Date   CHOL 242 (H) 04/10/2021   HDL 59 04/10/2021   LDLCALC 158 (H) 04/10/2021   TRIG 125 04/10/2021   CHOLHDL 4.1 04/10/2021    Significant Diagnostic Results in last 30 days:  No results found.  Assessment/Plan  1. Essential hypertension B/p not at goal. Advised to increase hydralazine from 25 mg tablet three times daily to 50 mg tablet three times daily.May use two tablets of current pills until gone.  - continue on lisinopril and metoprolol  - hydrALAZINE (  APRESOLINE) 50 MG tablet; Take 1 tablet (50 mg total) by mouth 3 (three) times daily.  Dispense: 90 tablet; Refill: 0 - lisinopril (ZESTRIL) 5 MG tablet; Take 1 tablet  (5 mg total) by mouth daily.  Dispense: 90 tablet; Refill: 1 - metoprolol succinate (TOPROL-XL) 50 MG 24 hr tablet; Take 1 tablet (50 mg total) by mouth daily.  Dispense: 90 tablet; Refill: 1 - follow up in 2 week to re-evaluate. - dietary modification and exercise advised.  2. Mixed hyperlipidemia LDL not at goal. Has not been taking atorvastatin.I've discussed with him importance of taking cholesterol medication especially being a smoker he is high risk for stroke. Dietary modification and exercise at lease 30 minutes three times per week  also recommended.  - atorvastatin (LIPITOR) 40 MG tablet; Take 1 tablet (40 mg total) by mouth daily.  Dispense: 30 tablet; Refill: 3  3. Tobacco use disorder, continuous Smoking cessation advised but not ready to quit   4. Overweight with body mass index (BMI) 25.0-29.9 BMI 28.5  Dietary modification and exercise discussed   5. Body mass index 28.0-28.9, adult BMI 28.5  Has lost 3 lbs over 6 months  Continue with dietary modification and exercise.    Family/ staff Communication: Reviewed plan of care with patient verbalized understanding.   Labs/tests ordered: Has labs ordered by ID Jamse Mead  Next Appointment : 2 weeks for blood pressure follow up   Sandrea Hughs, NP

## 2021-09-21 ENCOUNTER — Ambulatory Visit: Payer: Self-pay | Admitting: Family

## 2021-10-05 ENCOUNTER — Ambulatory Visit: Payer: Self-pay | Admitting: Family

## 2021-10-21 ENCOUNTER — Other Ambulatory Visit: Payer: Self-pay | Admitting: Family

## 2021-10-21 DIAGNOSIS — E782 Mixed hyperlipidemia: Secondary | ICD-10-CM

## 2021-10-21 DIAGNOSIS — I10 Essential (primary) hypertension: Secondary | ICD-10-CM

## 2021-10-21 DIAGNOSIS — N5201 Erectile dysfunction due to arterial insufficiency: Secondary | ICD-10-CM

## 2021-10-21 MED ORDER — LISINOPRIL 5 MG PO TABS: 5.0000 mg | ORAL_TABLET | Freq: Every day | ORAL | 1 refills | Status: DC

## 2021-10-21 MED ORDER — ATORVASTATIN CALCIUM 40 MG PO TABS: 40.0000 mg | ORAL_TABLET | Freq: Every day | ORAL | 3 refills | Status: DC

## 2021-10-21 MED ORDER — METOPROLOL SUCCINATE ER 50 MG PO TB24: 50.0000 mg | ORAL_TABLET | Freq: Every day | ORAL | 1 refills | Status: DC

## 2021-10-21 MED ORDER — HYDRALAZINE HCL 50 MG PO TABS: 50.0000 mg | ORAL_TABLET | Freq: Three times a day (TID) | ORAL | 0 refills | Status: DC

## 2021-10-21 MED ORDER — SILDENAFIL CITRATE 25 MG PO TABS: 25.0000 mg | ORAL_TABLET | Freq: Every day | ORAL | 3 refills | Status: DC | PRN

## 2021-10-26 ENCOUNTER — Other Ambulatory Visit: Payer: Self-pay | Admitting: *Deleted

## 2021-10-26 DIAGNOSIS — N5201 Erectile dysfunction due to arterial insufficiency: Secondary | ICD-10-CM

## 2021-10-26 MED ORDER — SILDENAFIL CITRATE 25 MG PO TABS: 25.0000 mg | ORAL_TABLET | Freq: Every day | ORAL | 3 refills | Status: DC | PRN

## 2021-10-26 NOTE — Telephone Encounter (Signed)
Patient stated that pharmacy did not receive Rx sent on 10/21/2021. Refaxed.

## 2021-10-29 ENCOUNTER — Ambulatory Visit (INDEPENDENT_AMBULATORY_CARE_PROVIDER_SITE_OTHER): Payer: Self-pay | Admitting: Family

## 2021-10-29 ENCOUNTER — Other Ambulatory Visit: Payer: Self-pay

## 2021-10-29 ENCOUNTER — Encounter: Payer: Self-pay | Admitting: Family

## 2021-10-29 VITALS — BP 170/90 | HR 54 | Temp 97.1°F | Ht 69.0 in | Wt 190.8 lb

## 2021-10-29 DIAGNOSIS — I10 Essential (primary) hypertension: Secondary | ICD-10-CM

## 2021-10-29 DIAGNOSIS — R140 Abdominal distension (gaseous): Secondary | ICD-10-CM

## 2021-10-29 DIAGNOSIS — K219 Gastro-esophageal reflux disease without esophagitis: Secondary | ICD-10-CM

## 2021-10-29 MED ORDER — LISINOPRIL 10 MG PO TABS: 10.0000 mg | ORAL_TABLET | Freq: Every day | ORAL | 3 refills | Status: DC

## 2021-10-29 MED ORDER — FAMOTIDINE 20 MG PO TABS: 20.0000 mg | ORAL_TABLET | Freq: Two times a day (BID) | ORAL | 0 refills | Status: DC

## 2021-10-29 MED ORDER — SIMETHICONE 80 MG PO CHEW: 80.0000 mg | CHEWABLE_TABLET | Freq: Four times a day (QID) | ORAL | 0 refills | Status: DC | PRN

## 2021-10-29 NOTE — Progress Notes (Signed)
Provider: Marlowe Sax FNP-C  Marvel Sapp, Nelda Bucks, NP  Patient Care Team: Ajee Heasley, Nelda Bucks, NP as PCP - General (Family Medicine) Michel Bickers, MD as Consulting Physician (Infectious Diseases)  Extended Emergency Contact Information Primary Emergency Contact: Salifou,Garba Address: [address]          [place], [place] [address] [place] [employer] Phone: [phone] Relation: Brother Secondary Emergency Contact: Kone,Aidriss  [place] [employer] Phone: [phone] Relation: Friend  Code Status:  DNR Goals of care: Advanced Directive information Advanced Directives 09/19/2021  Does Patient Have a Medical Advance Directive? No  Would patient like information on creating a medical advance directive? No - Patient declined     Chief Complaint  Patient presents with   Follow-up    2 week follow up on blood pressure. Blood pressure seems to be a little better. Highest BP in last two weeks has beenin the 180s over 70 area. Patient thinks it's from stress. Patient sometimes get pain in chest when he is walking. Patient states that his gas smells really bad. Patient has been constipated     HPI:  Pt is a 56 y.o. male seen today for an acute visit for B/p at home has been in the 150's /70's -160's /80  States has increased stress level. Declined counselor. Has been taking Hydralazine 50 mg tablet three times daily,Lisinopril 5 mg tablet and metoprolol 50 mg tablet daily. denies any headache,dizziness,vision changes,fatigue,chest tightness,palpitation,chest pain or shortness of breath.    Has acid reflux  and feels bloated passes lots of bad gas.   Past Medical History:  Diagnosis Date   Gout    HIV infection ([facility])    Hyperlipidemia    Hypertension    Substance abuse ([facility])    alcohol    Tobacco abuse    Past Surgical History:  Procedure Laterality Date   WRIST FRACTURE SURGERY  ~ 2009   right; "put a plate in it" (624THL)     Allergies  Allergen Reactions   Genvoya [Elviteg-Cobic-Emtricit-Tenofaf] Rash    Outpatient Encounter Medications as of 10/29/2021  Medication Sig   atorvastatin (LIPITOR) 40 MG tablet Take 1 tablet (40 mg total) by mouth daily.   emtricitabine-rilpivir-tenofovir AF (ODEFSEY) 200-25-25 MG TABS tablet Take 1 tablet by mouth daily.   hydrALAZINE (APRESOLINE) 50 MG tablet Take 1 tablet (50 mg total) by mouth 3 (three) times daily.   lisinopril (ZESTRIL) 5 MG tablet Take 1 tablet (5 mg total) by mouth daily.   metoprolol succinate (TOPROL-XL) 50 MG 24 hr tablet Take 1 tablet (50 mg total) by mouth daily.   sildenafil (VIAGRA) 25 MG tablet Take 1 tablet (25 mg total) by mouth daily as needed for erectile dysfunction.   [DISCONTINUED] aspirin (ASPIRIN 81) 81 MG chewable tablet Chew 1 tablet (81 mg total) by mouth daily.   No facility-administered encounter medications on file as of 10/29/2021.    Review of Systems  Constitutional:  Negative for appetite change, chills, fatigue, fever and unexpected weight change.  Eyes:  Negative for pain, discharge, redness, itching and visual disturbance.  Respiratory:  Negative for cough, chest tightness, shortness of breath and wheezing.   Cardiovascular:  Negative for chest pain, palpitations and leg swelling.  Gastrointestinal:  Negative for abdominal distention, abdominal pain, blood in stool, constipation, diarrhea, nausea and vomiting.  Genitourinary:  Negative for difficulty urinating, dysuria, flank pain, frequency and urgency.  Musculoskeletal:  Negative for arthralgias, back pain, gait problem, joint swelling,  myalgias, neck pain and neck stiffness.  Skin:  Negative for color change, pallor and rash.  Neurological:  Negative for dizziness, syncope, speech difficulty, weakness, light-headedness, numbness and headaches.  Hematological:  Does not bruise/bleed easily.  Psychiatric/Behavioral:  Negative for agitation, behavioral problems,  confusion, hallucinations, self-injury, sleep disturbance and suicidal ideas. The patient is not nervous/anxious.        Reports increase stress level but denies feeling depressed    Immunization History  Administered Date(s) Administered   Influenza,inj,Quad PF,6+ Mos 06/27/2015, 07/29/2016, 07/20/2019   Influenza-Unspecified 08/11/2020   PPD Test 04/25/2015   Pneumococcal Polysaccharide-23 04/25/2015   Pertinent  Health Maintenance Due  Topic Date Due   INFLUENZA VACCINE  01/04/2022 (Originally 05/07/2021)   COLONOSCOPY (Pts 45-43yrs Insurance coverage will need to be confirmed)  03/14/2022 (Originally 01/26/2009)   Fall Risk 10/24/2020 03/21/2021 04/24/2021 09/19/2021 10/29/2021  Falls in the past year? 0 0 0 0 0  Was there an injury with Fall? - 0 - 0 0  Was there an injury with Fall? - - - - -  Fall Risk Category Calculator - 0 - 0 0  Fall Risk Category - Low - Low Low  Patient Fall Risk Level Low fall risk Low fall risk - Low fall risk Low fall risk  Patient at Risk for Falls Due to No Fall Risks - - No Fall Risks No Fall Risks  Fall risk Follow up Falls evaluation completed - - Falls evaluation completed Falls evaluation completed   Functional Status Survey:    Vitals:   10/29/21 1557  BP: (!) 170/90  Pulse: (!) 54  Temp: (!) 97.1 F (36.2 C)  SpO2: 98%  Weight: 190 lb 12.8 oz (86.5 kg)  Height: 5\' 9"  (1.753 m)   Body mass index is 28.18 kg/m. Physical Exam Vitals reviewed.  Constitutional:      General: He is not in acute distress.    Appearance: Normal appearance. He is normal weight. He is not ill-appearing or diaphoretic.  HENT:     Head: Normocephalic.  Eyes:     General: No scleral icterus.       Right eye: No discharge.        Left eye: No discharge.     Extraocular Movements: Extraocular movements intact.     Conjunctiva/sclera: Conjunctivae normal.     Pupils: Pupils are equal, round, and reactive to light.  Neck:     Vascular: No carotid bruit.   Cardiovascular:     Rate and Rhythm: Normal rate and regular rhythm.     Pulses: Normal pulses.     Heart sounds: Normal heart sounds. No murmur heard.   No friction rub. No gallop.  Pulmonary:     Effort: Pulmonary effort is normal. No respiratory distress.     Breath sounds: Normal breath sounds. No wheezing, rhonchi or rales.  Chest:     Chest wall: No tenderness.  Abdominal:     General: Bowel sounds are normal. There is no distension.     Palpations: Abdomen is soft. There is no mass.     Tenderness: There is no abdominal tenderness. There is no right CVA tenderness, left CVA tenderness, guarding or rebound.  Musculoskeletal:        General: No swelling or tenderness. Normal range of motion.     Cervical back: Normal range of motion. No rigidity or tenderness.     Right lower leg: No edema.     Left lower leg: No edema.  Lymphadenopathy:     Cervical: No cervical adenopathy.  Skin:    General: Skin is warm and dry.     Coloration: Skin is not pale.     Findings: No bruising, erythema, lesion or rash.  Neurological:     Mental Status: He is alert and oriented to person, place, and time.     Cranial Nerves: No cranial nerve deficit.     Sensory: No sensory deficit.     Motor: No weakness.     Coordination: Coordination normal.     Gait: Gait normal.  Psychiatric:        Mood and Affect: Mood normal.        Speech: Speech normal.        Behavior: Behavior normal.        Thought Content: Thought content normal.        Judgment: Judgment normal.   Labs reviewed: Recent Labs    04/10/21 0939  NA 137  K 4.0  CL 106  CO2 22  GLUCOSE 110*  BUN 10  CREATININE 1.00  CALCIUM 8.9   Recent Labs    04/10/21 0939  AST 17  ALT 8*  BILITOT 0.5  PROT 7.0   Recent Labs    04/10/21 0939  WBC 9.0  HGB 14.6  HCT 43.4  MCV 89.7  PLT 230   Lab Results  Component Value Date   TSH 0.715 07/29/2012   Lab Results  Component Value Date   HGBA1C 5.9 (H) 02/27/2018    Lab Results  Component Value Date   CHOL 242 (H) 04/10/2021   HDL 59 04/10/2021   LDLCALC 158 (H) 04/10/2021   TRIG 125 04/10/2021   CHOLHDL 4.1 04/10/2021    Significant Diagnostic Results in last 30 days:  No results found.  Assessment/Plan  1. Essential hypertension B/p elevated but did not take his meds this morning will take meds after visit.  Continue on lisinopril,Metoprolol and Hydralazine   - lisinopril (ZESTRIL) 10 MG tablet; Take 1 tablet (10 mg total) by mouth daily.  Dispense: 30 tablet; Refill: 3  2. Gastroesophageal reflux disease without esophagitis H/H stable  Continue famotidine - famotidine (PEPCID) 20 MG tablet; Take 1 tablet (20 mg total) by mouth 2 (two) times daily.  Dispense: 60 tablet; Refill: 0  3. Flatulence/gas pain/belching Start on simethicone - simethicone (GAS-X) 80 MG chewable tablet; Chew 1 tablet (80 mg total) by mouth every 6 (six) hours as needed for flatulence.  Dispense: 30 tablet; Refill: 0  Family/ staff Communication: Reviewed plan of care with patient verbalized understanding   Labs/tests ordered: None   Next Appointment: As needed if symptoms worsen or fail to improve    Sandrea Hughs, NP

## 2022-01-14 ENCOUNTER — Ambulatory Visit: Payer: Self-pay

## 2022-01-14 ENCOUNTER — Other Ambulatory Visit: Payer: Self-pay

## 2022-01-20 ENCOUNTER — Other Ambulatory Visit: Payer: Self-pay | Admitting: Family

## 2022-01-20 DIAGNOSIS — I10 Essential (primary) hypertension: Secondary | ICD-10-CM

## 2022-02-28 ENCOUNTER — Other Ambulatory Visit: Payer: Self-pay | Admitting: *Deleted

## 2022-02-28 DIAGNOSIS — I10 Essential (primary) hypertension: Secondary | ICD-10-CM

## 2022-02-28 DIAGNOSIS — N5201 Erectile dysfunction due to arterial insufficiency: Secondary | ICD-10-CM

## 2022-02-28 MED ORDER — METOPROLOL SUCCINATE ER 50 MG PO TB24: 50.0000 mg | ORAL_TABLET | Freq: Every day | ORAL | 1 refills | Status: DC

## 2022-02-28 MED ORDER — SILDENAFIL CITRATE 25 MG PO TABS: 25.0000 mg | ORAL_TABLET | Freq: Every day | ORAL | 3 refills | Status: DC | PRN

## 2022-02-28 MED ORDER — HYDRALAZINE HCL 50 MG PO TABS: ORAL_TABLET | ORAL | 1 refills | Status: DC

## 2022-02-28 MED ORDER — LISINOPRIL 10 MG PO TABS: 10.0000 mg | ORAL_TABLET | Freq: Every day | ORAL | 1 refills | Status: DC

## 2022-03-05 ENCOUNTER — Ambulatory Visit: Payer: Self-pay | Admitting: Family

## 2022-03-06 ENCOUNTER — Encounter: Payer: Self-pay | Admitting: Family

## 2022-03-06 NOTE — Patient Instructions (Signed)
Please contact your local pharmacy, previous provider, or insurance carrier for vaccine/immunization records. Ensure that any procedures done outside of [facility] are faxed to us [phone] or you can sign release of records form at the front desk to keep your medical record updated.   ?

## 2022-03-10 NOTE — Progress Notes (Signed)
  This encounter was created in error - please disregard. No show 

## 2022-03-11 ENCOUNTER — Encounter (HOSPITAL_BASED_OUTPATIENT_CLINIC_OR_DEPARTMENT_OTHER): Payer: Self-pay | Admitting: Pediatrics

## 2022-03-11 ENCOUNTER — Other Ambulatory Visit: Payer: Self-pay

## 2022-03-11 ENCOUNTER — Emergency Department (HOSPITAL_BASED_OUTPATIENT_CLINIC_OR_DEPARTMENT_OTHER): Payer: No Typology Code available for payment source

## 2022-03-11 ENCOUNTER — Emergency Department (HOSPITAL_BASED_OUTPATIENT_CLINIC_OR_DEPARTMENT_OTHER)
Admission: EM | Admit: 2022-03-11 | Discharge: 2022-03-11 | Disposition: A | Discharge status: Home / Self Care | Payer: No Typology Code available for payment source | Attending: Student | Admitting: Student

## 2022-03-11 DIAGNOSIS — R0789 Other chest pain: Secondary | ICD-10-CM | POA: Insufficient documentation

## 2022-03-11 DIAGNOSIS — I10 Essential (primary) hypertension: Secondary | ICD-10-CM | POA: Insufficient documentation

## 2022-03-11 DIAGNOSIS — Z79899 Other long term (current) drug therapy: Secondary | ICD-10-CM | POA: Diagnosis not present

## 2022-03-11 DIAGNOSIS — R079 Chest pain, unspecified: Secondary | ICD-10-CM | POA: Diagnosis present

## 2022-03-11 DIAGNOSIS — R072 Precordial pain: Secondary | ICD-10-CM

## 2022-03-11 DIAGNOSIS — F17210 Nicotine dependence, cigarettes, uncomplicated: Secondary | ICD-10-CM | POA: Insufficient documentation

## 2022-03-11 DIAGNOSIS — Z21 Asymptomatic human immunodeficiency virus [HIV] infection status: Secondary | ICD-10-CM | POA: Diagnosis not present

## 2022-03-11 LAB — CBC
HCT: 43.2 % (ref 39.0–52.0)
Hemoglobin: 14.7 g/dL (ref 13.0–17.0)
MCH: 29.8 pg (ref 26.0–34.0)
MCHC: 34 g/dL (ref 30.0–36.0)
MCV: 87.6 fL (ref 80.0–100.0)
Platelets: 209 10*3/uL (ref 150–400)
RBC: 4.93 MIL/uL (ref 4.22–5.81)
RDW: 14.2 % (ref 11.5–15.5)
WBC: 5.7 10*3/uL (ref 4.0–10.5)
nRBC: 0 % (ref 0.0–0.2)

## 2022-03-11 LAB — BASIC METABOLIC PANEL
Anion gap: 8 (ref 5–15)
BUN: 7 mg/dL (ref 6–20)
CO2: 24 mmol/L (ref 22–32)
Calcium: 8.8 mg/dL — ABNORMAL LOW (ref 8.9–10.3)
Chloride: 104 mmol/L (ref 98–111)
Creatinine, Ser: 0.93 mg/dL (ref 0.61–1.24)
GFR, Estimated: >60 mL/min (ref >60)
Glucose, Bld: 100 mg/dL — ABNORMAL HIGH (ref 70–99)
Potassium: 4.2 mmol/L (ref 3.5–5.1)
Sodium: 136 mmol/L (ref 135–145)

## 2022-03-11 LAB — TROPONIN I (HIGH SENSITIVITY)
Troponin I (High Sensitivity): 47 ng/L — ABNORMAL HIGH (ref <18)
Troponin I (High Sensitivity): 56 ng/L — ABNORMAL HIGH (ref <18)
Troponin I (High Sensitivity): 52 ng/L — ABNORMAL HIGH (ref <18)

## 2022-03-11 MED ORDER — LIDOCAINE 5 % EX PTCH
1.0000 | MEDICATED_PATCH | CUTANEOUS | Status: DC
  Administered 2022-03-11: 1 via TRANSDERMAL
  Filled 2022-03-11: qty 1
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
## 2022-03-11 NOTE — ED Notes (Signed)
PT TOOK HOME MEDS OF... METOPROLOL ER 50MG  TAB PO LISINOPRIL 10MG  PO HYDRALAZINE 50MG  PO  Pt took meds without orders from provider, ED MD informed
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
## 2022-03-11 NOTE — ED Notes (Signed)
Patient provided with crackers and drink as requested

## 2022-03-11 NOTE — Discharge Instructions (Signed)
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
## 2022-03-21 ENCOUNTER — Encounter: Payer: Self-pay | Admitting: Cardiology

## 2022-03-21 ENCOUNTER — Ambulatory Visit (INDEPENDENT_AMBULATORY_CARE_PROVIDER_SITE_OTHER): Payer: Self-pay | Admitting: Cardiology

## 2022-03-21 VITALS — BP 148/50 | HR 83 | Ht 69.0 in | Wt 190.0 lb

## 2022-03-21 DIAGNOSIS — E7800 Pure hypercholesterolemia, unspecified: Secondary | ICD-10-CM

## 2022-03-21 DIAGNOSIS — Z720 Tobacco use: Secondary | ICD-10-CM

## 2022-03-21 DIAGNOSIS — I10 Essential (primary) hypertension: Secondary | ICD-10-CM

## 2022-03-21 DIAGNOSIS — R011 Cardiac murmur, unspecified: Secondary | ICD-10-CM

## 2022-03-21 DIAGNOSIS — R079 Chest pain, unspecified: Secondary | ICD-10-CM

## 2022-03-21 MED ORDER — ATORVASTATIN CALCIUM 40 MG PO TABS: 40.0000 mg | ORAL_TABLET | Freq: Every day | ORAL | 3 refills | Status: DC

## 2022-03-21 MED ORDER — ASPIRIN 81 MG PO TBEC: 81.0000 mg | DELAYED_RELEASE_TABLET | Freq: Every day | ORAL | 3 refills | Status: DC

## 2022-03-21 NOTE — Patient Instructions (Addendum)
Medication Instructions:   Start taking  Aspirin 81 mg  daily  ( enteric coated)    Start taking Atorvastatin  40 mg daily     May use  Advil or Aleve for knee pain recommend you follow up with primary in regards to knee pain.  See instruction below  *If you need a refill on your cardiac medications before your next appointment, please call your pharmacy*   Lab Work: No labs needed - see instruction below   Fasting in 3 months after starting Atorvastatin 40 mg   Lipid panel  Hepatic  panel  If you have labs (blood work) drawn today and your tests are completely normal, you will receive your results only by: MyChart Message (if you have MyChart) OR A paper copy in the mail If you have any lab test that is abnormal or we need to change your treatment, we will call you to review the results.   Testing/Procedures:   Will be schedule  at [address] Your physician has requested that you have an echocardiogram. Echocardiography is a painless test that uses sound waves to create images of your heart. It provides your doctor with information about the size and shape of your heart and how well your heart's chambers and valves are working. This procedure takes approximately one hour. There are no restrictions for this procedure.  And  Will be schedule at [facility] hospital - radiology dept.  Your physician has requested that you have coronary CTA. Cardiac computed tomography (CT)A is a painless test that uses an x-ray machine to take clear, detailed pictures of your heart. For further information please visit [email or web address] Please follow instruction sheet as given.     Follow-Up: At [facility], you and your health needs are our priority.  As part of our continuing mission to provide you with exceptional heart care, we have created designated Provider Care Teams.  These Care Teams include your primary Cardiologist (physician) and Advanced Practice Providers  (APPs -  Physician Assistants and Nurse Practitioners) who all work together to provide you with the care you need, when you need it.  We recommend signing up for the patient portal called "MyChart".  Sign up information is provided on this After Visit Summary.  MyChart is used to connect with patients for Virtual Visits (Telemedicine).  Patients are able to view lab/test results, encounter notes, upcoming appointments, etc.  Non-urgent messages can be sent to your provider as well.   To learn more about what you can do with MyChart, go to [email or web address].    Your next appointment:   1  to 2 month(s)  The format for your next appointment:   In Person  Provider:   Peter Swaz[place], MD     Other Instructions   Your cardiac CT will be scheduled at the below location:   [facility] [address] [place], [place] [address] [phone]    If scheduled at [facility], please arrive at the [facility] and Children's Entrance (Entrance C2) of [facility] 30 minutes prior to test start time. You can use the FREE valet parking offered at entrance C (encouraged to control the heart rate for the test)  Proceed to the [facility] Radiology Department (first floor) to check-in and test prep.  All radiology patients and guests should use entrance C2 at [facility], accessed from [facility], even though the hospital's physical address listed is [address].  Please follow these instructions carefully (unless otherwise directed):  Hold all erectile dysfunction medications  ( Viagra ) at least 3 days (72 hrs) prior to test.  On the Night Before the Test: Be sure to Drink plenty of water. Do not consume any caffeinated/decaffeinated beverages or chocolate 12 hours prior to your test. Do not take any antihistamines 12 hours prior to your test.   On the Day of the Test: Drink plenty of water until 1 hour prior to the test. Do  not eat any food 4 hours prior to the test. You may take your regular medications prior to the test.  Take metoprolol (Toprol XL ) 100 mg ( 2 tablets of 50 mg )  two hours prior to test. HOLD Furosemide/Hydrochlorothiazide morning of the test.            After the Test: Drink plenty of water. After receiving IV contrast, you may experience a mild flushed feeling. This is normal. On occasion, you may experience a mild rash up to 24 hours after the test. This is not dangerous. If this occurs, you can take Benadryl 25 mg and increase your fluid intake. If you experience trouble breathing, this can be serious. If it is severe call 911 IMMEDIATELY. If it is mild, please call our office. If you take any of these medications: Glipizide/Metformin, Avandament, Glucavance, please do not take 48 hours after completing test unless otherwise instructed.  We will call to schedule your test 2-4 weeks out understanding that some insurance companies will need an authorization prior to the service being performed.   For non-scheduling related questions, please contact the cardiac imaging nurse navigator should you have any questions/concerns: Rockwell Alexandria, Cardiac Imaging Nurse Navigator Larey Brick, Cardiac Imaging Nurse Navigator [facility] Direct Office Dial: [phone]   For scheduling needs, including cancellations and rescheduling, please call [place], [phone].   Important Information About Sugar

## 2022-04-03 ENCOUNTER — Ambulatory Visit (HOSPITAL_COMMUNITY): Payer: Self-pay | Attending: Cardiology

## 2022-04-03 ENCOUNTER — Telehealth (HOSPITAL_COMMUNITY): Payer: Self-pay | Admitting: Emergency Medicine

## 2022-04-03 ENCOUNTER — Encounter (HOSPITAL_COMMUNITY): Payer: Self-pay

## 2022-04-03 NOTE — Telephone Encounter (Signed)
Attempted to call patient regarding upcoming cardiac CT appointment. °Left message on voicemail with name and callback number °Sabas Frett RN Navigator Cardiac Imaging °[facility] °[phone] Office °[phone] Cell ° °

## 2022-04-03 NOTE — Progress Notes (Signed)
Verified appointment "no show" status with A. Bennett at 16:05.

## 2022-04-04 ENCOUNTER — Encounter (HOSPITAL_COMMUNITY): Payer: Self-pay | Admitting: Cardiology

## 2022-04-04 ENCOUNTER — Ambulatory Visit: Admission: RE | Admit: 2022-04-04 | Payer: Self-pay | Source: Ambulatory Visit

## 2022-04-21 ENCOUNTER — Emergency Department (HOSPITAL_BASED_OUTPATIENT_CLINIC_OR_DEPARTMENT_OTHER)
Admission: EM | Admit: 2022-04-21 | Discharge: 2022-04-21 | Disposition: A | Discharge status: Home / Self Care | Payer: Self-pay | Attending: Emergency Medicine | Admitting: Emergency Medicine

## 2022-04-21 ENCOUNTER — Emergency Department (HOSPITAL_BASED_OUTPATIENT_CLINIC_OR_DEPARTMENT_OTHER): Payer: Self-pay

## 2022-04-21 ENCOUNTER — Encounter (HOSPITAL_BASED_OUTPATIENT_CLINIC_OR_DEPARTMENT_OTHER): Payer: Self-pay | Admitting: Emergency Medicine

## 2022-04-21 ENCOUNTER — Emergency Department (HOSPITAL_COMMUNITY)
Admission: EM | Admit: 2022-04-21 | Discharge: 2022-04-21 | Disposition: A | Discharge status: Home / Self Care | Payer: Self-pay | Source: Home / Self Care

## 2022-04-21 ENCOUNTER — Other Ambulatory Visit: Payer: Self-pay

## 2022-04-21 DIAGNOSIS — E871 Hypo-osmolality and hyponatremia: Secondary | ICD-10-CM | POA: Insufficient documentation

## 2022-04-21 DIAGNOSIS — Z79899 Other long term (current) drug therapy: Secondary | ICD-10-CM | POA: Insufficient documentation

## 2022-04-21 DIAGNOSIS — F17200 Nicotine dependence, unspecified, uncomplicated: Secondary | ICD-10-CM | POA: Insufficient documentation

## 2022-04-21 DIAGNOSIS — Z21 Asymptomatic human immunodeficiency virus [HIV] infection status: Secondary | ICD-10-CM | POA: Insufficient documentation

## 2022-04-21 DIAGNOSIS — R200 Anesthesia of skin: Secondary | ICD-10-CM | POA: Insufficient documentation

## 2022-04-21 DIAGNOSIS — I10 Essential (primary) hypertension: Secondary | ICD-10-CM | POA: Insufficient documentation

## 2022-04-21 DIAGNOSIS — Z7982 Long term (current) use of aspirin: Secondary | ICD-10-CM | POA: Insufficient documentation

## 2022-04-21 LAB — CBC
HCT: 41.8 % (ref 39.0–52.0)
Hemoglobin: 14 g/dL (ref 13.0–17.0)
MCH: 29.6 pg (ref 26.0–34.0)
MCHC: 33.5 g/dL (ref 30.0–36.0)
MCV: 88.4 fL (ref 80.0–100.0)
Platelets: 264 10*3/uL (ref 150–400)
RBC: 4.73 MIL/uL (ref 4.22–5.81)
RDW: 14 % (ref 11.5–15.5)
WBC: 6 10*3/uL (ref 4.0–10.5)
nRBC: 0 % (ref 0.0–0.2)

## 2022-04-21 LAB — BASIC METABOLIC PANEL
Anion gap: 9 (ref 5–15)
BUN: 11 mg/dL (ref 6–20)
CO2: 23 mmol/L (ref 22–32)
Calcium: 8.5 mg/dL — ABNORMAL LOW (ref 8.9–10.3)
Chloride: 101 mmol/L (ref 98–111)
Creatinine, Ser: 0.81 mg/dL (ref 0.61–1.24)
GFR, Estimated: >60 mL/min (ref >60)
Glucose, Bld: 160 mg/dL — ABNORMAL HIGH (ref 70–99)
Potassium: 3.9 mmol/L (ref 3.5–5.1)
Sodium: 133 mmol/L — ABNORMAL LOW (ref 135–145)

## 2022-04-21 NOTE — ED Notes (Signed)
Patient states that the numbness started around 10am this am. Denies any pain. Patient able to move all extremities.

## 2022-04-21 NOTE — ED Notes (Signed)
Patient states that he cannt void

## 2022-04-21 NOTE — ED Provider Notes (Signed)
[facility] EMERGENCY DEPARTMENT Provider Note   CSN: [id number] Arrival date & time: 04/21/22  1735     History  Chief Complaint  Patient presents with   Numbness    Brandon Quinn is a 56 y.o. male.  Patient went to bed about 4 in the morning.  Stated he was drinking heavily last night woke up around 10 this morning and noticed numbness just to the right great toe and to the back of his right hand and fingers but everything worked well.  No speech problems no visual problems no numbness anywhere else no weakness.  All symptoms have resolved.  Resolved after he got here.  Patient arrived here at around 1800.  Also no headache.  Past medical history significant for hypertension HIV infection hyperlipidemia history of alcohol abuse every day smoker.       Home Medications Prior to Admission medications   Medication Sig Start Date End Date Taking? Authorizing Provider  aspirin EC 81 MG tablet Take 1 tablet (81 mg total) by mouth daily. Swallow whole. 03/21/22   [place], Peter M, MD  atorvastatin (LIPITOR) 40 MG tablet Take 1 tablet (40 mg total) by mouth daily. 03/21/22 03/16/23  [place], Peter M, MD  emtricitabine-rilpivir-tenofovir AF (ODEFSEY) 200-25-25 MG TABS tablet Take 1 tablet by mouth daily. 04/24/21   Cliffton Asters, MD  hydrALAZINE (APRESOLINE) 50 MG tablet TAKE 1 TABLET(50 MG) BY MOUTH THREE TIMES DAILY 02/28/22   Ngetich, Dinah C, NP  lisinopril (ZESTRIL) 10 MG tablet Take 1 tablet (10 mg total) by mouth daily. 02/28/22 08/27/22  Ngetich, Dinah C, NP  metoprolol succinate (TOPROL-XL) 50 MG 24 hr tablet Take 1 tablet (50 mg total) by mouth daily. 02/28/22   Ngetich, Dinah C, NP  sildenafil (VIAGRA) 25 MG tablet Take 1 tablet (25 mg total) by mouth daily as needed for erectile dysfunction. 02/28/22   Ngetich, Dinah C, NP  simethicone (GAS-X) 80 MG chewable tablet Chew 1 tablet (80 mg total) by mouth every 6 (six) hours as needed for flatulence. 10/29/21   Ngetich, Donalee Citrin, NP       Allergies    Patient has no known allergies.    Review of Systems   Review of Systems  Constitutional:  Negative for chills and fever.  HENT:  Negative for ear pain and sore throat.   Eyes:  Negative for pain and visual disturbance.  Respiratory:  Negative for cough and shortness of breath.   Cardiovascular:  Negative for chest pain and palpitations.  Gastrointestinal:  Negative for abdominal pain and vomiting.  Genitourinary:  Negative for dysuria and hematuria.  Musculoskeletal:  Negative for arthralgias and back pain.  Skin:  Negative for color change and rash.  Neurological:  Positive for numbness. Negative for seizures and syncope.  All other systems reviewed and are negative.   Physical Exam Updated Vital Signs BP (!) 146/52   Pulse 67   Temp 98.5 F (36.9 C) (Oral)   Resp (!) 23   Ht 1.753 m (5\' 9" )   Wt 86.2 kg   SpO2 97%   BMI 28.06 kg/m  Physical Exam Vitals and nursing note reviewed.  Constitutional:      General: He is not in acute distress.    Appearance: Normal appearance. He is well-developed.  HENT:     Head: Normocephalic and atraumatic.  Eyes:     Extraocular Movements: Extraocular movements intact.     Conjunctiva/sclera: Conjunctivae normal.     Pupils: Pupils are equal,  round, and reactive to light.  Cardiovascular:     Rate and Rhythm: Normal rate and regular rhythm.     Heart sounds: No murmur heard. Pulmonary:     Effort: Pulmonary effort is normal. No respiratory distress.     Breath sounds: Normal breath sounds.  Abdominal:     Palpations: Abdomen is soft.     Tenderness: There is no abdominal tenderness.  Musculoskeletal:        General: No swelling, tenderness, deformity or signs of injury.     Cervical back: Neck supple.     Comments: Radial pulses 2+ bilaterally dorsalis pedis pulse is 2+ bilaterally  Skin:    General: Skin is warm and dry.     Capillary Refill: Capillary refill takes less than 2 seconds.  Neurological:      General: No focal deficit present.     Mental Status: He is alert and oriented to person, place, and time.     Cranial Nerves: No cranial nerve deficit.     Sensory: No sensory deficit.     Motor: No weakness.     Coordination: Coordination normal.     Comments: Patient without any neurodeficit at all.  Psychiatric:        Mood and Affect: Mood normal.     ED Results / Procedures / Treatments   Labs (all labs ordered are listed, but only abnormal results are displayed) Labs Reviewed  BASIC METABOLIC PANEL - Abnormal; Notable for the following components:      Result Value   Sodium 133 (*)    Glucose, Bld 160 (*)    Calcium 8.5 (*)    All other components within normal limits  CBC  URINALYSIS, ROUTINE W REFLEX MICROSCOPIC  CBG MONITORING, ED    EKG  Radiology CT Head Wo Contrast  Result Date: 04/21/2022 CLINICAL DATA:  Facial paralysis/weakness (CN 7) Dizziness, non-specific EXAM: CT HEAD WITHOUT CONTRAST TECHNIQUE: Contiguous axial images were obtained from the base of the skull through the vertex without intravenous contrast. RADIATION DOSE REDUCTION: This exam was performed according to the departmental dose-optimization program which includes automated exposure control, adjustment of the mA and/or kV according to patient size and/or use of iterative reconstruction technique. COMPARISON:  Head CT 08/08/2020, brain MRI 08/09/2020 FINDINGS: Brain: No evidence of acute infarction, hemorrhage, hydrocephalus, extra-axial collection or mass lesion/mass effect. 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 Remote lacunar infarct in the left thalamus again seen. Vascular: No hyperdense vessel or unexpected calcification. Skull: No fracture or focal lesion. Sinuses/Orbits: Stable sclerotic focus within the right frontal sinus likely near the Oma. Scattered mucosal thickening throughout the paranasal sinuses. No sinus fluid level. No mastoid  effusion. Other: None. IMPRESSION: 1. No acute intracranial abnormality. 2. 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 Remote lacunar infarct in the left thalamus. Electronically Signed   By: Narda Rutherford M.D.   On: 04/21/2022 18:49    Procedures Procedures    Medications Ordered in ED Medications - No data to display  ED Course/ Medical Decision Making/ A&P                           Medical Decision Making Amount and/or Complexity of Data Reviewed Labs: ordered.   Patient symptoms have now completely resolved exam is completely normal.  The numbness seems a little peculiar and that it was isolated just to  his right great toe and in the back of his right hand and some of his right fingers.  Patient had no speech problems no headache no numbness or weakness anywhere else.  No visual problems.  I think the pattern of this seems unlikely patient did admit he was drinking heavily during the night.  Patient does take an aspirin a day.  Work-up here sodium a little low at 133 glucose 160 calcium slightly low at 8.5.  White blood cell count 6.0 hemoglobin is 14 CT head negative except for it does show remote lacunar infarct in the left thalamus area.  I think that the isolated pattern makes CVA highly unlikely.  His blood pressure is good here is not significantly elevated.  We will have patient follow-up with primary care provider.  Patient will return for any new or worse symptoms at all.  Work note provided. Final Clinical Impression(s) / ED Diagnoses Final diagnoses:  Numbness    Rx / DC Orders ED Discharge Orders     None         Vanetta Mulders, MD 04/21/22 2004

## 2022-04-21 NOTE — Discharge Instructions (Signed)
Return for any new or worse symptoms.  Feel symptoms may have been due to sleeping heavy after the intoxication.  Work-up here today without any acute findings.  Doubt based on the location that it would be related to a stroke.  Make an appointment follow-up with your doctor.  Return for any new or worse symptoms.

## 2022-04-21 NOTE — ED Triage Notes (Signed)
Pt arrives pov, steady gait, c/o right side numbness that started upon waking at 1000 today. Pt last normal at 0400 today. Pt AOx4. Pt endorses ETOH last night

## 2022-04-27 ENCOUNTER — Other Ambulatory Visit: Payer: Self-pay | Admitting: Internal Medicine

## 2022-04-27 DIAGNOSIS — B20 Human immunodeficiency virus [HIV] disease: Secondary | ICD-10-CM

## 2022-04-29 NOTE — Telephone Encounter (Signed)
Appt 7/26 

## 2022-05-01 ENCOUNTER — Encounter: Payer: Self-pay | Admitting: Internal Medicine

## 2022-05-01 ENCOUNTER — Other Ambulatory Visit: Payer: Self-pay

## 2022-05-01 ENCOUNTER — Ambulatory Visit (INDEPENDENT_AMBULATORY_CARE_PROVIDER_SITE_OTHER): Payer: Self-pay | Admitting: Internal Medicine

## 2022-05-01 ENCOUNTER — Ambulatory Visit: Payer: Self-pay

## 2022-05-01 DIAGNOSIS — B20 Human immunodeficiency virus [HIV] disease: Secondary | ICD-10-CM

## 2022-05-01 DIAGNOSIS — F1010 Alcohol abuse, uncomplicated: Secondary | ICD-10-CM

## 2022-05-01 DIAGNOSIS — K130 Diseases of lips: Secondary | ICD-10-CM

## 2022-05-01 DIAGNOSIS — Z720 Tobacco use: Secondary | ICD-10-CM

## 2022-05-01 MED ORDER — ODEFSEY 200-25-25 MG PO TABS: 1.0000 | ORAL_TABLET | Freq: Every day | ORAL | 11 refills | Status: DC

## 2022-05-01 NOTE — Assessment & Plan Note (Signed)
His infection has been under excellent, long-term control.  He will get lab work today, continue Odefsey and follow-up in 1 year.

## 2022-05-01 NOTE — Assessment & Plan Note (Signed)
Dental referral placed today for [facility]. Information to schedule appointment completed today.   

## 2022-05-01 NOTE — Assessment & Plan Note (Signed)
I talked to him about formulating a plan to quit smoking including setting a quit date within the next 6 weeks.

## 2022-05-01 NOTE — Progress Notes (Signed)
Patient Active Problem List   Diagnosis Date Noted   HIV disease (HCC) 05/18/2015    Priority: High   Body mass index 28.0-28.9, adult 04/01/2021   Lip lesion 04/13/2020   Poor dentition 04/13/2020   Rectal pain 04/13/2020   Chronic bronchitis (HCC) 01/03/2020   Alcohol abuse 06/01/2017   Pain, dental 07/29/2016   Acute gouty arthritis 01/30/2015   Tobacco abuse 07/30/2012   Hypertension 07/29/2012    Patient's Medications  New Prescriptions   No medications on file  Previous Medications   ASPIRIN EC 81 MG TABLET    Take 1 tablet (81 mg total) by mouth daily. Swallow whole.   ATORVASTATIN (LIPITOR) 40 MG TABLET    Take 1 tablet (40 mg total) by mouth daily.   HYDRALAZINE (APRESOLINE) 50 MG TABLET    TAKE 1 TABLET(50 MG) BY MOUTH THREE TIMES DAILY   LISINOPRIL (ZESTRIL) 10 MG TABLET    Take 1 tablet (10 mg total) by mouth daily.   METOPROLOL SUCCINATE (TOPROL-XL) 50 MG 24 HR TABLET    Take 1 tablet (50 mg total) by mouth daily.   SILDENAFIL (VIAGRA) 25 MG TABLET    Take 1 tablet (25 mg total) by mouth daily as needed for erectile dysfunction.   SIMETHICONE (GAS-X) 80 MG CHEWABLE TABLET    Chew 1 tablet (80 mg total) by mouth every 6 (six) hours as needed for flatulence.  Modified Medications   Modified Medication Previous Medication   EMTRICITABINE-RILPIVIR-TENOFOVIR AF (ODEFSEY) 200-25-25 MG TABS TABLET emtricitabine-rilpivir-tenofovir AF (ODEFSEY) 200-25-25 MG TABS tablet      Take 1 tablet by mouth daily.    Take 1 tablet by mouth daily.  Discontinued Medications   No medications on file    Subjective: Brandon Quinn is in for his routine HIV follow-up visit.  He has not had any problems obtaining, taking or tolerating his Odefsey.  He tells me that he has not been missing any doses.  He continues to smoke cigarettes but has a desire to quit.  A pack lasts him about 3 days.  He did quit for 6 months about 10 years ago when he was put in detention by [employer].  He says that he does not inhale the cigarette smoke.  He also admits that he is concerned about his alcohol intake.  He says that he drinks 3-4 beers daily.  He was in the emergency department recently after a night of heavy drinking.  He says that he is worried about the lesion on his lower lip that comes and goes.  It is not painful.  He is also noted some discoloration and slight discomfort on the roof of his mouth over the past several months.  I made a dental referral when he was here last year but he says that no one ever called him to make an appointment.  He says that he is walking for exercise about 20 minutes, 1 or 2 times daily.  Review of Systems: Review of Systems  Constitutional:  Negative for fever and weight loss.  Respiratory:  Negative for cough.   Cardiovascular:  Negative for chest pain.  Psychiatric/Behavioral:  Negative for depression.     Past Medical History:  Diagnosis Date   Gout    HIV infection (HCC)    Hyperlipidemia    Hypertension    Substance abuse (HCC)    alcohol    Tobacco abuse     Social History  Tobacco Use   Smoking status: Every Day    Packs/day: 0.50    Years: 26.00    Total pack years: 13.00    Types: Cigarettes   Smokeless tobacco: Never  Vaping Use   Vaping Use: Never used  Substance Use Topics   Alcohol use: Yes    Comment: couple beers a day   Drug use: Never    Family History  Problem Relation Age of Onset   Hypertension Sister    Hypertension Brother    Other Other        No known heart disease    No Known Allergies  Health Maintenance  Topic Date Due   COVID-19 Vaccine (1) Never done   Zoster Vaccines- Shingrix (1 of 2) Never done   COLONOSCOPY (Pts 45-26yrs Insurance coverage will need to be confirmed)  Never done   TETANUS/TDAP  09/19/2022 (Originally 01/27/1985)   INFLUENZA VACCINE  05/07/2022   Hepatitis C Screening  Completed   HIV Screening  Completed   HPV VACCINES  Aged Out     Objective:  Vitals:   05/01/22 1038  BP: (!) 160/67  Pulse: 78  Temp: 98.3 F (36.8 C)  TempSrc: Oral  SpO2: 97%  Weight: 191 lb (86.6 kg)   Body mass index is 28.21 kg/m.  Physical Exam Constitutional:      Comments: His spirits are good.  HENT:     Mouth/Throat:     Mouth: Mucous membranes are moist.     Pharynx: Oropharynx is clear. No oropharyngeal exudate.     Comments: He has a small, papular lesion on his left lower lip.  There are some hyperpigmented lesions on his hard palate otherwise no abnormalities are seen. Cardiovascular:     Rate and Rhythm: Normal rate and regular rhythm.     Heart sounds: No murmur heard. Pulmonary:     Effort: Pulmonary effort is normal.     Breath sounds: Normal breath sounds.  Psychiatric:        Mood and Affect: Mood normal.     Lab Results Lab Results  Component Value Date   WBC 6.0 04/21/2022   HGB 14.0 04/21/2022   HCT 41.8 04/21/2022   MCV 88.4 04/21/2022   PLT 264 04/21/2022    Lab Results  Component Value Date   CREATININE 0.81 04/21/2022   BUN 11 04/21/2022   NA 133 (L) 04/21/2022   K 3.9 04/21/2022   CL 101 04/21/2022   CO2 23 04/21/2022    Lab Results  Component Value Date   ALT 8 (L) 04/10/2021   AST 17 04/10/2021   ALKPHOS 77 01/04/2020   BILITOT 0.5 04/10/2021    Lab Results  Component Value Date   CHOL 242 (H) 04/10/2021   HDL 59 04/10/2021   LDLCALC 158 (H) 04/10/2021   TRIG 125 04/10/2021   CHOLHDL 4.1 04/10/2021   Lab Results  Component Value Date   LABRPR NON-REACTIVE 04/10/2021   HIV 1 RNA Quant  Date Value  04/10/2021 Not Detected Copies/mL  10/10/2020 <20 Copies/mL  03/21/2020 <20 NOT DETECTED copies/mL   CD4 T Cell Abs (/uL)  Date Value  04/10/2021 552  03/21/2020 450  07/20/2019 461     Problem List Items Addressed This Visit       High   HIV disease (HCC)    His infection has been under excellent, long-term control.  He will get lab work today, continue Odefsey  and follow-up in 1 year.  Relevant Medications   emtricitabine-rilpivir-tenofovir AF (ODEFSEY) 200-25-25 MG TABS tablet   Other Relevant Orders   T-helper cells (CD4) count (not at [facility])   HIV-1 RNA quant-no reflex-bld   CBC   Comprehensive metabolic panel   RPR   Lipid panel     Unprioritized   Tobacco abuse    I talked to him about formulating a plan to quit smoking including setting a quit date within the next 6 weeks.      Alcohol abuse    He has some insight into the dangers again.  I have asked him to consider the importance of cutting down.      Lip lesion    Dental referral placed today for [facility]. Information to schedule appointment completed today.           Cliffton Asters, MD [facility] [phone] pager   [phone] cell 05/01/2022, 11:11 AM

## 2022-05-01 NOTE — Assessment & Plan Note (Signed)
He has some insight into the dangers again.  I have asked him to consider the importance of cutting down.

## 2022-05-03 LAB — T-HELPER CELLS (CD4) COUNT (NOT AT ARMC)
CD4 % Helper T Cell: 19 % — ABNORMAL LOW (ref 33–65)
CD4 T Cell Abs: 481 /uL (ref 400–1790)

## 2022-05-04 LAB — LIPID PANEL
Cholesterol: 152 mg/dL (ref <200)
HDL: 66 mg/dL (ref >=40)
LDL Cholesterol (Calc): 72 mg/dL (calc)
Non-HDL Cholesterol (Calc): 86 mg/dL (calc) (ref <130)
Total CHOL/HDL Ratio: 2.3 (calc) (ref <5.0)
Triglycerides: 64 mg/dL (ref <150)

## 2022-05-04 LAB — CBC
HCT: 39.7 % (ref 38.5–50.0)
Hemoglobin: 13.2 g/dL (ref 13.2–17.1)
MCH: 30.2 pg (ref 27.0–33.0)
MCHC: 33.2 g/dL (ref 32.0–36.0)
MCV: 90.8 fL (ref 80.0–100.0)
MPV: 10.5 fL (ref 7.5–12.5)
Platelets: 230 10*3/uL (ref 140–400)
RBC: 4.37 10*6/uL (ref 4.20–5.80)
RDW: 13.3 % (ref 11.0–15.0)
WBC: 6.7 10*3/uL (ref 3.8–10.8)

## 2022-05-04 LAB — COMPREHENSIVE METABOLIC PANEL
AG Ratio: 1.5 (calc) (ref 1.0–2.5)
ALT: 11 U/L (ref 9–46)
AST: 18 U/L (ref 10–35)
Albumin: 4.3 g/dL (ref 3.6–5.1)
Alkaline phosphatase (APISO): 77 U/L (ref 35–144)
BUN: 9 mg/dL (ref 7–25)
CO2: 27 mmol/L (ref 20–32)
Calcium: 9.2 mg/dL (ref 8.6–10.3)
Chloride: 106 mmol/L (ref 98–110)
Creat: 0.87 mg/dL (ref 0.70–1.30)
Globulin: 2.9 g/dL (calc) (ref 1.9–3.7)
Glucose, Bld: 76 mg/dL (ref 65–99)
Potassium: 3.9 mmol/L (ref 3.5–5.3)
Sodium: 139 mmol/L (ref 135–146)
Total Bilirubin: 0.4 mg/dL (ref 0.2–1.2)
Total Protein: 7.2 g/dL (ref 6.1–8.1)

## 2022-05-04 LAB — HIV-1 RNA QUANT-NO REFLEX-BLD
HIV 1 RNA Quant: NOT DETECTED Copies/mL
HIV-1 RNA Quant, Log: NOT DETECTED Log cps/mL

## 2022-05-04 LAB — RPR: RPR Ser Ql: NONREACTIVE
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
## 2022-05-23 ENCOUNTER — Ambulatory Visit: Payer: Self-pay | Admitting: Cardiology

## 2022-09-21 ENCOUNTER — Other Ambulatory Visit: Payer: Self-pay | Admitting: Family

## 2022-09-21 DIAGNOSIS — I10 Essential (primary) hypertension: Secondary | ICD-10-CM

## 2022-10-25 ENCOUNTER — Encounter: Payer: Self-pay | Admitting: Family

## 2022-10-25 ENCOUNTER — Ambulatory Visit (INDEPENDENT_AMBULATORY_CARE_PROVIDER_SITE_OTHER): Payer: No Typology Code available for payment source | Admitting: Family

## 2022-10-25 VITALS — BP 140/62 | HR 91 | Temp 96.5°F | Resp 18 | Ht 69.0 in | Wt 187.4 lb

## 2022-10-25 DIAGNOSIS — I10 Essential (primary) hypertension: Secondary | ICD-10-CM

## 2022-10-25 DIAGNOSIS — F1010 Alcohol abuse, uncomplicated: Secondary | ICD-10-CM

## 2022-10-25 DIAGNOSIS — N5201 Erectile dysfunction due to arterial insufficiency: Secondary | ICD-10-CM | POA: Diagnosis not present

## 2022-10-25 DIAGNOSIS — E782 Mixed hyperlipidemia: Secondary | ICD-10-CM

## 2022-10-25 DIAGNOSIS — F17209 Nicotine dependence, unspecified, with unspecified nicotine-induced disorders: Secondary | ICD-10-CM

## 2022-10-25 MED ORDER — LISINOPRIL 10 MG PO TABS
10.0000 mg | ORAL_TABLET | Freq: Every day | ORAL | 1 refills | Status: DC
Start: 2022-10-25 — End: 2022-12-19

## 2022-10-25 MED ORDER — METOPROLOL SUCCINATE ER 25 MG PO TB24: 25.0000 mg | ORAL_TABLET | Freq: Every day | ORAL | 1 refills | Status: DC

## 2022-10-25 MED ORDER — HYDRALAZINE HCL 25 MG PO TABS: 25.0000 mg | ORAL_TABLET | Freq: Every day | ORAL | 1 refills | Status: DC

## 2022-10-25 MED ORDER — SILDENAFIL CITRATE 25 MG PO TABS: 25.0000 mg | ORAL_TABLET | Freq: Every day | ORAL | 5 refills | Status: DC | PRN

## 2022-10-25 NOTE — Progress Notes (Signed)
Provider: Marlowe Sax FNP-C   Anesha Hackert, Nelda Bucks, NP  Patient Care Team: Bevan Vu, Nelda Bucks, NP as PCP - General (Family Medicine) Martinique, Peter M, MD as PCP - Cardiology (Cardiology) Michel Bickers, MD as Consulting Physician (Infectious Diseases)  Extended Emergency Contact Information Primary Emergency Contact: Salifou,Garba Address: [address]          [place], [place] [address] [place] Phone: [phone] Relation: Brother Secondary Emergency Contact: Kone,Aidriss  [place] [employer] Phone: [phone] Relation: Friend  Code Status:  Full Code  Goals of care: Advanced Directive information    10/25/2022    2:31 PM  Advanced Directives  Does Patient Have a Medical Advance Directive? No  Would patient like information on creating a medical advance directive? No - Patient declined     Chief Complaint  Patient presents with   Medications     Patient is here for medication concerns. Patient complains that blood pressure medications are not helping. Patient states when he takes medications for blood pressure he receives chest pain. Without taking medications he doesn't have chest pain.     HPI:  Pt is a 57 y.o. male seen today for medical management of chronic diseases.  He complains of palate being dark in  color.Also lips has small swelling.states a friend told him dark color on the palate and lips is from cigarette smoking.   Has not been taking his cholesterol medication. He states has cut down on his blood pressure medication hydralazine and Metoprolol down to 25 mg tablet instead of 50 mg tablet.states whenever he takes medication he has chest pain.No home blood pressure readings for review.denies any headache,dizziness,vision changes,fatigue,chest tightness,palpitation,chest pain or shortness of breath today.     Tells me he has stopped smoking  and drinking alcohol from today.   Past Medical History:  Diagnosis Date    Gout    HIV infection ([place])    Hyperlipidemia    Hypertension    Substance abuse ([place])    alcohol    Tobacco abuse    Past Surgical History:  Procedure Laterality Date   WRIST FRACTURE SURGERY  ~ 2009   right; "put a plate in it" (40/81/4481)    No Known Allergies  Allergies as of 10/25/2022   No Known Allergies      Medication List        Accurate as of October 25, 2022  2:36 PM. If you have any questions, ask your nurse or doctor.          aspirin EC 81 MG tablet Take 1 tablet (81 mg total) by mouth daily. Swallow whole.   atorvastatin 40 MG tablet Commonly known as: LIPITOR Take 1 tablet (40 mg total) by mouth daily.   hydrALAZINE 50 MG tablet Commonly known as: APRESOLINE TAKE 1 TABLET(50 MG) BY MOUTH THREE TIMES DAILY   lisinopril 10 MG tablet Commonly known as: ZESTRIL Take 1 tablet (10 mg total) by mouth daily.   metoprolol succinate 50 MG 24 hr tablet Commonly known as: TOPROL-XL TAKE 1 TABLET(50 MG) BY MOUTH DAILY   Odefsey 200-25-25 MG Tabs tablet Generic drug: emtricitabine-rilpivir-tenofovir AF Take 1 tablet by mouth daily.   sildenafil 25 MG tablet Commonly known as: Viagra Take 1 tablet (25 mg total) by mouth daily as needed for erectile dysfunction.   simethicone 80 MG chewable tablet Commonly known as: Gas-X Chew 1 tablet (80 mg total) by mouth every 6 (six) hours as needed  for flatulence.        Review of Systems  Constitutional:  Negative for appetite change, chills, fatigue, fever and unexpected weight change.  HENT:  Negative for congestion, dental problem, ear discharge, ear pain, hearing loss, nosebleeds, postnasal drip, rhinorrhea, sinus pressure, sinus pain, sneezing, sore throat, tinnitus and trouble swallowing.   Eyes:  Negative for pain, discharge, redness, itching and visual disturbance.  Respiratory:  Negative for cough, chest tightness, shortness of breath and wheezing.   Cardiovascular:  Negative for chest pain,  palpitations and leg swelling.  Gastrointestinal:  Negative for abdominal distention, abdominal pain, constipation, diarrhea, nausea and vomiting.  Genitourinary:  Negative for urgency.  Skin:  Negative for color change, pallor, rash and wound.  Neurological:  Negative for dizziness, speech difficulty, light-headedness, numbness and headaches.    Immunization History  Administered Date(s) Administered   Influenza,inj,Quad PF,6+ Mos 06/27/2015, 07/29/2016, 07/20/2019   Influenza-Unspecified 06/27/2015, 07/29/2016, 08/11/2020   PPD Test 04/25/2015   Pneumococcal Polysaccharide-23 04/25/2015   Pertinent  Health Maintenance Due  Topic Date Due   COLONOSCOPY (Pts 45-53yrs Insurance coverage will need to be confirmed)  Never done   INFLUENZA VACCINE  05/07/2022      09/19/2021    8:29 AM 10/29/2021    3:56 PM 03/11/2022   11:42 AM 04/21/2022    6:01 PM 10/25/2022    2:31 PM  Fall Risk  Falls in the past year? 0 0   0  Was there an injury with Fall? 0 0   0  Fall Risk Category Calculator 0 0   0  Fall Risk Category (Retired) Low Low     (RETIRED) Patient Fall Risk Level Low fall risk Low fall risk Moderate fall risk Low fall risk   Patient at Risk for Falls Due to No Fall Risks No Fall Risks   No Fall Risks  Fall risk Follow up Falls evaluation completed Falls evaluation completed   Falls evaluation completed   Functional Status Survey:    Vitals:   10/25/22 1430  BP: (!) 140/62  Pulse: 91  Resp: 18  Temp: (!) 96.5 F (35.8 C)  SpO2: 98%  Weight: 187 lb 6.4 oz (85 kg)  Height: 5\' 9"  (1.753 m)   Body mass index is 27.67 kg/m. Physical Exam Vitals reviewed.  Constitutional:      General: He is not in acute distress.    Appearance: Normal appearance. He is overweight. He is not ill-appearing or diaphoretic.  HENT:     Head: Normocephalic.     Nose: Nose normal. No congestion or rhinorrhea.     Mouth/Throat:     Mouth: Mucous membranes are moist.     Pharynx: Oropharynx  is clear. No oropharyngeal exudate or posterior oropharyngeal erythema.     Comments: Palate and lips black in color without any tenderness,discharge and unable to scrap off  Eyes:     General: No scleral icterus.       Right eye: No discharge.        Left eye: No discharge.     Extraocular Movements: Extraocular movements intact.     Conjunctiva/sclera: Conjunctivae normal.     Pupils: Pupils are equal, round, and reactive to light.  Neck:     Vascular: No carotid bruit.  Cardiovascular:     Rate and Rhythm: Normal rate and regular rhythm.     Pulses: Normal pulses.     Heart sounds: Normal heart sounds. No murmur heard.    No friction  rub. No gallop.  Pulmonary:     Effort: Pulmonary effort is normal. No respiratory distress.     Breath sounds: Normal breath sounds. No wheezing, rhonchi or rales.  Chest:     Chest wall: No tenderness.  Abdominal:     General: Bowel sounds are normal. There is no distension.     Palpations: Abdomen is soft. There is no mass.     Tenderness: There is no abdominal tenderness. There is no right CVA tenderness, left CVA tenderness, guarding or rebound.  Musculoskeletal:        General: No swelling or tenderness. Normal range of motion.     Cervical back: Normal range of motion. No rigidity or tenderness.     Right lower leg: No edema.     Left lower leg: No edema.  Lymphadenopathy:     Cervical: No cervical adenopathy.  Skin:    General: Skin is warm and dry.     Coloration: Skin is not pale.     Findings: No bruising, erythema, lesion or rash.  Neurological:     Mental Status: He is alert and oriented to person, place, and time.     Gait: Gait normal.  Psychiatric:        Mood and Affect: Mood normal.        Speech: Speech normal.        Behavior: Behavior normal.    Labs reviewed: Recent Labs    03/11/22 1332 04/21/22 1815 05/01/22 1109  NA 136 133* 139  K 4.2 3.9 3.9  CL 104 101 106  CO2 24 23 27   GLUCOSE 100* 160* 76  BUN 7 11  9   CREATININE 0.93 0.81 0.87  CALCIUM 8.8* 8.5* 9.2   Recent Labs    05/01/22 1109  AST 18  ALT 11  BILITOT 0.4  PROT 7.2   Recent Labs    03/11/22 1332 04/21/22 1815 05/01/22 1109  WBC 5.7 6.0 6.7  HGB 14.7 14.0 13.2  HCT 43.2 41.8 39.7  MCV 87.6 88.4 90.8  PLT 209 264 230   Lab Results  Component Value Date   TSH 0.715 07/29/2012   Lab Results  Component Value Date   HGBA1C 5.9 (H) 02/27/2018   Lab Results  Component Value Date   CHOL 152 05/01/2022   HDL 66 05/01/2022   LDLCALC 72 05/01/2022   TRIG 64 05/01/2022   CHOLHDL 2.3 05/01/2022    Significant Diagnostic Results in last 30 days:  No results found.  Assessment/Plan  1. Essential hypertension Blood pressure not at goal though difficult to manage since patient adjusts medication on his own. - Advised to check Blood pressure at home and record on log provided and notify provider if B/p > 140/90 advised to bring log in 2 weeks for evaluation of blood pressure. - metoprolol succinate (TOPROL-XL) 25 MG 24 hr tablet; Take 1 tablet (25 mg total) by mouth daily. Take with or immediately following a meal.  Dispense: 90 tablet; Refill: 1 - lisinopril (ZESTRIL) 10 MG tablet; Take 1 tablet (10 mg total) by mouth daily.  Dispense: 90 tablet; Refill: 1 - hydrALAZINE (APRESOLINE) 25 MG tablet; Take 1 tablet (25 mg total) by mouth daily. TAKE 1 TABLET(25 MG) BY MOUTH THREE TIMES DAILY  Dispense: 90 tablet; Refill: 1 - TSH; Future - COMPLETE METABOLIC PANEL WITH GFR; Future - CBC with Differential/Platelet; Future  2. Erectile dysfunction due to arterial insufficiency Continues to require Viagra. - sildenafil (VIAGRA) 25 MG tablet; Take  1 tablet (25 mg total) by mouth daily as needed for erectile dysfunction.  Dispense: 10 tablet; Refill: 5  3. Mixed hyperlipidemia LDL at goal though patient states recently stopped taking statin.  Reports normal muscle weakness or myalgia. Advised to restart atorvastatin 40 mg  daily and exercise at least 3 times per week for 30 minutes -Dietary modification also advised - Lipid panel; Future  4. Tobacco use disorder, continuous -Platelet and lips skin discoloration noted from smoking. Smoking cessation advised states will quit smoking from today.  Declined medication to help him quit  5. Alcohol abuse Tells me he will stop drinking from today  Family/ staff Communication: Reviewed plan of care with patient verbalized understanding  Labs/tests ordered:  - TSH; Future - COMPLETE METABOLIC PANEL WITH GFR; Future - CBC with Differential/Platelet; Future - Lipid panel; Future  Next Appointment : Return in about 6 months (around 04/25/2023) for medical mangement of chronic issues., fasting labs in one week and B/p check in 2 weeks .   Caesar Bookman, NP

## 2022-10-30 ENCOUNTER — Other Ambulatory Visit: Payer: No Typology Code available for payment source

## 2022-10-30 DIAGNOSIS — I10 Essential (primary) hypertension: Secondary | ICD-10-CM

## 2022-10-30 DIAGNOSIS — E782 Mixed hyperlipidemia: Secondary | ICD-10-CM

## 2022-11-04 ENCOUNTER — Other Ambulatory Visit: Payer: Self-pay

## 2022-11-04 ENCOUNTER — Telehealth: Payer: No Typology Code available for payment source

## 2022-11-04 DIAGNOSIS — E782 Mixed hyperlipidemia: Secondary | ICD-10-CM

## 2022-11-04 MED ORDER — ATORVASTATIN CALCIUM 40 MG PO TABS: 40.0000 mg | ORAL_TABLET | Freq: Every day | ORAL | 1 refills | Status: DC

## 2022-11-04 NOTE — Telephone Encounter (Signed)
Patient called about lab results. 

## 2022-11-04 NOTE — Telephone Encounter (Signed)
Please see lab results.

## 2022-11-05 LAB — CBC WITH DIFFERENTIAL/PLATELET
Absolute Monocytes: 1011 cells/uL — ABNORMAL HIGH (ref 200–950)
Basophils Absolute: 87 cells/uL (ref 0–200)
Basophils Relative: 1.1 %
Eosinophils Absolute: 119 cells/uL (ref 15–500)
Eosinophils Relative: 1.5 %
HCT: 39.6 % (ref 38.5–50.0)
Hemoglobin: 13.6 g/dL (ref 13.2–17.1)
Lymphs Abs: 2978 cells/uL (ref 850–3900)
MCH: 31.6 pg (ref 27.0–33.0)
MCHC: 34.3 g/dL (ref 32.0–36.0)
MCV: 91.9 fL (ref 80.0–100.0)
MPV: 10.7 fL (ref 7.5–12.5)
Monocytes Relative: 12.8 %
Neutro Abs: 3705 cells/uL (ref 1500–7800)
Neutrophils Relative %: 46.9 %
Platelets: 238 10*3/uL (ref 140–400)
RBC: 4.31 10*6/uL (ref 4.20–5.80)
RDW: 13 % (ref 11.0–15.0)
Total Lymphocyte: 37.7 %
WBC: 7.9 10*3/uL (ref 3.8–10.8)

## 2022-11-05 LAB — COMPLETE METABOLIC PANEL WITH GFR
AG Ratio: 1.4 (calc) (ref 1.0–2.5)
ALT: 11 U/L (ref 9–46)
AST: 18 U/L (ref 10–35)
Albumin: 4.2 g/dL (ref 3.6–5.1)
Alkaline phosphatase (APISO): 72 U/L (ref 35–144)
BUN: 16 mg/dL (ref 7–25)
CO2: 25 mmol/L (ref 20–32)
Calcium: 9.5 mg/dL (ref 8.6–10.3)
Chloride: 102 mmol/L (ref 98–110)
Creat: 0.91 mg/dL (ref 0.70–1.30)
Globulin: 2.9 g/dL (calc) (ref 1.9–3.7)
Glucose, Bld: 100 mg/dL — ABNORMAL HIGH (ref 65–99)
Potassium: 4.2 mmol/L (ref 3.5–5.3)
Sodium: 138 mmol/L (ref 135–146)
Total Bilirubin: 0.5 mg/dL (ref 0.2–1.2)
Total Protein: 7.1 g/dL (ref 6.1–8.1)
eGFR: 98 mL/min/{1.73_m2} (ref >=60)

## 2022-11-05 LAB — HEMOGLOBIN A1C
Hgb A1c MFr Bld: 5.8 % of total Hgb — ABNORMAL HIGH (ref <5.7)
Mean Plasma Glucose: 120 mg/dL
eAG (mmol/L): 6.6 mmol/L

## 2022-11-05 LAB — TEST AUTHORIZATION

## 2022-11-05 LAB — LIPID PANEL
Cholesterol: 219 mg/dL — ABNORMAL HIGH (ref <200)
HDL: 66 mg/dL (ref >=40)
LDL Cholesterol (Calc): 136 mg/dL (calc) — ABNORMAL HIGH
Non-HDL Cholesterol (Calc): 153 mg/dL (calc) — ABNORMAL HIGH (ref <130)
Total CHOL/HDL Ratio: 3.3 (calc) (ref <5.0)
Triglycerides: 71 mg/dL (ref <150)

## 2022-11-05 LAB — TSH: TSH: 1.23 mIU/L (ref 0.40–4.50)

## 2022-11-08 ENCOUNTER — Encounter: Payer: No Typology Code available for payment source | Admitting: Family

## 2022-11-13 ENCOUNTER — Encounter: Payer: Self-pay | Admitting: Family

## 2022-11-13 NOTE — Patient Instructions (Addendum)
Visit your local pharmacy to receive your Tetanus, shingrix, and covid vaccines.

## 2022-11-14 ENCOUNTER — Encounter: Payer: No Typology Code available for payment source | Admitting: Family

## 2022-11-18 NOTE — Progress Notes (Signed)
  This encounter was created in error - please disregard. No show 

## 2022-12-16 ENCOUNTER — Telehealth: Payer: Self-pay

## 2022-12-16 NOTE — Telephone Encounter (Signed)
Patient called stating that he needs a substitute for Lisinopril. He states that he has been having a reaction to medication. He states that when he take the medication about 10 minutes later his chest burns. I informed patient that he would have to come to the office to be evaluated, but he stated that he didn't need to come to the office , he just needs a substitute called in.   Message routed to Marlowe Sax, NP

## 2022-12-16 NOTE — Telephone Encounter (Signed)
Recommend office visit to check blood pressure prior to ordering another medication.

## 2022-12-16 NOTE — Telephone Encounter (Signed)
Patient made appointment to be seen in office.

## 2022-12-19 ENCOUNTER — Ambulatory Visit (INDEPENDENT_AMBULATORY_CARE_PROVIDER_SITE_OTHER): Payer: No Typology Code available for payment source | Admitting: Family

## 2022-12-19 ENCOUNTER — Encounter: Payer: Self-pay | Admitting: Family

## 2022-12-19 VITALS — BP 188/88 | HR 74 | Temp 98.7°F | Resp 16 | Ht 69.0 in | Wt 187.2 lb

## 2022-12-19 DIAGNOSIS — Z1211 Encounter for screening for malignant neoplasm of colon: Secondary | ICD-10-CM | POA: Diagnosis not present

## 2022-12-19 DIAGNOSIS — K130 Diseases of lips: Secondary | ICD-10-CM | POA: Diagnosis not present

## 2022-12-19 DIAGNOSIS — I10 Essential (primary) hypertension: Secondary | ICD-10-CM | POA: Diagnosis not present

## 2022-12-19 DIAGNOSIS — K219 Gastro-esophageal reflux disease without esophagitis: Secondary | ICD-10-CM

## 2022-12-19 MED ORDER — HYDRALAZINE HCL 25 MG PO TABS: 25.0000 mg | ORAL_TABLET | Freq: Three times a day (TID) | ORAL | 1 refills | Status: DC

## 2022-12-19 MED ORDER — HYDRALAZINE HCL 50 MG PO TABS: 50.0000 mg | ORAL_TABLET | Freq: Two times a day (BID) | ORAL | 1 refills | Status: DC

## 2022-12-19 NOTE — Progress Notes (Signed)
Provider: Marlowe Sax FNP-C  Brylyn Novakovich, Nelda Bucks, NP  Patient Care Team: Sky Primo, Nelda Bucks, NP as PCP - General (Family Medicine) Martinique, Peter M, MD as PCP - Cardiology (Cardiology) Michel Bickers, MD as Consulting Physician (Infectious Diseases)  Extended Emergency Contact Information Primary Emergency Contact: Salifou,Garba Address: [address]          [place], [place] [address] [place] [employer] Phone: [phone] Relation: Brother Secondary Emergency Contact: Kone,Aidriss  [place] [employer] Phone: [phone] Relation: Friend  Code Status:  Full Code  Goals of care: Advanced Directive information    10/25/2022    2:31 PM  Advanced Directives  Does Patient Have a Medical Advance Directive? No  Would patient like information on creating a medical advance directive? No - Patient declined     Chief Complaint  Patient presents with   Medication Management    Discuss BP meds. Denies dizziness and SOB, blurry vision. He states that the BP med makes him have chest pain.     HPI:  Pt is a 57 y.o. male seen today for an acute visit for evaluation of high blood pressure.   Past Medical History:  Diagnosis Date   Gout    HIV infection ([place])    Hyperlipidemia    Hypertension    Substance abuse ([place])    alcohol    Tobacco abuse    Past Surgical History:  Procedure Laterality Date   WRIST FRACTURE SURGERY  ~ 2009   right; "put a plate in it" (624THL)    No Known Allergies  Outpatient Encounter Medications as of 12/19/2022  Medication Sig   aspirin EC 81 MG tablet Take 1 tablet (81 mg total) by mouth daily. Swallow whole.   atorvastatin (LIPITOR) 40 MG tablet Take 1 tablet (40 mg total) by mouth daily.   emtricitabine-rilpivir-tenofovir AF (ODEFSEY) 200-25-25 MG TABS tablet Take 1 tablet by mouth daily.   hydrALAZINE (APRESOLINE) 25 MG tablet Take 1 tablet (25 mg total) by mouth daily. TAKE 1 TABLET(25 MG) BY MOUTH THREE  TIMES DAILY   metoprolol succinate (TOPROL-XL) 25 MG 24 hr tablet Take 1 tablet (25 mg total) by mouth daily. Take with or immediately following a meal.   sildenafil (VIAGRA) 25 MG tablet Take 1 tablet (25 mg total) by mouth daily as needed for erectile dysfunction.   [DISCONTINUED] lisinopril (ZESTRIL) 10 MG tablet Take 1 tablet (10 mg total) by mouth daily.   simethicone (GAS-X) 80 MG chewable tablet Chew 1 tablet (80 mg total) by mouth every 6 (six) hours as needed for flatulence. (Patient not taking: Reported on 12/19/2022)   No facility-administered encounter medications on file as of 12/19/2022.    Review of Systems  Constitutional:  Negative for appetite change, chills, fatigue, fever and unexpected weight change.  HENT:  Negative for congestion, dental problem, ear discharge, ear pain, facial swelling, hearing loss, nosebleeds, postnasal drip, rhinorrhea, sinus pressure, sinus pain, sneezing, sore throat, tinnitus and trouble swallowing.   Eyes:  Negative for pain, discharge, redness, itching and visual disturbance.  Respiratory:  Negative for cough, chest tightness, shortness of breath and wheezing.   Cardiovascular:  Negative for chest pain, palpitations and leg swelling.  Gastrointestinal:  Negative for abdominal distention, abdominal pain, blood in stool, constipation, diarrhea, nausea and vomiting.  Endocrine: Negative for cold intolerance, heat intolerance, polydipsia, polyphagia and polyuria.  Genitourinary:  Negative for difficulty urinating, dysuria, flank pain, frequency and urgency.  Musculoskeletal:  Negative for  arthralgias, back pain, gait problem, joint swelling, myalgias, neck pain and neck stiffness.  Skin:  Negative for color change, pallor, rash and wound.  Neurological:  Negative for dizziness, syncope, speech difficulty, weakness, light-headedness, numbness and headaches.  Hematological:  Does not bruise/bleed easily.  Psychiatric/Behavioral:  Negative for agitation,  behavioral problems, confusion, hallucinations, self-injury, sleep disturbance and suicidal ideas. The patient is not nervous/anxious.     Immunization History  Administered Date(s) Administered   Influenza,inj,Quad PF,6+ Mos 06/27/2015, 07/29/2016, 07/20/2019   Influenza-Unspecified 06/27/2015, 07/29/2016, 08/11/2020   PPD Test 04/25/2015   Pneumococcal Polysaccharide-23 04/25/2015   Pertinent  Health Maintenance Due  Topic Date Due   COLONOSCOPY (Pts 45-54yr Insurance coverage will need to be confirmed)  Never done   INFLUENZA VACCINE  01/05/2023 (Originally 05/07/2022)      09/19/2021    8:29 AM 10/29/2021    3:56 PM 03/11/2022   11:42 AM 04/21/2022    6:01 PM 10/25/2022    2:31 PM  Fall Risk  Falls in the past year? 0 0   0  Was there an injury with Fall? 0 0   0  Fall Risk Category Calculator 0 0   0  Fall Risk Category (Retired) Low Low     (RETIRED) Patient Fall Risk Level Low fall risk Low fall risk Moderate fall risk Low fall risk   Patient at Risk for Falls Due to No Fall Risks No Fall Risks   No Fall Risks  Fall risk Follow up Falls evaluation completed Falls evaluation completed   Falls evaluation completed   Functional Status Survey:    Vitals:   12/19/22 1046 12/19/22 1048  BP: (!) 190/88 (!) 188/88  Pulse: 74   Resp: 16   Temp: 98.7 F (37.1 C)   TempSrc: Temporal   SpO2: 97%   Weight: 187 lb 3.2 oz (84.9 kg)   Height: '5\' 9"'$  (1.753 m)    Body mass index is 27.64 kg/m. Physical Exam Vitals reviewed.  Constitutional:      General: He is not in acute distress.    Appearance: Normal appearance. He is overweight. He is not ill-appearing or diaphoretic.  HENT:     Head: Normocephalic.     Right Ear: Tympanic membrane, ear canal and external ear normal. There is no impacted cerumen.     Left Ear: Tympanic membrane, ear canal and external ear normal. There is no impacted cerumen.     Nose: Nose normal. No congestion or rhinorrhea.     Mouth/Throat:      Mouth: Mucous membranes are moist.     Pharynx: Oropharynx is clear. No oropharyngeal exudate or posterior oropharyngeal erythema.     Comments: Dark lesion on lower lip non-tender to touch  Palate and lips are black in color without any lesion.  Eyes:     General: No scleral icterus.       Right eye: No discharge.        Left eye: No discharge.     Extraocular Movements: Extraocular movements intact.     Conjunctiva/sclera: Conjunctivae normal.     Pupils: Pupils are equal, round, and reactive to light.  Neck:     Vascular: No carotid bruit.  Cardiovascular:     Rate and Rhythm: Normal rate and regular rhythm.     Pulses: Normal pulses.     Heart sounds: Normal heart sounds. No murmur heard.    No friction rub. No gallop.  Pulmonary:     Effort: Pulmonary  effort is normal. No respiratory distress.     Breath sounds: Normal breath sounds. No wheezing, rhonchi or rales.  Chest:     Chest wall: No tenderness.  Abdominal:     General: Bowel sounds are normal. There is no distension.     Palpations: Abdomen is soft. There is no mass.     Tenderness: There is no abdominal tenderness. There is no right CVA tenderness, left CVA tenderness, guarding or rebound.  Musculoskeletal:        General: No swelling or tenderness. Normal range of motion.     Cervical back: Normal range of motion. No rigidity or tenderness.     Right lower leg: No edema.     Left lower leg: No edema.  Lymphadenopathy:     Cervical: No cervical adenopathy.  Skin:    General: Skin is warm and dry.     Coloration: Skin is not pale.     Findings: No bruising, erythema or rash.  Neurological:     Mental Status: He is alert and oriented to person, place, and time.     Cranial Nerves: No cranial nerve deficit.     Sensory: No sensory deficit.     Motor: No weakness.     Coordination: Coordination normal.     Gait: Gait normal.  Psychiatric:        Mood and Affect: Mood normal.        Speech: Speech normal.         Behavior: Behavior normal.     Labs reviewed: Recent Labs    04/21/22 1815 05/01/22 1109 10/30/22 0836  NA 133* 139 138  K 3.9 3.9 4.2  CL 101 106 102  CO2 '23 27 25  '$ GLUCOSE 160* 76 100*  BUN '11 9 16  '$ CREATININE 0.81 0.87 0.91  CALCIUM 8.5* 9.2 9.5   Recent Labs    05/01/22 1109 10/30/22 0836  AST 18 18  ALT 11 11  BILITOT 0.4 0.5  PROT 7.2 7.1   Recent Labs    04/21/22 1815 05/01/22 1109 10/30/22 0836  WBC 6.0 6.7 7.9  NEUTROABS  --   --  3,705  HGB 14.0 13.2 13.6  HCT 41.8 39.7 39.6  MCV 88.4 90.8 91.9  PLT 264 230 238   Lab Results  Component Value Date   TSH 1.23 10/30/2022   Lab Results  Component Value Date   HGBA1C 5.8 (H) 10/30/2022   Lab Results  Component Value Date   CHOL 219 (H) 10/30/2022   HDL 66 10/30/2022   LDLCALC 136 (H) 10/30/2022   TRIG 71 10/30/2022   CHOLHDL 3.3 10/30/2022    Significant Diagnostic Results in last 30 days:  No results found.  Assessment/Plan 1. Essential hypertension B/p elevated recheck still high.Not taking Hydralazine three times as directed.states will take only whenever he feels bad or B/p is high.I have discussed with him importance of taking medication daily to keep B/p down.Patient fear medication side effects.He was informed of complication of uncontrolled B/p.  - Lisinopril discontinued per patient's request " causes Heartburn"  - will follow up in 2 weeks.consider adding amlodipine - hydrALAZINE (APRESOLINE) 25 MG tablet; Take 1 tablet (25 mg total) by mouth 3 (three) times daily.  Dispense: 90 tablet; Refill: 1  2. Colon cancer screening Asymptomatic  prefers Cologuard verse colonoscopy. - Cologuard  3. Gastroesophageal reflux disease without esophagitis States symptoms worsen after taking lisinopril.want lisinopril discontinued. PPI's contraindicated with her antiviral medication. - continue to  monitor   4. Lip lesion Dark lesion on lower lip non-tender to touch.Palate and lips are  black in color without any lesion.  will send referral to ENT for further evaluation.  - Ambulatory referral to ENT  Family/ staff Communication: Reviewed plan of care with patient verbalized understanding   Labs/tests ordered: None   Next Appointment: Return in about 2 weeks (around 01/02/2023) for Folow up Blood pressure .   Sandrea Hughs, NP

## 2022-12-20 ENCOUNTER — Telehealth: Payer: Self-pay

## 2022-12-20 MED ORDER — LISINOPRIL 10 MG PO TABS: 10.0000 mg | ORAL_TABLET | Freq: Every day | ORAL | 3 refills | Status: DC

## 2022-12-20 NOTE — Telephone Encounter (Signed)
Patient called requesting that he be started back on Lisinopril. Patient stated that when he just took the two medications (metoprolol and hydralazine))that his " heart hurts". When he took the lisinopril it stopped.  I had a verbal conversation with Erie Noe, NP and she agreed to start patient back on the lisinopril along with the hydralazine and metoprolol. She stated that if patient is still having the same symptoms then may need to refer to gastroenterologist for further evaluation.

## 2022-12-20 NOTE — Telephone Encounter (Signed)
May restart lisinopril as requested.

## 2023-01-02 ENCOUNTER — Ambulatory Visit (INDEPENDENT_AMBULATORY_CARE_PROVIDER_SITE_OTHER): Payer: No Typology Code available for payment source | Admitting: Family

## 2023-01-02 ENCOUNTER — Encounter: Payer: Self-pay | Admitting: Family

## 2023-01-02 VITALS — BP 168/78 | HR 80 | Temp 97.5°F | Resp 16 | Ht 69.0 in | Wt 186.4 lb

## 2023-01-02 DIAGNOSIS — N5201 Erectile dysfunction due to arterial insufficiency: Secondary | ICD-10-CM

## 2023-01-02 DIAGNOSIS — I10 Essential (primary) hypertension: Secondary | ICD-10-CM

## 2023-01-02 DIAGNOSIS — M109 Gout, unspecified: Secondary | ICD-10-CM

## 2023-01-02 DIAGNOSIS — J42 Unspecified chronic bronchitis: Secondary | ICD-10-CM

## 2023-01-02 DIAGNOSIS — Z716 Tobacco abuse counseling: Secondary | ICD-10-CM

## 2023-01-02 DIAGNOSIS — B20 Human immunodeficiency virus [HIV] disease: Secondary | ICD-10-CM | POA: Diagnosis not present

## 2023-01-02 MED ORDER — ALLOPURINOL 100 MG PO TABS: 100.0000 mg | ORAL_TABLET | Freq: Every day | ORAL | 6 refills | Status: DC

## 2023-01-02 MED ORDER — KETOROLAC TROMETHAMINE 30 MG/ML IJ SOLN
30.0000 mg | Freq: Once | INTRAMUSCULAR | Status: AC
  Administered 2023-01-02: 30 mg via INTRAMUSCULAR

## 2023-01-02 MED ORDER — SILDENAFIL CITRATE 25 MG PO TABS: 25.0000 mg | ORAL_TABLET | Freq: Every day | ORAL | 5 refills | Status: DC | PRN

## 2023-01-02 MED ORDER — PREDNISONE 20 MG PO TABS: ORAL_TABLET | ORAL | 0 refills | Status: AC

## 2023-01-02 NOTE — Progress Notes (Signed)
Provider: Marlowe Sax FNP-C  Kamillah Didonato, Nelda Bucks, NP  Patient Care Team: Damire Remedios, Nelda Bucks, NP as PCP - General (Family Medicine) [place], Peter M, MD as PCP - Cardiology (Cardiology) Michel Bickers, MD as Consulting Physician (Infectious Diseases)  Extended Emergency Contact Information Primary Emergency Contact: Salifou,Garba Address: [address]          [place], [place] [address] [place] Phone: [phone] Relation: Brother Secondary Emergency Contact: Kone,Aidriss  [place] Phone: [phone] Relation: Friend  Code Status:  Full Code  Goals of care: Advanced Directive information    10/25/2022    2:31 PM  Advanced Directives  Does Patient Have a Medical Advance Directive? No  Would patient like information on creating a medical advance directive? No - Patient declined     Chief Complaint  Patient presents with   Medical Management of Chronic Issues    2 week Bp log. Has gout in his right hand, swollen and pain    HPI:  Pt is a 57 y.o. male seen today for an acute visit for 2 weeks evaluation of high blood pressure.Has been taking his medication as prescribed.States blood pressure high today due to right hand pain and swelling due to gout.states unable to use hand due to pain and swelling of fingers for several days.He did not notify provider or call office.He states has gotten used to gout pain did call to be seen. Thinks gout flared up due to drinking beer. He denies any injury to hand, fever or chills.  Also states was told to follow up for Cologuard results.states got a text message from the labs saying to follow up with PCP.states was very scared coming in today afraid to hear the results.on chart review,Cologuard not yet resulted or released. Patient showed provider text message on his phone which stated Cologuard specimen was received and results will be send to PCP for follow up. Patient reassured.results still  in process.   He also request refills on Viagra.   States has quit smoking.Though has strong cigarette odor.    Past Medical History:  Diagnosis Date   Gout    HIV infection ([place])    Hyperlipidemia    Hypertension    Substance abuse ([place])    alcohol    Tobacco abuse    Past Surgical History:  Procedure Laterality Date   WRIST FRACTURE SURGERY  ~ 2009   right; "put a plate in it" (624THL)    No Known Allergies  Outpatient Encounter Medications as of 01/02/2023  Medication Sig   aspirin EC 81 MG tablet Take 1 tablet (81 mg total) by mouth daily. Swallow whole.   atorvastatin (LIPITOR) 40 MG tablet Take 1 tablet (40 mg total) by mouth daily.   emtricitabine-rilpivir-tenofovir AF (ODEFSEY) 200-25-25 MG TABS tablet Take 1 tablet by mouth daily.   hydrALAZINE (APRESOLINE) 25 MG tablet Take 1 tablet (25 mg total) by mouth 3 (three) times daily.   lisinopril (ZESTRIL) 10 MG tablet Take 1 tablet (10 mg total) by mouth daily.   metoprolol succinate (TOPROL-XL) 25 MG 24 hr tablet Take 1 tablet (25 mg total) by mouth daily. Take with or immediately following a meal.   sildenafil (VIAGRA) 25 MG tablet Take 1 tablet (25 mg total) by mouth daily as needed for erectile dysfunction.   simethicone (GAS-X) 80 MG chewable tablet Chew 1 tablet (80 mg total) by mouth every 6 (six) hours as needed for flatulence.   No  facility-administered encounter medications on file as of 01/02/2023.    Review of Systems  Constitutional:  Negative for appetite change, chills, fatigue, fever and unexpected weight change.  HENT:  Negative for congestion, dental problem, ear discharge, ear pain, facial swelling, hearing loss, nosebleeds, postnasal drip, rhinorrhea, sinus pressure, sinus pain, sneezing, sore throat, tinnitus and trouble swallowing.   Eyes:  Negative for pain, discharge, redness, itching and visual disturbance.  Respiratory:  Negative for cough, chest tightness, shortness of breath and wheezing.    Cardiovascular:  Negative for chest pain, palpitations and leg swelling.  Gastrointestinal:  Negative for abdominal distention, abdominal pain, blood in stool, constipation, diarrhea, nausea and vomiting.  Genitourinary:  Negative for difficulty urinating, dysuria, flank pain, frequency and urgency.  Musculoskeletal:  Positive for joint swelling. Negative for arthralgias, back pain, gait problem and myalgias.  Skin:  Negative for color change, pallor, rash and wound.  Neurological:  Negative for dizziness, weakness, light-headedness, numbness and headaches.    Immunization History  Administered Date(s) Administered   Influenza,inj,Quad PF,6+ Mos 06/27/2015, 07/29/2016, 07/20/2019   Influenza-Unspecified 06/27/2015, 07/29/2016, 08/11/2020   PPD Test 04/25/2015   Pneumococcal Polysaccharide-23 04/25/2015   Pertinent  Health Maintenance Due  Topic Date Due   COLONOSCOPY (Pts 45-46yrs Insurance coverage will need to be confirmed)  Never done   INFLUENZA VACCINE  01/05/2023 (Originally 05/07/2022)      09/19/2021    8:29 AM 10/29/2021    3:56 PM 03/11/2022   11:42 AM 04/21/2022    6:01 PM 10/25/2022    2:31 PM  Fall Risk  Falls in the past year? 0 0   0  Was there an injury with Fall? 0 0   0  Fall Risk Category Calculator 0 0   0  Fall Risk Category (Retired) Low Low     (RETIRED) Patient Fall Risk Level Low fall risk Low fall risk Moderate fall risk Low fall risk   Patient at Risk for Falls Due to No Fall Risks No Fall Risks   No Fall Risks  Fall risk Follow up Falls evaluation completed Falls evaluation completed   Falls evaluation completed   Functional Status Survey:    Vitals:   01/02/23 1050  BP: (!) 168/78  Pulse: 80  Resp: 16  Temp: (!) 97.5 F (36.4 C)  TempSrc: Temporal  SpO2: 96%  Weight: 186 lb 6.4 oz (84.6 kg)  Height: 5\' 9"  (1.753 m)   Body mass index is 27.53 kg/m. Physical Exam Vitals reviewed.  Constitutional:      General: He is not in acute  distress.    Appearance: Normal appearance. He is overweight. He is not ill-appearing or diaphoretic.     Comments: Strong cigarette odor though reports has quit smoking   HENT:     Head: Normocephalic.     Right Ear: Tympanic membrane, ear canal and external ear normal. There is no impacted cerumen.     Left Ear: Tympanic membrane, ear canal and external ear normal. There is no impacted cerumen.     Nose: Nose normal. No congestion or rhinorrhea.     Mouth/Throat:     Mouth: Mucous membranes are moist.     Pharynx: Oropharynx is clear. No oropharyngeal exudate or posterior oropharyngeal erythema.  Eyes:     General: No scleral icterus.       Right eye: No discharge.        Left eye: No discharge.     Extraocular Movements: Extraocular movements intact.  Conjunctiva/sclera: Conjunctivae normal.     Pupils: Pupils are equal, round, and reactive to light.  Neck:     Vascular: No carotid bruit.  Cardiovascular:     Rate and Rhythm: Normal rate and regular rhythm.     Pulses: Normal pulses.     Heart sounds: Murmur heard.     No friction rub. No gallop.  Pulmonary:     Effort: Pulmonary effort is normal. No respiratory distress.     Breath sounds: Normal breath sounds. No wheezing, rhonchi or rales.  Chest:     Chest wall: No tenderness.  Abdominal:     General: Bowel sounds are normal. There is no distension.     Palpations: Abdomen is soft. There is no mass.     Tenderness: There is no abdominal tenderness. There is no right CVA tenderness, left CVA tenderness, guarding or rebound.  Musculoskeletal:        General: No swelling.     Right hand: Swelling and tenderness present. No deformity. Decreased range of motion. Decreased strength. Normal sensation. Normal capillary refill. Normal pulse.     Left hand: Normal.     Cervical back: Normal range of motion. No rigidity or tenderness.     Right lower leg: No edema.     Left lower leg: No edema.  Lymphadenopathy:     Cervical:  No cervical adenopathy.  Skin:    General: Skin is warm and dry.     Coloration: Skin is not pale.     Findings: No bruising, erythema, lesion or rash.  Neurological:     Mental Status: He is alert and oriented to person, place, and time.     Cranial Nerves: No cranial nerve deficit.     Sensory: No sensory deficit.     Motor: No weakness.     Coordination: Coordination normal.     Gait: Gait normal.  Psychiatric:        Mood and Affect: Mood normal.        Speech: Speech normal.        Behavior: Behavior normal.    Labs reviewed: Recent Labs    04/21/22 1815 05/01/22 1109 10/30/22 0836  NA 133* 139 138  K 3.9 3.9 4.2  CL 101 106 102  CO2 23 27 25   GLUCOSE 160* 76 100*  BUN 11 9 16   CREATININE 0.81 0.87 0.91  CALCIUM 8.5* 9.2 9.5   Recent Labs    05/01/22 1109 10/30/22 0836  AST 18 18  ALT 11 11  BILITOT 0.4 0.5  PROT 7.2 7.1   Recent Labs    04/21/22 1815 05/01/22 1109 10/30/22 0836  WBC 6.0 6.7 7.9  NEUTROABS  --   --  3,705  HGB 14.0 13.2 13.6  HCT 41.8 39.7 39.6  MCV 88.4 90.8 91.9  PLT 264 230 238   Lab Results  Component Value Date   TSH 1.23 10/30/2022   Lab Results  Component Value Date   HGBA1C 5.8 (H) 10/30/2022   Lab Results  Component Value Date   CHOL 219 (H) 10/30/2022   HDL 66 10/30/2022   LDLCALC 136 (H) 10/30/2022   TRIG 71 10/30/2022   CHOLHDL 3.3 10/30/2022    Significant Diagnostic Results in last 30 days:  No results found.  Assessment/Plan 1. Erectile dysfunction due to arterial insufficiency Request refills on viagra  - sildenafil (VIAGRA) 25 MG tablet; Take 1 tablet (25 mg total) by mouth daily as needed for erectile dysfunction.  Dispense: 10 tablet; Refill: 5  2. Acute gouty arthritis Afebrile Diffused swelling worst on right index,middle and ring finger.Tender and warm to touch.  - Toradal 30 mg I.m given for pain  - will start on Prednisone which has been effective in the past then advised to start on  allopurinol once he completes prednisone.  - Notify provider if symptoms not resolved or recurs.   - allopurinol (ZYLOPRIM) 100 MG tablet; Take 1 tablet (100 mg total) by mouth daily.  Dispense: 30 tablet; Refill: 6 - predniSONE (DELTASONE) 20 MG tablet; Take 2 tablets (40 mg total) by mouth daily with breakfast for 1 day, THEN 1.5 tablets (30 mg total) daily with breakfast for 1 day, THEN 1 tablet (20 mg total) daily with breakfast for 1 day, THEN 0.5 tablets (10 mg total) daily with breakfast for 1 day.  Dispense: 5 tablet; Refill: 0 - ketorolac (TORADOL) 30 MG/ML injection 30 mg  3. HIV disease ([place]) - continue on [place]  - continue to follow up with infectious disease.   4. Chronic bronchitis, unspecified chronic bronchitis type (HCC) Breathing stable. States has quit smoking though strong cigarette odor noted.   5. Essential hypertension B/p elevated today though in pain due to right hand gout. - continue on Hydralazine,lisinopril and metoprolol  -  Advised to check Blood pressure at home and record on log provided and notify provider if B/p > 140/90    6. Encounter for smoking cessation counseling Reports has quit smoking though strong cigarette odor noted.Smoking cessation advised.   Family/ staff Communication: Reviewed plan of care with patient verbalized understanding   Labs/tests ordered: None   Next Appointment: Return if symptoms worsen or fail to improve.   Sandrea Hughs, NP

## 2023-01-03 LAB — COLOGUARD: COLOGUARD: NEGATIVE

## 2023-04-07 ENCOUNTER — Other Ambulatory Visit: Payer: Self-pay | Admitting: Family

## 2023-04-07 DIAGNOSIS — I10 Essential (primary) hypertension: Secondary | ICD-10-CM

## 2023-04-17 ENCOUNTER — Other Ambulatory Visit: Payer: Self-pay

## 2023-04-17 ENCOUNTER — Other Ambulatory Visit: Payer: Self-pay | Admitting: Family

## 2023-04-17 DIAGNOSIS — I10 Essential (primary) hypertension: Secondary | ICD-10-CM

## 2023-04-17 MED ORDER — HYDRALAZINE HCL 25 MG PO TABS: 25.0000 mg | ORAL_TABLET | Freq: Three times a day (TID) | ORAL | 3 refills | Status: DC

## 2023-04-23 ENCOUNTER — Telehealth: Payer: Self-pay

## 2023-04-23 MED ORDER — AMLODIPINE BESYLATE 5 MG PO TABS: 5.0000 mg | ORAL_TABLET | Freq: Every day | ORAL | 11 refills | Status: DC

## 2023-04-23 NOTE — Telephone Encounter (Signed)
Patient is aware of interaction with the pharmacist and requested that Ngetich, Dinah C, NP send in an alternative for the hydralazine

## 2023-04-23 NOTE — Telephone Encounter (Signed)
Left detailed message voicemail with Dinah's response. RX sent to pharmacy. Patient has an appointment scheduled for 04/30/23

## 2023-04-23 NOTE — Telephone Encounter (Signed)
Patient called stating he is out of his hydralazine and the pharmacy states they have not received a reply from [place] to refill. According to our records, rx was approved on 04/17/23 #90/3 refills  Patient informed that I will call [facility] and circle back with him.  Outgoing call placed to [facility], spoke with pharmacist and rx is not covered. I asked if they have documentation of notifying us to prompt [place] to change rx and they had not.

## 2023-04-23 NOTE — Telephone Encounter (Signed)
-   discontinue Hydralazine due to not being covered by insurance  - start on Amlodipine 5 mg tablet one by mouth daily for Blood pressure  - please schedule appointment in 2 weeks to recheck blood pressure then adjust medication if needed.

## 2023-04-30 ENCOUNTER — Encounter: Payer: Self-pay | Admitting: Family

## 2023-04-30 ENCOUNTER — Ambulatory Visit (INDEPENDENT_AMBULATORY_CARE_PROVIDER_SITE_OTHER): Payer: Self-pay | Admitting: Family

## 2023-04-30 VITALS — BP 120/66 | HR 72 | Temp 97.5°F | Resp 18 | Ht 69.0 in | Wt 194.0 lb

## 2023-04-30 DIAGNOSIS — E782 Mixed hyperlipidemia: Secondary | ICD-10-CM

## 2023-04-30 DIAGNOSIS — I10 Essential (primary) hypertension: Secondary | ICD-10-CM

## 2023-04-30 DIAGNOSIS — K219 Gastro-esophageal reflux disease without esophagitis: Secondary | ICD-10-CM

## 2023-04-30 DIAGNOSIS — R7303 Prediabetes: Secondary | ICD-10-CM

## 2023-04-30 MED ORDER — LISINOPRIL 10 MG PO TABS
10.0000 mg | ORAL_TABLET | Freq: Every day | ORAL | 3 refills | Status: DC
Start: 2023-04-30 — End: 2023-08-11

## 2023-04-30 MED ORDER — ASPIRIN 81 MG PO TBEC: 81.0000 mg | DELAYED_RELEASE_TABLET | Freq: Every day | ORAL | Status: DC

## 2023-04-30 MED ORDER — METOPROLOL SUCCINATE ER 25 MG PO TB24
25.0000 mg | ORAL_TABLET | Freq: Every day | ORAL | 0 refills | Status: DC
Start: 2023-04-30 — End: 2023-07-14

## 2023-04-30 NOTE — Progress Notes (Signed)
Provider: Richarda Blade FNP-C   Nour Rodrigues, Donalee Citrin, NP  Patient Care Team: Ryelee Albee, Donalee Citrin, NP as PCP - General (Family Medicine) [place], Peter M, MD as PCP - Cardiology (Cardiology) Cliffton Asters, MD (Inactive) as Consulting Physician (Infectious Diseases)  Extended Emergency Contact Information Primary Emergency Contact: Salifou,Garba Address: [address]          [place], [place] [address] [place] Phone: [phone] Relation: Brother Secondary Emergency Contact: Kone,Aidriss  [place] Phone: [phone] Relation: Friend  Code Status:  Full Code  Goals of care: Advanced Directive information    04/30/2023    8:59 AM  Advanced Directives  Does Patient Have a Medical Advance Directive? No  Would patient like information on creating a medical advance directive? No - Patient declined     Chief Complaint  Patient presents with   Medical Management of Chronic Issues    Patient is here for a 20M follow up for chronic conditions    HPI:  Pt is a 57 y.o. male seen today for 6 months follow up for medical management of chronic diseases.    Hypertension - b/p at home runs in the 140's- 150's states not taking amlodipine.Thinks he takes too much medication.  Hyperlipidemia - on Atorvastatin .states does stretching exercise in his room. Has been eating salad most of the time.   No longer using Viagra.   States had Tdap vaccine at the infectious disease but [facility] CMA called ID and was told had no records for Tdap.    Due for shingles and COVID19 vaccine aware to get at the pharmacy.  Past Medical History:  Diagnosis Date   Gout    HIV infection (HCC)    Hyperlipidemia    Hypertension    Substance abuse (HCC)    alcohol    Tobacco abuse    Past Surgical History:  Procedure Laterality Date   WRIST FRACTURE SURGERY  ~ 2009   right; "put a plate in it" (13/24/4010)    No Known Allergies  Allergies as of  04/30/2023   No Known Allergies      Medication List        Accurate as of April 30, 2023 10:05 PM. If you have any questions, ask your nurse or doctor.          STOP taking these medications    allopurinol 100 MG tablet Commonly known as: ZYLOPRIM Stopped by: Donalee Citrin Deyana Wnuk   sildenafil 25 MG tablet Commonly known as: Viagra Stopped by: Donalee Citrin Anvi Mangal       TAKE these medications    amLODipine 5 MG tablet Commonly known as: NORVASC Take 1 tablet (5 mg total) by mouth daily.   aspirin EC 81 MG tablet Take 1 tablet (81 mg total) by mouth daily. Swallow whole.   atorvastatin 40 MG tablet Commonly known as: LIPITOR Take 1 tablet (40 mg total) by mouth daily.   hydrALAZINE 25 MG tablet Commonly known as: APRESOLINE Take 25 mg by mouth 3 (three) times daily.   lisinopril 10 MG tablet Commonly known as: ZESTRIL Take 1 tablet (10 mg total) by mouth daily.   metoprolol succinate 25 MG 24 hr tablet Commonly known as: TOPROL-XL Take 1 tablet (25 mg total) by mouth daily. What changed: See the new instructions. Changed by: Henri Guedes C Rhylen Pulido   Odefsey 200-25-25 MG Tabs tablet Generic drug: emtricitabine-rilpivir-tenofovir AF Take 1 tablet by mouth daily.   simethicone 80  MG chewable tablet Commonly known as: Gas-X Chew 1 tablet (80 mg total) by mouth every 6 (six) hours as needed for flatulence.        Review of Systems  Constitutional:  Negative for appetite change, chills, fatigue, fever and unexpected weight change.  HENT:  Negative for congestion, dental problem, ear discharge, ear pain, facial swelling, hearing loss, nosebleeds, postnasal drip, rhinorrhea, sinus pressure, sinus pain, sneezing, sore throat, tinnitus and trouble swallowing.   Eyes:  Negative for pain, discharge, redness, itching and visual disturbance.  Respiratory:  Negative for cough, chest tightness, shortness of breath and wheezing.   Cardiovascular:  Negative for chest pain,  palpitations and leg swelling.  Gastrointestinal:  Negative for abdominal distention, abdominal pain, blood in stool, constipation, diarrhea, nausea and vomiting.  Endocrine: Negative for cold intolerance, heat intolerance, polydipsia, polyphagia and polyuria.  Genitourinary:  Negative for difficulty urinating, dysuria, flank pain, frequency and urgency.  Musculoskeletal:  Negative for arthralgias, back pain, gait problem, joint swelling, myalgias, neck pain and neck stiffness.  Skin:  Negative for color change, pallor, rash and wound.  Neurological:  Negative for dizziness, syncope, speech difficulty, weakness, light-headedness, numbness and headaches.  Hematological:  Does not bruise/bleed easily.  Psychiatric/Behavioral:  Negative for agitation, behavioral problems, confusion, hallucinations, self-injury, sleep disturbance and suicidal ideas. The patient is not nervous/anxious.     Immunization History  Administered Date(s) Administered   Influenza,inj,Quad PF,6+ Mos 06/27/2015, 07/29/2016, 07/20/2019   Influenza-Unspecified 06/27/2015, 07/29/2016, 08/11/2020   PPD Test 04/25/2015   Pneumococcal Polysaccharide-23 04/25/2015   Pneumococcal-Unspecified 04/25/2015   Pertinent  Health Maintenance Due  Topic Date Due   Colonoscopy  Never done   INFLUENZA VACCINE  05/08/2023      10/29/2021    3:56 PM 03/11/2022   11:42 AM 04/21/2022    6:01 PM 10/25/2022    2:31 PM 04/30/2023    9:00 AM  Fall Risk  Falls in the past year? 0   0 0  Was there an injury with Fall? 0   0 0  Fall Risk Category Calculator 0   0 0  Fall Risk Category (Retired) Low      (RETIRED) Patient Fall Risk Level Low fall risk Moderate fall risk Low fall risk    Patient at Risk for Falls Due to No Fall Risks   No Fall Risks No Fall Risks  Fall risk Follow up Falls evaluation completed   Falls evaluation completed Falls evaluation completed   Functional Status Survey:    Vitals:   04/30/23 0859  BP: 120/66  Pulse:  72  Resp: 18  Temp: (!) 97.5 F (36.4 C)  SpO2: 97%  Weight: 194 lb (88 kg)  Height: 5\' 9"  (1.753 m)   Body mass index is 28.65 kg/m. Physical Exam Vitals reviewed.  Constitutional:      General: He is not in acute distress.    Appearance: Normal appearance. He is overweight. He is not ill-appearing or diaphoretic.  HENT:     Head: Normocephalic.     Right Ear: Tympanic membrane, ear canal and external ear normal. There is no impacted cerumen.     Left Ear: Tympanic membrane, ear canal and external ear normal. There is no impacted cerumen.     Nose: Nose normal. No congestion or rhinorrhea.     Mouth/Throat:     Mouth: Mucous membranes are moist.     Pharynx: Oropharynx is clear. No oropharyngeal exudate or posterior oropharyngeal erythema.  Eyes:  General: No scleral icterus.       Right eye: No discharge.        Left eye: No discharge.     Extraocular Movements: Extraocular movements intact.     Conjunctiva/sclera: Conjunctivae normal.     Pupils: Pupils are equal, round, and reactive to light.  Neck:     Vascular: No carotid bruit.  Cardiovascular:     Rate and Rhythm: Normal rate and regular rhythm.     Pulses: Normal pulses.     Heart sounds: Normal heart sounds. No murmur heard.    No friction rub. No gallop.  Pulmonary:     Effort: Pulmonary effort is normal. No respiratory distress.     Breath sounds: Normal breath sounds. No wheezing, rhonchi or rales.  Chest:     Chest wall: No tenderness.  Abdominal:     General: Bowel sounds are normal. There is no distension.     Palpations: Abdomen is soft. There is no mass.     Tenderness: There is no abdominal tenderness. There is no right CVA tenderness, left CVA tenderness, guarding or rebound.  Musculoskeletal:        General: No swelling or tenderness. Normal range of motion.     Cervical back: Normal range of motion. No rigidity or tenderness.     Right lower leg: No edema.     Left lower leg: No edema.   Lymphadenopathy:     Cervical: No cervical adenopathy.  Skin:    General: Skin is warm and dry.     Coloration: Skin is not pale.     Findings: No bruising, erythema, lesion or rash.  Neurological:     Mental Status: He is alert and oriented to person, place, and time.     Cranial Nerves: No cranial nerve deficit.     Sensory: No sensory deficit.     Motor: No weakness.     Coordination: Coordination normal.     Gait: Gait normal.  Psychiatric:        Mood and Affect: Mood normal.        Speech: Speech normal.        Behavior: Behavior normal.        Thought Content: Thought content normal.        Judgment: Judgment normal.    Labs reviewed: Recent Labs    05/01/22 1109 10/30/22 0836  NA 139 138  K 3.9 4.2  CL 106 102  CO2 27 25  GLUCOSE 76 100*  BUN 9 16  CREATININE 0.87 0.91  CALCIUM 9.2 9.5   Recent Labs    05/01/22 1109 10/30/22 0836  AST 18 18  ALT 11 11  BILITOT 0.4 0.5  PROT 7.2 7.1   Recent Labs    05/01/22 1109 10/30/22 0836  WBC 6.7 7.9  NEUTROABS  --  3,705  HGB 13.2 13.6  HCT 39.7 39.6  MCV 90.8 91.9  PLT 230 238   Lab Results  Component Value Date   TSH 1.23 10/30/2022   Lab Results  Component Value Date   HGBA1C 5.8 (H) 10/30/2022   Lab Results  Component Value Date   CHOL 219 (H) 10/30/2022   HDL 66 10/30/2022   LDLCALC 136 (H) 10/30/2022   TRIG 71 10/30/2022   CHOLHDL 3.3 10/30/2022    Significant Diagnostic Results in last 30 days:  No results found.  Assessment/Plan  1. Essential hypertension B/p well controlled  - continue on Lisinopril,metoprolol and Hydralazine  -  declined to take amlodipine thinks taking too many B/p medication.Discussed with patient B/p goal since home b/p readings still not at goal but declined amlodipine. According to him B/p is well controlled since used to be in the 180's prior to taking medications. - continue to monitor B/p notify provider if > 140/90  - advised to take ASA 81 mg tablet  daily samples given. EXP 08/2023 and 11/2023  - lisinopril (ZESTRIL) 10 MG tablet; Take 1 tablet (10 mg total) by mouth daily.  Dispense: 90 tablet; Refill: 3 - metoprolol succinate (TOPROL-XL) 25 MG 24 hr tablet; Take 1 tablet (25 mg total) by mouth daily.  Dispense: 90 tablet; Refill: 0 - TSH; Future - COMPLETE METABOLIC PANEL WITH GFR; Future - CBC with Differential/Platelet; Future  2. Mixed hyperlipidemia LDL not at goal  - dietary modification advised  - continue on atorvastatin  - Lipid panel; Future  3. Gastroesophageal reflux disease without esophagitis OTC medication effective  H/H stable.No tarry or black stool  - advised to avoid eating meals late in the evening and to avoid aggravating foods and spices.  4. Prediabetes Dietary modification and exercise discussed at length  Lab Results  Component Value Date   HGBA1C 5.8 (H) 10/30/2022   - Hemoglobin A1c; Future  Family/ staff Communication: Reviewed plan of care with patient verbalized understanding   Labs/tests ordered:  - TSH; Future - COMPLETE METABOLIC PANEL WITH GFR; Future - CBC with Differential/Platelet; Future - Lipid panel; Future  Next Appointment : Return in about 6 months (around 10/31/2023) for medical mangement of chronic issues., Fasting labs in the morning.   Brandon Bookman, NP

## 2023-04-30 NOTE — Patient Instructions (Signed)
-   Advised to check Blood pressure at home and record on log provided and notify provider if B/p > 140/90   

## 2023-05-02 ENCOUNTER — Other Ambulatory Visit: Payer: Self-pay

## 2023-05-14 ENCOUNTER — Other Ambulatory Visit: Payer: Self-pay

## 2023-05-14 DIAGNOSIS — B20 Human immunodeficiency virus [HIV] disease: Secondary | ICD-10-CM

## 2023-05-14 MED ORDER — ODEFSEY 200-25-25 MG PO TABS
1.0000 | ORAL_TABLET | Freq: Every day | ORAL | 0 refills | Status: DC
Start: 2023-05-14 — End: 2023-07-25

## 2023-05-23 ENCOUNTER — Ambulatory Visit: Payer: Self-pay | Admitting: Internal Medicine

## 2023-06-18 ENCOUNTER — Other Ambulatory Visit: Payer: Self-pay | Admitting: Family

## 2023-06-18 DIAGNOSIS — E782 Mixed hyperlipidemia: Secondary | ICD-10-CM

## 2023-06-20 ENCOUNTER — Telehealth: Payer: Self-pay | Admitting: *Deleted

## 2023-06-20 NOTE — Telephone Encounter (Signed)
Patient called and stated that he needed a refill on his NORVASC 5 mg.   The current medication list states that patient is not taking the Norvasc but patient stated that you took him off of Hydralazine and replaced it with NORVASC.   Is it ok to update that current medication list and take out Hydralazine and Add Norvasc as TAKING and send in a Rx to pharmacy.   Please Advise.

## 2023-06-20 NOTE — Telephone Encounter (Signed)
Patient reported on last visit that he was not taking Norvasc because he thought it was too much medication.see last visit note.Please verify all medication that he is taking and the ones he is not taking then send to pharmacy.

## 2023-06-23 MED ORDER — AMLODIPINE BESYLATE 5 MG PO TABS: 5.0000 mg | ORAL_TABLET | Freq: Every day | ORAL | 0 refills | Status: DC

## 2023-06-23 NOTE — Telephone Encounter (Signed)
What dose of Norvasc is he taking and we will send in Also lets make follow up with dinah to check bp in office and review log next week since he is taking different medication.

## 2023-06-23 NOTE — Telephone Encounter (Signed)
I explained to patient what the note states and he agreed with the note but stated that Carilyn Goodpasture took him off of the Hydralazine and placed him on Norvasc. Patient stated that he Has been Taking Norvasc NOT Hydralazine. Patient Has taken his blood pressure and states that it has been doing better than before. (Specific number can't be given)  Patient stated that the Norvasc is working well and he wants to continue the Norvasc.   Please Advise.

## 2023-06-23 NOTE — Telephone Encounter (Signed)
Patient stated that you took him off of Hydralazine and replaced it with Norvasc. Patient stating that he is currently taking Norvasc and needs a Rx sent to his pharmacy.

## 2023-06-23 NOTE — Telephone Encounter (Signed)
Norvasc 5mg  daily and he will call back to make an appointment.

## 2023-06-23 NOTE — Telephone Encounter (Signed)
Per last note he was on hydralazine and did not want to add norvasc to regimen.  What is he currently taking a what is his blood pressure after taking medication

## 2023-06-23 NOTE — Telephone Encounter (Signed)
Order sent.

## 2023-07-11 ENCOUNTER — Other Ambulatory Visit: Payer: Self-pay | Admitting: Family

## 2023-07-11 DIAGNOSIS — I10 Essential (primary) hypertension: Secondary | ICD-10-CM

## 2023-07-14 ENCOUNTER — Telehealth: Payer: Self-pay

## 2023-07-14 DIAGNOSIS — I10 Essential (primary) hypertension: Secondary | ICD-10-CM

## 2023-07-14 MED ORDER — AMLODIPINE BESYLATE 5 MG PO TABS: 5.0000 mg | ORAL_TABLET | Freq: Every day | ORAL | 1 refills | Status: DC

## 2023-07-14 MED ORDER — METOPROLOL SUCCINATE ER 25 MG PO TB24: 25.0000 mg | ORAL_TABLET | Freq: Every day | ORAL | 1 refills | Status: DC

## 2023-07-14 NOTE — Telephone Encounter (Signed)
Patient called and requested a refill on amlodipine and metoprolol. Refill request was send into pharmacy.

## 2023-07-25 ENCOUNTER — Ambulatory Visit: Payer: 59

## 2023-07-25 ENCOUNTER — Other Ambulatory Visit (HOSPITAL_COMMUNITY): Payer: Self-pay

## 2023-07-25 ENCOUNTER — Ambulatory Visit (INDEPENDENT_AMBULATORY_CARE_PROVIDER_SITE_OTHER): Payer: 59 | Admitting: Internal Medicine

## 2023-07-25 ENCOUNTER — Other Ambulatory Visit: Payer: Self-pay

## 2023-07-25 ENCOUNTER — Encounter: Payer: Self-pay | Admitting: Internal Medicine

## 2023-07-25 VITALS — BP 160/79 | HR 80 | Temp 98.3°F | Ht 69.0 in | Wt 195.0 lb

## 2023-07-25 DIAGNOSIS — F17210 Nicotine dependence, cigarettes, uncomplicated: Secondary | ICD-10-CM

## 2023-07-25 DIAGNOSIS — B20 Human immunodeficiency virus [HIV] disease: Secondary | ICD-10-CM | POA: Diagnosis not present

## 2023-07-25 DIAGNOSIS — Z113 Encounter for screening for infections with a predominantly sexual mode of transmission: Secondary | ICD-10-CM | POA: Diagnosis not present

## 2023-07-25 DIAGNOSIS — I10 Essential (primary) hypertension: Secondary | ICD-10-CM

## 2023-07-25 DIAGNOSIS — Z720 Tobacco use: Secondary | ICD-10-CM

## 2023-07-25 MED ORDER — ODEFSEY 200-25-25 MG PO TABS
1.0000 | ORAL_TABLET | Freq: Every day | ORAL | 11 refills | Status: DC
Start: 2023-07-25 — End: 2023-11-24

## 2023-07-25 NOTE — Assessment & Plan Note (Signed)
Will screen 

## 2023-07-25 NOTE — Assessment & Plan Note (Signed)
He continues to do well on [place] and previous labs reviewed with him.  No changes indicated and he can rtc in 11 months Refills provided

## 2023-07-25 NOTE — Assessment & Plan Note (Signed)
BP was high and he was advised to quit smoking, continue monitoring

## 2023-07-25 NOTE — Assessment & Plan Note (Signed)
Advised to quit.  

## 2023-07-25 NOTE — Progress Notes (Signed)
Subjective:    Patient ID: Brandon Quinn, male    DOB: 09/12/1966, 57 y.o.   MRN: [id number]  HPI Brandon Quinn is here for follow up of HIV He has previously been followed by Dr. Orvan Falconer and has been on [facility].  He does not miss any doses.  No issues with getting or taking his medication. No complaints.      Review of Systems  Constitutional:  Negative for fatigue.  Gastrointestinal:  Negative for diarrhea.  Skin:  Negative for rash.       Objective:   Physical Exam Eyes:     General: No scleral icterus. Pulmonary:     Effort: Pulmonary effort is normal.  Neurological:     Mental Status: He is alert.   SH: + tobacco        Assessment & Plan:

## 2023-07-26 LAB — C. TRACHOMATIS/N. GONORRHOEAE RNA
C. trachomatis RNA, TMA: NOT DETECTED
N. gonorrhoeae RNA, TMA: NOT DETECTED

## 2023-07-28 LAB — HIV-1 RNA QUANT-NO REFLEX-BLD
HIV 1 RNA Quant: NOT DETECTED {copies}/mL
HIV-1 RNA Quant, Log: NOT DETECTED {Log_copies}/mL

## 2023-07-28 LAB — RPR: RPR Ser Ql: NONREACTIVE

## 2023-07-28 LAB — T-HELPER CELLS (CD4) COUNT (NOT AT ARMC)
Absolute CD4: 552 {cells}/uL (ref 490–1740)
CD4 T Helper %: 21 % — ABNORMAL LOW (ref 30–61)
Total lymphocyte count: 2656 {cells}/uL (ref 850–3900)

## 2023-08-11 ENCOUNTER — Other Ambulatory Visit: Payer: Self-pay | Admitting: *Deleted

## 2023-08-11 DIAGNOSIS — E782 Mixed hyperlipidemia: Secondary | ICD-10-CM

## 2023-08-11 DIAGNOSIS — I10 Essential (primary) hypertension: Secondary | ICD-10-CM

## 2023-08-11 MED ORDER — AMLODIPINE BESYLATE 5 MG PO TABS: 5.0000 mg | ORAL_TABLET | Freq: Every day | ORAL | 1 refills | Status: DC

## 2023-08-11 MED ORDER — METOPROLOL SUCCINATE ER 25 MG PO TB24: 25.0000 mg | ORAL_TABLET | Freq: Every day | ORAL | 1 refills | Status: DC

## 2023-08-11 MED ORDER — LISINOPRIL 10 MG PO TABS: 10.0000 mg | ORAL_TABLET | Freq: Every day | ORAL | 3 refills | Status: DC

## 2023-08-11 NOTE — Telephone Encounter (Signed)
Is it ok to Remove the Hydralazine and Atorvastatin.  Patient states that he is NOT taking those and it is causing confusion at his pharmacy.   Please Advise.

## 2023-08-11 NOTE — Telephone Encounter (Signed)
Patient called and stated that he is NOT taking Atorvastatin and Hydralazine and wants those REMOVED from his medication list.   Patient stated that we are messing up his refills and he ONLY takes 3 medications from our office.   He Requested those 3 refills to be sent to his pharmacy and Requesting the other 2 to be removed.   Please Advise.

## 2023-08-12 MED ORDER — ATORVASTATIN CALCIUM 40 MG PO TABS: 40.0000 mg | ORAL_TABLET | Freq: Every day | ORAL | 1 refills | Status: DC

## 2023-08-12 NOTE — Telephone Encounter (Signed)
Atorvastatin was started by Cardiologist.Recommend follow up with cardiologist for medication review due to history of high cholesterol,smoking and previous chest pain to prevent heart attack and stroke.

## 2023-08-12 NOTE — Telephone Encounter (Signed)
Patient notified and stated that he wants to continue the Atorvastatin and wants a Rx called to his pharmacy.   Pended Rx and sent to [facility] for approval.
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
## 2023-10-06 ENCOUNTER — Other Ambulatory Visit (HOSPITAL_COMMUNITY): Payer: Self-pay

## 2023-10-11 ENCOUNTER — Emergency Department (HOSPITAL_BASED_OUTPATIENT_CLINIC_OR_DEPARTMENT_OTHER): Payer: 59

## 2023-10-11 ENCOUNTER — Emergency Department (HOSPITAL_BASED_OUTPATIENT_CLINIC_OR_DEPARTMENT_OTHER)
Admission: EM | Admit: 2023-10-11 | Discharge: 2023-10-11 | Disposition: A | Discharge status: Home / Self Care | Payer: 59 | Attending: Emergency Medicine | Admitting: Emergency Medicine

## 2023-10-11 ENCOUNTER — Other Ambulatory Visit: Payer: Self-pay

## 2023-10-11 ENCOUNTER — Encounter (HOSPITAL_BASED_OUTPATIENT_CLINIC_OR_DEPARTMENT_OTHER): Payer: Self-pay

## 2023-10-11 DIAGNOSIS — I7090 Unspecified atherosclerosis: Secondary | ICD-10-CM | POA: Insufficient documentation

## 2023-10-11 DIAGNOSIS — R0789 Other chest pain: Secondary | ICD-10-CM | POA: Diagnosis not present

## 2023-10-11 DIAGNOSIS — Z20822 Contact with and (suspected) exposure to covid-19: Secondary | ICD-10-CM | POA: Diagnosis not present

## 2023-10-11 DIAGNOSIS — R051 Acute cough: Secondary | ICD-10-CM | POA: Diagnosis not present

## 2023-10-11 DIAGNOSIS — Z21 Asymptomatic human immunodeficiency virus [HIV] infection status: Secondary | ICD-10-CM | POA: Insufficient documentation

## 2023-10-11 DIAGNOSIS — Z79899 Other long term (current) drug therapy: Secondary | ICD-10-CM | POA: Insufficient documentation

## 2023-10-11 DIAGNOSIS — I517 Cardiomegaly: Secondary | ICD-10-CM | POA: Diagnosis not present

## 2023-10-11 DIAGNOSIS — I2510 Atherosclerotic heart disease of native coronary artery without angina pectoris: Secondary | ICD-10-CM | POA: Diagnosis not present

## 2023-10-11 DIAGNOSIS — I10 Essential (primary) hypertension: Secondary | ICD-10-CM | POA: Diagnosis not present

## 2023-10-11 DIAGNOSIS — R059 Cough, unspecified: Secondary | ICD-10-CM | POA: Diagnosis not present

## 2023-10-11 LAB — COMPREHENSIVE METABOLIC PANEL
ALT: 17 U/L (ref 0–44)
AST: 30 U/L (ref 15–41)
Albumin: 3.3 g/dL — ABNORMAL LOW (ref 3.5–5.0)
Alkaline Phosphatase: 91 U/L (ref 38–126)
Anion gap: 10 (ref 5–15)
BUN: 7 mg/dL (ref 6–20)
CO2: 23 mmol/L (ref 22–32)
Calcium: 8.6 mg/dL — ABNORMAL LOW (ref 8.9–10.3)
Chloride: 103 mmol/L (ref 98–111)
Creatinine, Ser: 0.89 mg/dL (ref 0.61–1.24)
GFR, Estimated: >60 mL/min (ref >60)
Glucose, Bld: 114 mg/dL — ABNORMAL HIGH (ref 70–99)
Potassium: 4.1 mmol/L (ref 3.5–5.1)
Sodium: 136 mmol/L (ref 135–145)
Total Bilirubin: 0.7 mg/dL (ref 0.0–1.2)
Total Protein: 7.1 g/dL (ref 6.5–8.1)

## 2023-10-11 LAB — CBC WITH DIFFERENTIAL/PLATELET
Abs Immature Granulocytes: 0.01 10*3/uL (ref 0.00–0.07)
Basophils Absolute: 0 10*3/uL (ref 0.0–0.1)
Basophils Relative: 1 %
Eosinophils Absolute: 0.2 10*3/uL (ref 0.0–0.5)
Eosinophils Relative: 4 %
HCT: 40.1 % (ref 39.0–52.0)
Hemoglobin: 13.6 g/dL (ref 13.0–17.0)
Immature Granulocytes: 0 %
Lymphocytes Relative: 30 %
Lymphs Abs: 1.6 10*3/uL (ref 0.7–4.0)
MCH: 30.4 pg (ref 26.0–34.0)
MCHC: 33.9 g/dL (ref 30.0–36.0)
MCV: 89.7 fL (ref 80.0–100.0)
Monocytes Absolute: 0.7 10*3/uL (ref 0.1–1.0)
Monocytes Relative: 13 %
Neutro Abs: 2.8 10*3/uL (ref 1.7–7.7)
Neutrophils Relative %: 52 %
Platelets: 227 10*3/uL (ref 150–400)
RBC: 4.47 MIL/uL (ref 4.22–5.81)
RDW: 12.7 % (ref 11.5–15.5)
WBC: 5.4 10*3/uL (ref 4.0–10.5)
nRBC: 0 % (ref 0.0–0.2)

## 2023-10-11 LAB — URINALYSIS, ROUTINE W REFLEX MICROSCOPIC
Bilirubin Urine: NEGATIVE
Glucose, UA: NEGATIVE mg/dL
Hgb urine dipstick: NEGATIVE
Ketones, ur: NEGATIVE mg/dL
Leukocytes,Ua: NEGATIVE
Nitrite: NEGATIVE
Protein, ur: NEGATIVE mg/dL
Specific Gravity, Urine: 1.015 (ref 1.005–1.030)
pH: 7 (ref 5.0–8.0)

## 2023-10-11 LAB — TROPONIN I (HIGH SENSITIVITY)
Troponin I (High Sensitivity): 55 ng/L — ABNORMAL HIGH (ref <18)
Troponin I (High Sensitivity): 49 ng/L — ABNORMAL HIGH (ref <18)

## 2023-10-11 LAB — RESP PANEL BY RT-PCR (RSV, FLU A&B, COVID)  RVPGX2
Influenza A by PCR: NEGATIVE
Influenza B by PCR: NEGATIVE
Resp Syncytial Virus by PCR: NEGATIVE
SARS Coronavirus 2 by RT PCR: NEGATIVE

## 2023-10-11 LAB — D-DIMER, QUANTITATIVE: D-Dimer, Quant: 0.69 ug{FEU}/mL — ABNORMAL HIGH (ref 0.00–0.50)

## 2023-10-11 LAB — LIPASE, BLOOD: Lipase: 35 U/L (ref 11–51)

## 2023-10-11 MED ORDER — BENZONATATE 100 MG PO CAPS
200.0000 mg | ORAL_CAPSULE | Freq: Once | ORAL | Status: AC
Start: 2023-10-11 — End: 2023-10-11
  Administered 2023-10-11: 200 mg via ORAL
  Filled 2023-10-11: qty 2

## 2023-10-11 MED ORDER — IOHEXOL 350 MG/ML SOLN
75.0000 mL | Freq: Once | INTRAVENOUS | Status: AC | PRN
  Administered 2023-10-11: 75 mL via INTRAVENOUS

## 2023-10-11 MED ORDER — BENZONATATE 100 MG PO CAPS: 100.0000 mg | ORAL_CAPSULE | Freq: Three times a day (TID) | ORAL | 0 refills | Status: DC | PRN

## 2023-10-11 NOTE — ED Notes (Signed)
 Reviewed discharge instructions and follow up with pt. Pt states understanding

## 2023-10-11 NOTE — ED Provider Notes (Signed)
 [place] EMERG[facility] DEPARTMENT AT [facility] Provider Note   CSN: [id number] Arrival date & time: 10/11/23  0130     History  Chief Complaint  Patient presents with   Cough    Brandon Quinn is a 58 y.o. male.  The history is provided by the patient.  Cough Brandon Quinn is a 58 y.o. male who presents to the Emergency Department complaining of cough.  He presents to the emergency department for evaluation of 4 days of cough productive of mucus that is nonbloody.  He also complains of bilateral chest pain with cough as well as deep breaths.  Pain is greater on the right compared to the left.  Chest pain has been there for 1 week.  No fever, sore throat, runny nose, abdominal pain, diarrhea.  He does have some nausea.   Hx/o HTN, HIV controlled on meds.  No hx/o blood clots.  No sick contacts.        Home Medications Prior to Admission medications   Medication Sig Start Date End Date Taking? Authorizing Provider  benzonatate  (TESSALON ) 100 MG capsule Take 1 capsule (100 mg total) by mouth 3 (three) times daily as needed for cough. 10/11/23  Yes Griselda Norris, MD  amLODipine  (NORVASC ) 5 MG tablet Take 1 tablet (5 mg total) by mouth daily. 08/11/23   Ngetich, Dinah C, NP  atorvastatin  (LIPITOR) 40 MG tablet Take 1 tablet (40 mg total) by mouth daily. 08/12/23   Ngetich, Dinah C, NP  emtricitabine -rilpivir-tenofovir  AF (ODEFSEY ) 200-25-25 MG TABS tablet Take 1 tablet by mouth daily. 07/25/23   Comer, Lamar ORN, MD  lisinopril  (ZESTRIL ) 10 MG tablet Take 1 tablet (10 mg total) by mouth daily. 08/11/23   Ngetich, Dinah C, NP  metoprolol  succinate (TOPROL -XL) 25 MG 24 hr tablet Take 1 tablet (25 mg total) by mouth daily. 08/11/23   Ngetich, Dinah C, NP      Allergies    Patient has no known allergies.    Review of Systems   Review of Systems  Respiratory:  Positive for cough.   All other systems reviewed and are negative.   Physical Exam Updated Vital Signs BP (!) 140/54    Pulse 60   Temp 98.8 F (37.1 C) (Oral)   Resp 18   Ht 5' 9 (1.753 m)   Wt 88.5 kg   SpO2 97%   BMI 28.81 kg/m  Physical Exam Vitals and nursing note reviewed.  Constitutional:      Appearance: He is well-developed.  HENT:     Head: Normocephalic and atraumatic.  Cardiovascular:     Rate and Rhythm: Normal rate and regular rhythm.     Heart sounds: No murmur heard. Pulmonary:     Effort: Pulmonary effort is normal. No respiratory distress.     Breath sounds: Normal breath sounds.  Abdominal:     Palpations: Abdomen is soft.     Tenderness: There is no abdominal tenderness. There is no guarding or rebound.  Musculoskeletal:        General: No swelling or tenderness.     Comments: 2+ DP pulses bilaterally  Skin:    General: Skin is warm and dry.  Neurological:     Mental Status: He is alert and oriented to person, place, and time.  Psychiatric:        Behavior: Behavior normal.     ED Results / Procedures / Treatments   Labs (all labs ordered are listed, but only abnormal results are displayed)  Labs Reviewed  COMPREHENSIVE METABOLIC PANEL - Abnormal; Notable for the following components:      Result Value   Glucose, Bld 114 (*)    Calcium  8.6 (*)    Albumin 3.3 (*)    All other components within normal limits  D-DIMER, QUANTITATIVE - Abnormal; Notable for the following components:   D-Dimer, Quant 0.69 (*)    All other components within normal limits  TROPONIN I (HIGH SENSITIVITY) - Abnormal; Notable for the following components:   Troponin I (High Sensitivity) 55 (*)    All other components within normal limits  TROPONIN I (HIGH SENSITIVITY) - Abnormal; Notable for the following components:   Troponin I (High Sensitivity) 49 (*)    All other components within normal limits  RESP PANEL BY RT-PCR (RSV, FLU A&B, COVID)  [id number]  URINALYSIS, ROUTINE W REFLEX MICROSCOPIC  LIPASE, BLOOD  CBC WITH DIFFERENTIAL/PLATELET  CBC WITH DIFFERENTIAL/PLATELET     EKG None  Radiology CT Angio Chest PE W/Cm &/Or Wo Cm Result Date: 10/11/2023 CLINICAL DATA:  58 year old male with history of low back pain and cough. EXAM: CT ANGIOGRAPHY CHEST WITH CONTRAST TECHNIQUE: Multidetector CT imaging of the chest was performed using the standard protocol during bolus administration of intravenous contrast. Multiplanar CT image reconstructions and MIPs were obtained to evaluate the vascular anatomy. RADIATION DOSE REDUCTION: This exam was performed according to the departmental dose-optimization program which includes automated exposure control, adjustment of the mA and/or kV according to patient size and/or use of iterative reconstruction technique. CONTRAST:  75mL OMNIPAQUE  IOHEXOL  350 MG/ML SOLN COMPARISON:  Chest CT 03/11/2022. FINDINGS: Cardiovascular: No filling defects are noted within the pulmonary arterial tree to suggest pulmonary embolism. Heart size is borderline enlarged with concentric left ventricular hypertrophy. There is no significant pericardial fluid, thickening or pericardial calcification. There is aortic atherosclerosis, as well as atherosclerosis of the great vessels of the mediastinum and the coronary arteries, including calcified atherosclerotic plaque in the left main and left anterior descending coronary arteries. Mediastinum/Nodes: No pathologically enlarged mediastinal or hilar lymph nodes. Small hiatal hernia. No axillary lymphadenopathy. Lungs/Pleura: No acute consolidative airspace disease. No pleural effusions. No suspicious appearing pulmonary nodules or masses are noted. Upper Abdomen: Aortic atherosclerosis. Musculoskeletal: There are no aggressive appearing lytic or blastic lesions noted in the visualized portions of the skeleton. Review of the MIP images confirms the above findings. IMPRESSION: 1. No acute findings are noted in the thorax to account for the patient's symptoms. Specifically, no evidence of pulmonary embolism. 2. Aortic  atherosclerosis, in addition to left main and left anterior descending coronary artery disease. Please note that although the presence of coronary artery calcium  documents the presence of coronary artery disease, the severity of this disease and any potential stenosis cannot be assessed on this non-gated CT examination. Assessment for potential risk factor modification, dietary therapy or pharmacologic therapy may be warranted, if clinically indicated. 3. Concentric left ventricular hypertrophy. Aortic Atherosclerosis (ICD10-I70.0). Electronically Signed   By: Toribio Aye M.D.   On: 10/11/2023 06:50   DG Chest 2 View Result Date: 10/11/2023 CLINICAL DATA:  Cough EXAM: CHEST - 2 VIEW COMPARISON:  03/11/2022 FINDINGS: Heart and mediastinal contours are within normal limits. No focal opacities or effusions. No acute bony abnormality. IMPRESSION: No active cardiopulmonary disease. Electronically Signed   By: Franky Crease M.D.   On: 10/11/2023 02:12    Procedures Procedures    Medications Ordered in ED Medications  benzonatate  (TESSALON ) capsule 200 mg (200 mg Oral Given  10/11/23 0451)  iohexol  (OMNIPAQUE ) 350 MG/ML injection 75 mL (75 mLs Intravenous Contrast Given 10/11/23 0608)    ED Course/ Medical Decision Making/ A&P                                 Medical Decision Making Amount and/or Complexity of Data Reviewed Labs: ordered. Radiology: ordered.  Risk Prescription drug management.   Patient with history of hypertension, HIV here for evaluation of chest pain, pain on breathing and cough for several days.  No associated fever.  Lungs are clear on examination without any respiratory distress.  EKG is stable when compared to priors.  Troponins are minimally elevated and flat, similar when compared to priors in the system.  He is negative for COVID, flu, RSV.  D-dimer was obtained given his symptoms and was elevated.  A CTA was obtained, which is negative for PE or pneumonia.  Discussed  with patient incidental finding of atherosclerosis.  Overall suspect his symptoms are secondary to upper respiratory infection.  Discussed with patient home care for viral URI.  Discussed OTC analgesics.  Discussed outpatient follow-up as well as return precautions.        Final Clinical Impression(s) / ED Diagnoses Final diagnoses:  Acute cough  Atypical chest pain  Atherosclerosis    Rx / DC Orders ED Discharge Orders          Ordered    benzonatate  (TESSALON ) 100 MG capsule  3 times daily PRN        10/11/23 0706              Griselda Norris, MD 10/11/23 [phone]

## 2023-10-11 NOTE — ED Triage Notes (Signed)
 Pt POV from home d/t cough for 4 days - clear productive sputum.  Pt also c/o lower back pain bilat but denies dysuria of any type. Pt also c/o of "nerve pain" on sole of right foot.

## 2023-10-22 ENCOUNTER — Ambulatory Visit (INDEPENDENT_AMBULATORY_CARE_PROVIDER_SITE_OTHER): Payer: Self-pay | Admitting: Sports Medicine

## 2023-10-22 ENCOUNTER — Encounter: Payer: Self-pay | Admitting: Sports Medicine

## 2023-10-22 VITALS — BP 137/87 | HR 81 | Temp 97.1°F | Resp 19 | Ht 69.0 in | Wt 193.8 lb

## 2023-10-22 DIAGNOSIS — M109 Gout, unspecified: Secondary | ICD-10-CM

## 2023-10-22 DIAGNOSIS — R051 Acute cough: Secondary | ICD-10-CM

## 2023-10-22 DIAGNOSIS — Z21 Asymptomatic human immunodeficiency virus [HIV] infection status: Secondary | ICD-10-CM

## 2023-10-22 MED ORDER — DOXYCYCLINE HYCLATE 100 MG PO TABS: 100.0000 mg | ORAL_TABLET | Freq: Two times a day (BID) | ORAL | 0 refills | Status: DC

## 2023-10-22 MED ORDER — ALLOPURINOL 100 MG PO TABS: 100.0000 mg | ORAL_TABLET | Freq: Every day | ORAL | 1 refills | Status: DC

## 2023-10-22 MED ORDER — GUAIFENESIN ER 600 MG PO TB12: 1200.0000 mg | ORAL_TABLET | Freq: Two times a day (BID) | ORAL | 0 refills | Status: AC

## 2023-10-22 MED ORDER — PREDNISONE 20 MG PO TABS: 40.0000 mg | ORAL_TABLET | Freq: Every day | ORAL | 0 refills | Status: DC

## 2023-10-22 NOTE — Progress Notes (Signed)
 Careteam: Patient Care Team: Ngetich, Elijio Guadeloupe, NP as PCP - General (Family Medicine) [place], Peter M, MD as PCP - Cardiology (Cardiology) Cory Dingwall, MD (Inactive) as Consulting Physician (Infectious Diseases)  PLACE OF SERVICE:  [facility]  Advanced Directive information    No Known Allergies  No chief complaint on file.  Discussed the use of AI scribe software for clinical note transcription with the patient, who gave verbal consent to proceed.  The patient, with a known history of HIV and gout, presented with a persistent cough that began on January 4th. The cough was severe enough to prompt an ER visit, where he underwent extensive testing including a chest x-ray and scan, and tests for COVID and flu. All results returned negative. Despite being prescribed Tessalon , the patient reported no improvement in his cough, which is productive with clear phlegm. The patient noted some improvement in his condition on the day of the consultation.  In addition to the cough, the patient reported a sudden onset of left foot pain and swelling that began on January 5th. The pain, rated as a 9 on a scale of 1 to 10, was particularly severe at night, disrupting his sleep. The patient has a history of gout and has experienced three flare-ups in the past year. He was not on any preventative medication for gout at the time of the consultation, and reported that ibuprofen  was ineffective in managing his pain.  The patient also reported a discomfort in his lower back, which he attributed to his persistent cough. He denied any other symptoms such as sore throat, sinus congestion, ear pain, or abdominal pain. He also denied any recent falls or injuries to his foot.  Review of Systems:  Review of Systems  Constitutional:  Negative for chills and fever.  HENT:  Negative for congestion and sore throat.   Eyes:  Negative for double vision.  Respiratory:  Positive for sputum production. Negative for cough  and shortness of breath.   Cardiovascular:  Negative for chest pain, palpitations and leg swelling.  Gastrointestinal:  Negative for abdominal pain, heartburn and nausea.  Genitourinary:  Negative for dysuria, frequency and hematuria.  Musculoskeletal:  Positive for joint pain. Negative for falls and myalgias.  Neurological:  Negative for dizziness, sensory change and focal weakness.   Negative unless indicated in HPI.   Past Medical History:  Diagnosis Date   Gout    HIV infection (HCC)    Hyperlipidemia    Hypertension    Substance abuse (HCC)    alcohol    Tobacco abuse    Past Surgical History:  Procedure Laterality Date   WRIST FRACTURE SURGERY  ~ 2009   right; "put a plate in it" (10/96/0454)   Social History:   reports that he has been smoking cigarettes. He has a 7.8 pack-year smoking history. He has never used smokeless tobacco. He reports current alcohol use. He reports that he does not use drugs.  Family History  Problem Relation Age of Onset   Hypertension Sister    Hypertension Brother    Other Other        No known heart disease    Medications: Patient's Medications  New Prescriptions   No medications on file  Previous Medications   AMLODIPINE  (NORVASC ) 5 MG TABLET    Take 1 tablet (5 mg total) by mouth daily.   ATORVASTATIN  (LIPITOR) 40 MG TABLET    Take 1 tablet (40 mg total) by mouth daily.   BENZONATATE  (TESSALON )  100 MG CAPSULE    Take 1 capsule (100 mg total) by mouth 3 (three) times daily as needed for cough.   EMTRICITABINE -RILPIVIR-TENOFOVIR  AF (ODEFSEY ) 200-25-25 MG TABS TABLET    Take 1 tablet by mouth daily.   LISINOPRIL  (ZESTRIL ) 10 MG TABLET    Take 1 tablet (10 mg total) by mouth daily.   METOPROLOL  SUCCINATE (TOPROL -XL) 25 MG 24 HR TABLET    Take 1 tablet (25 mg total) by mouth daily.  Modified Medications   No medications on file  Discontinued Medications   No medications on file    Physical Exam: There were no vitals filed for this  visit. There is no height or weight on file to calculate BMI. BP Readings from Last 3 Encounters:  10/11/23 (!) 172/76  07/25/23 (!) 160/79  04/30/23 120/66   Wt Readings from Last 3 Encounters:  10/11/23 195 lb 1.7 oz (88.5 kg)  07/25/23 195 lb (88.5 kg)  04/30/23 194 lb (88 kg)    Physical Exam Constitutional:      Appearance: Normal appearance.  HENT:     Head: Normocephalic and atraumatic.  Cardiovascular:     Rate and Rhythm: Normal rate and regular rhythm.     Pulses: Normal pulses.     Heart sounds: Normal heart sounds.  Pulmonary:     Effort: No respiratory distress.     Breath sounds: No stridor. No wheezing or rales.  Abdominal:     General: Bowel sounds are normal. There is no distension.     Palpations: Abdomen is soft.     Tenderness: There is no abdominal tenderness. There is no right CVA tenderness or guarding.  Musculoskeletal:        General: No swelling.     Comments: Left great toe- warm, swollen, tender to palpation. Rom restricted due to pain.  Neurological:     Mental Status: He is alert. Mental status is at baseline.     Sensory: No sensory deficit.     Motor: No weakness.     Labs reviewed: Basic Metabolic Panel: Recent Labs    10/30/22 0836 10/11/23 0436  NA 138 136  K 4.2 4.1  CL 102 103  CO2 25 23  GLUCOSE 100* 114*  BUN 16 7  CREATININE 0.91 0.89  CALCIUM  9.5 8.6*  TSH 1.23  --    Liver Function Tests: Recent Labs    10/30/22 0836 10/11/23 0436  AST 18 30  ALT 11 17  ALKPHOS  --  91  BILITOT 0.5 0.7  PROT 7.1 7.1  ALBUMIN  --  3.3*   Recent Labs    10/11/23 0436  LIPASE 35   No results for input(s): "AMMONIA" in the last 8760 hours. CBC: Recent Labs    10/30/22 0836 10/11/23 0436  WBC 7.9 5.4  NEUTROABS 3,705 2.8  HGB 13.6 13.6  HCT 39.6 40.1  MCV 91.9 89.7  PLT 238 227   Lipid Panel: Recent Labs    10/30/22 0836  CHOL 219*  HDL 66  LDLCALC 136*  TRIG 71  CHOLHDL 3.3   TSH: Recent Labs     10/30/22 0836  TSH 1.23   A1C: Lab Results  Component Value Date   HGBA1C 5.8 (H) 10/30/2022     Assessment/Plan  Acute Cough Persistent cough since 10/11/2023. Negative for COVID, flu, and pneumonia. Chest X-ray and CT scan showed no infection or clot. Clear sputum. No associated sore throat, sinus congestion, or ear pain. Cough improved today.  Will start mucinex  Will prescribe doxycyline , pt with h/o HIV -Advise salt water gargling.  Gout Flare Left foot pain and swelling since 10/12/2023. History of gout with three flare-ups in the past year. Pain level 9/10, worse at night. -Prescribe Prednisone  40mg  daily for five days. Will start  Allopurinol  to prevent future attacks. -Advise to avoid alcohol and shellfish, and to drink plenty of water. -Check uric acid levels.  HIV Follow-up with infectious disease specialist. -Keep appointment on 11/03/2023.  Work Status Patient concerned about missing work due to cough. -Provide note for today's clinic visit.    No follow-ups on file.:

## 2023-10-23 LAB — C-REACTIVE PROTEIN: CRP: 63.8 mg/L — ABNORMAL HIGH (ref <8.0)

## 2023-10-23 LAB — SEDIMENTATION RATE: Sed Rate: 109 mm/h — ABNORMAL HIGH (ref 0–20)

## 2023-10-23 LAB — URIC ACID: Uric Acid, Serum: 7.1 mg/dL (ref 4.0–8.0)

## 2023-11-03 ENCOUNTER — Encounter: Payer: Self-pay | Admitting: Family

## 2023-11-03 NOTE — Progress Notes (Signed)
This encounter was created in error - please disregard. No show

## 2023-11-06 ENCOUNTER — Telehealth: Payer: Self-pay

## 2023-11-06 NOTE — Telephone Encounter (Signed)
May send medication to [facility] at [address] on [address] for medication payment assistance program to prevent missing medication which can result in very high blood pressure.

## 2023-11-06 NOTE — Telephone Encounter (Signed)
Patient states the he has no insurance at this time. He states he can afford the Lisinopril and metoprolol but not the amlodipine because it will be over 300.00

## 2023-11-13 ENCOUNTER — Other Ambulatory Visit: Payer: Self-pay

## 2023-11-13 MED ORDER — AMLODIPINE BESYLATE 5 MG PO TABS
5.0000 mg | ORAL_TABLET | Freq: Every day | ORAL | 1 refills | Status: DC
  Filled 2023-11-13: qty 30, 30d supply, fill #0

## 2023-11-13 NOTE — Telephone Encounter (Signed)
 Medication has been sent.

## 2023-11-24 ENCOUNTER — Telehealth: Payer: Self-pay

## 2023-11-24 DIAGNOSIS — B20 Human immunodeficiency virus [HIV] disease: Secondary | ICD-10-CM

## 2023-11-24 MED ORDER — ODEFSEY 200-25-25 MG PO TABS: 1.0000 | ORAL_TABLET | Freq: Every day | ORAL | 5 refills | Status: DC

## 2023-11-24 NOTE — Telephone Encounter (Signed)
 Patient left voicemail stating he's been unable to get his medication and pharmacy told him it's not covered. Patient's has ICAP. Per financial counselor, patient needs to use [facility].  New Rx sent.   Sandie Ano, RN

## 2023-11-26 ENCOUNTER — Telehealth: Payer: Self-pay

## 2023-11-26 ENCOUNTER — Other Ambulatory Visit (HOSPITAL_COMMUNITY): Payer: Self-pay

## 2023-11-26 NOTE — Telephone Encounter (Signed)
 [facility] Patient Advocate Encounter   Received notification from [facility] that prior authorization for Brandon Quinn is required.   PA submitted on 11/26/23 Key [id number] Status is pending    [facility] will continue to follow.   Clearance Coots, CPhT Specialty Pharmacy Patient [facility] for Infectious Disease Phone: [phone] Fax:  [phone]

## 2023-11-27 ENCOUNTER — Other Ambulatory Visit (HOSPITAL_COMMUNITY): Payer: Self-pay

## 2023-11-27 ENCOUNTER — Telehealth: Payer: Self-pay

## 2023-11-27 NOTE — Telephone Encounter (Signed)
 [facility] Patient Advocate Encounter  Prior Authorization for ODEFSEY has been approved.    PA# [id number] Effective dates: 11/26/2023 through 11/24/2024  Patients co-pay is $3700.00.   [facility] will continue to follow.  Clearance Coots, CPhT Specialty Pharmacy Patient [facility] for Infectious Disease Phone: [phone] Fax:  [phone]

## 2024-01-05 ENCOUNTER — Other Ambulatory Visit: Payer: Self-pay | Admitting: Internal Medicine

## 2024-01-05 DIAGNOSIS — B20 Human immunodeficiency virus [HIV] disease: Secondary | ICD-10-CM

## 2024-01-06 ENCOUNTER — Other Ambulatory Visit: Payer: Self-pay

## 2024-01-06 DIAGNOSIS — B20 Human immunodeficiency virus [HIV] disease: Secondary | ICD-10-CM

## 2024-01-06 MED ORDER — ODEFSEY 200-25-25 MG PO TABS: 1.0000 | ORAL_TABLET | Freq: Every day | ORAL | 5 refills | Status: DC

## 2024-01-08 ENCOUNTER — Emergency Department (HOSPITAL_BASED_OUTPATIENT_CLINIC_OR_DEPARTMENT_OTHER)
Admission: EM | Admit: 2024-01-08 | Discharge: 2024-01-08 | Disposition: A | Discharge status: Home / Self Care | Payer: Self-pay | Attending: Emergency Medicine | Admitting: Emergency Medicine

## 2024-01-08 ENCOUNTER — Emergency Department (HOSPITAL_BASED_OUTPATIENT_CLINIC_OR_DEPARTMENT_OTHER): Payer: Self-pay

## 2024-01-08 ENCOUNTER — Other Ambulatory Visit: Payer: Self-pay

## 2024-01-08 ENCOUNTER — Encounter (HOSPITAL_BASED_OUTPATIENT_CLINIC_OR_DEPARTMENT_OTHER): Payer: Self-pay | Admitting: Emergency Medicine

## 2024-01-08 DIAGNOSIS — R531 Weakness: Secondary | ICD-10-CM | POA: Insufficient documentation

## 2024-01-08 DIAGNOSIS — Z79899 Other long term (current) drug therapy: Secondary | ICD-10-CM | POA: Insufficient documentation

## 2024-01-08 DIAGNOSIS — Z21 Asymptomatic human immunodeficiency virus [HIV] infection status: Secondary | ICD-10-CM | POA: Insufficient documentation

## 2024-01-08 DIAGNOSIS — E876 Hypokalemia: Secondary | ICD-10-CM | POA: Insufficient documentation

## 2024-01-08 DIAGNOSIS — M542 Cervicalgia: Secondary | ICD-10-CM | POA: Insufficient documentation

## 2024-01-08 DIAGNOSIS — I10 Essential (primary) hypertension: Secondary | ICD-10-CM | POA: Insufficient documentation

## 2024-01-08 LAB — CBC WITH DIFFERENTIAL/PLATELET
Abs Immature Granulocytes: 0.03 10*3/uL (ref 0.00–0.07)
Basophils Absolute: 0.1 10*3/uL (ref 0.0–0.1)
Basophils Relative: 1 %
Eosinophils Absolute: 0.2 10*3/uL (ref 0.0–0.5)
Eosinophils Relative: 2 %
HCT: 42 % (ref 39.0–52.0)
Hemoglobin: 14.2 g/dL (ref 13.0–17.0)
Immature Granulocytes: 0 %
Lymphocytes Relative: 34 %
Lymphs Abs: 3.8 10*3/uL (ref 0.7–4.0)
MCH: 30.4 pg (ref 26.0–34.0)
MCHC: 33.8 g/dL (ref 30.0–36.0)
MCV: 89.9 fL (ref 80.0–100.0)
Monocytes Absolute: 0.9 10*3/uL (ref 0.1–1.0)
Monocytes Relative: 8 %
Neutro Abs: 6.2 10*3/uL (ref 1.7–7.7)
Neutrophils Relative %: 55 %
Platelets: 274 10*3/uL (ref 150–400)
RBC: 4.67 MIL/uL (ref 4.22–5.81)
RDW: 13.9 % (ref 11.5–15.5)
WBC: 11.3 10*3/uL — ABNORMAL HIGH (ref 4.0–10.5)
nRBC: 0 % (ref 0.0–0.2)

## 2024-01-08 LAB — COMPREHENSIVE METABOLIC PANEL WITH GFR
ALT: 14 U/L (ref 0–44)
AST: 23 U/L (ref 15–41)
Albumin: 3.7 g/dL (ref 3.5–5.0)
Alkaline Phosphatase: 94 U/L (ref 38–126)
Anion gap: 10 (ref 5–15)
BUN: 11 mg/dL (ref 6–20)
CO2: 23 mmol/L (ref 22–32)
Calcium: 8.6 mg/dL — ABNORMAL LOW (ref 8.9–10.3)
Chloride: 100 mmol/L (ref 98–111)
Creatinine, Ser: 0.92 mg/dL (ref 0.61–1.24)
GFR, Estimated: >60 mL/min (ref >60)
Glucose, Bld: 123 mg/dL — ABNORMAL HIGH (ref 70–99)
Potassium: 3.4 mmol/L — ABNORMAL LOW (ref 3.5–5.1)
Sodium: 133 mmol/L — ABNORMAL LOW (ref 135–145)
Total Bilirubin: 0.6 mg/dL (ref 0.0–1.2)
Total Protein: 8.2 g/dL — ABNORMAL HIGH (ref 6.5–8.1)

## 2024-01-08 LAB — TROPONIN I (HIGH SENSITIVITY)
Troponin I (High Sensitivity): 42 ng/L — ABNORMAL HIGH (ref <18)
Troponin I (High Sensitivity): 51 ng/L — ABNORMAL HIGH (ref <18)

## 2024-01-08 MED ORDER — MORPHINE SULFATE (PF) 4 MG/ML IV SOLN
4.0000 mg | Freq: Once | INTRAVENOUS | Status: AC
  Administered 2024-01-08: 4 mg via INTRAVENOUS
  Filled 2024-01-08: qty 1

## 2024-01-08 MED ORDER — IOHEXOL 350 MG/ML SOLN
75.0000 mL | Freq: Once | INTRAVENOUS | Status: AC | PRN
  Administered 2024-01-08: 75 mL via INTRAVENOUS

## 2024-01-08 MED ORDER — CYCLOBENZAPRINE HCL 5 MG PO TABS: 5.0000 mg | ORAL_TABLET | Freq: Three times a day (TID) | ORAL | 0 refills | Status: DC | PRN

## 2024-01-08 MED ORDER — IBUPROFEN 800 MG PO TABS: 800.0000 mg | ORAL_TABLET | Freq: Three times a day (TID) | ORAL | 0 refills | Status: DC | PRN

## 2024-01-08 NOTE — ED Triage Notes (Signed)
 Woke up 2 days ago with neck pain , limited ROM due to pain . No injury . Denies NV , no further symptoms

## 2024-01-08 NOTE — ED Notes (Signed)
 Pt alert and oriented X 4 at the time of discharge. RR even and unlabored. No acute distress noted. Pt verbalized understanding of discharge instructions as discussed. Pt ambulatory to lobby at time of discharge.

## 2024-01-08 NOTE — ED Notes (Addendum)
 Marland Kitchen

## 2024-01-08 NOTE — ED Provider Notes (Signed)
 [facility] Provider Note   CSN: [id number] Arrival date & time: 01/08/24  1615     History  Chief Complaint  Patient presents with   Torticollis    Brandon Quinn is a 58 y.o. male. Patient has a history of HIV last seen by Dr. Luciana Axe on 07/2023, compliant with Odefsey, alcohol use, gout, hypertension.  Patient reports that over the last 2 days, he has had significant neck pain and limited range of motion in the neck.  Denies any trauma to the area, no loss of consciousness, no head injury.  However, he does note a small laceration on the upper right forehead that he had from a bolt from work.  Patient also reports of mild weakness ongoing for 2 days as well as numbness in the right upper and lower extremity.  Patient denies chest pain, shortness of breath.  Feels like his heart is racing, generally compliant with blood pressure medication.     Home Medications Prior to Admission medications   Medication Sig Start Date End Date Taking? Authorizing Provider  cyclobenzaprine (FLEXERIL) 5 MG tablet Take 1 tablet (5 mg total) by mouth 3 (three) times daily as needed for muscle spasms. 01/08/24  Yes Alfredo Martinez, MD  ibuprofen (ADVIL) 800 MG tablet Take 1 tablet (800 mg total) by mouth every 8 (eight) hours as needed. 01/08/24  Yes Alfredo Martinez, MD  allopurinol (ZYLOPRIM) 100 MG tablet Take 1 tablet (100 mg total) by mouth daily. 10/22/23   Venita Sheffield, MD  amLODipine (NORVASC) 5 MG tablet Take 1 tablet (5 mg total) by mouth daily. 11/13/23   Ngetich, Dinah C, NP  atorvastatin (LIPITOR) 40 MG tablet Take 1 tablet (40 mg total) by mouth daily. 08/12/23   Ngetich, Dinah C, NP  benzonatate (TESSALON) 100 MG capsule Take 1 capsule (100 mg total) by mouth 3 (three) times daily as needed for cough. 10/11/23   Tilden Fossa, MD  doxycycline (VIBRA-TABS) 100 MG tablet Take 1 tablet (100 mg total) by mouth 2 (two) times daily. 10/22/23   Venita Sheffield, MD  emtricitabine-rilpivir-tenofovir AF (ODEFSEY) 200-25-25 MG TABS tablet Take 1 tablet by mouth daily. 01/06/24   Comer, Belia Heman, MD  lisinopril (ZESTRIL) 10 MG tablet Take 1 tablet (10 mg total) by mouth daily. 08/11/23   Ngetich, Dinah C, NP  metoprolol succinate (TOPROL-XL) 25 MG 24 hr tablet Take 1 tablet (25 mg total) by mouth daily. 08/11/23   Ngetich, Dinah C, NP  predniSONE (DELTASONE) 20 MG tablet Take 2 tablets (40 mg total) by mouth daily with breakfast. 10/22/23   Venita Sheffield, MD      Allergies    Patient has no known allergies.    Review of Systems   Review of Systems  Constitutional:  Negative for activity change and appetite change.  HENT:  Negative for congestion and sore throat.   Respiratory:  Negative for cough.   Cardiovascular:  Negative for chest pain, palpitations and leg swelling.  Gastrointestinal:  Negative for abdominal pain, constipation, diarrhea, nausea and vomiting.  Musculoskeletal:  Positive for neck pain and neck stiffness. Negative for myalgias.    Physical Exam Updated Vital Signs BP (!) 160/72   Pulse 68   Temp 97.9 F (36.6 C) (Oral)   Resp 20   Wt 90.7 kg   SpO2 99%   BMI 29.53 kg/m  Physical Exam Constitutional:      Appearance: Normal appearance.  HENT:     Head:  Normocephalic and atraumatic.     Nose: Nose normal.     Mouth/Throat:     Mouth: Mucous membranes are moist.  Eyes:     Extraocular Movements: Extraocular movements intact.     Conjunctiva/sclera: Conjunctivae normal.     Pupils: Pupils are equal, round, and reactive to light.  Neck:     Comments: Severely limited ROM with no ability to extend and resting in flexed position. Unable to rotate  Cardiovascular:     Rate and Rhythm: Normal rate and regular rhythm.  Pulmonary:     Effort: Pulmonary effort is normal.     Breath sounds: Normal breath sounds.  Abdominal:     General: Abdomen is flat. Bowel sounds are normal.     Palpations: Abdomen is  soft.  Musculoskeletal:     Cervical back: Rigidity and tenderness present.  Neurological:     Mental Status: He is alert and oriented to person, place, and time.     Motor: Weakness (4/5 weakness biceps RUE and RLE with 4/5 strength) present.  Psychiatric:        Mood and Affect: Mood normal.        Behavior: Behavior normal.        Thought Content: Thought content normal.        Judgment: Judgment normal.     ED Results / Procedures / Treatments   Labs (all labs ordered are listed, but only abnormal results are displayed) Labs Reviewed  CBC WITH DIFFERENTIAL/PLATELET - Abnormal; Notable for the following components:      Result Value   WBC 11.3 (*)    All other components within normal limits  COMPREHENSIVE METABOLIC PANEL WITH GFR - Abnormal; Notable for the following components:   Sodium 133 (*)    Potassium 3.4 (*)    Glucose, Bld 123 (*)    Calcium 8.6 (*)    Total Protein 8.2 (*)    All other components within normal limits  TROPONIN I (HIGH SENSITIVITY) - Abnormal; Notable for the following components:   Troponin I (High Sensitivity) 42 (*)    All other components within normal limits  TROPONIN I (HIGH SENSITIVITY) - Abnormal; Notable for the following components:   Troponin I (High Sensitivity) 51 (*)    All other components within normal limits    EKG None  Radiology CT ANGIO HEAD NECK W WO CM Result Date: 01/08/2024 CLINICAL DATA:  Neuro deficit, acute, stroke suspected. Neck pain for 2 days. Torticollis. EXAM: CT ANGIOGRAPHY HEAD AND NECK WITH AND WITHOUT CONTRAST TECHNIQUE: Multidetector CT imaging of the head and neck was performed using the standard protocol during bolus administration of intravenous contrast. Multiplanar CT image reconstructions and MIPs were obtained to evaluate the vascular anatomy. Carotid stenosis measurements (when applicable) are obtained utilizing NASCET criteria, using the distal internal carotid diameter as the denominator. RADIATION  DOSE REDUCTION: This exam was performed according to the departmental dose-optimization program which includes automated exposure control, adjustment of the mA and/or kV according to patient size and/or use of iterative reconstruction technique. CONTRAST:  75mL OMNIPAQUE IOHEXOL 350 MG/ML SOLN COMPARISON:  Head CT 04/21/2022. Head MRI 08/09/2020. Neck CT 01/29/2015. FINDINGS: CT HEAD FINDINGS Brain: There is no evidence of an acute infarct, intracranial hemorrhage, mass, midline shift, or extra-axial fluid collection. There is a background of mild chronic small vessel ischemia in the cerebral white matter. Chronic infarcts are again noted in the left corona radiata and left basal ganglia. A cyst in the atrium  of the left lateral ventricle is unchanged. Vascular: No hyperdense vessel. Skull: No fracture or suspicious lesion. Sinuses/Orbits: Unchanged large right frontal sinus osteoma. No acute inflammatory sinus disease. Clear mastoid air cells. Unremarkable orbits. Other: None. Review of the MIP images confirms the above findings CTA NECK FINDINGS Aortic arch: Standard branching. Mild atherosclerosis. No significant stenosis of the arch vessel origins. Right carotid system: Patent with soft and calcified plaque in the mid to distal common carotid artery and at the carotid bifurcation and carotid bulb. No evidence of a significant stenosis or dissection. Left carotid system: Patent with scattered calcified and soft plaque throughout the common carotid artery as well as at the carotid bifurcation and carotid bulb. No evidence of a significant stenosis or dissection. Vertebral arteries: Patent and codominant. Calcified plaque at the right vertebral artery origin result in moderate to severe stenosis. Skeleton: Mild cervical spondylosis. Moderate to severe facet arthrosis on the right at C2-3 and bilaterally at C7-T1 and in the upper thoracic spine. Other neck: No evidence of cervical lymphadenopathy or mass. Upper  chest: Clear lung apices. Review of the MIP images confirms the above findings CTA HEAD FINDINGS Arterial assessment is limited by suboptimal opacification/contrast timing. Anterior circulation: The internal carotid arteries are patent from skull base to carotid termini with calcified plaque resulting in moderate cavernous stenoses bilaterally. ACAs and MCAs are patent proximally, however of optimal contrast timing limits detailed assessment, particularly of the branch vessels. The appearance of severe bilateral M2 and A2 stenoses may reflect a combination of true atherosclerotic narrowing and artifact arm poor arterial opacification. No aneurysm is identified. Posterior circulation: The intracranial vertebral arteries are widely patent to the basilar. The basilar artery is widely patent. Posterior communicating arteries are diminutive or absent. The PCAs are patent proximally with suboptimal contrast timing limiting accurate assessment for stenosis. No aneurysm is identified. Venous sinuses: Patent. Anatomic variants: None. Review of the MIP images confirms the above findings IMPRESSION: 1. No evidence of acute intracranial abnormality. 2. Chronic left basal ganglia and corona radiata infarcts. 3. Intracranial atherosclerosis including moderate bilateral cavernous ICA stenoses. Poor contrast timing limits accurate assessment for ACA, MCA, and PCA stenoses. 4. Moderate to severe right vertebral artery origin stenosis. 5. Cervical carotid atherosclerosis without a significant stenosis or dissection. 6.  Aortic Atherosclerosis (ICD10-I70.0). Electronically Signed   By: Sebastian Ache M.D.   On: 01/08/2024 19:21   DG Chest Portable 1 View Result Date: 01/08/2024 CLINICAL DATA:  Chest pain EXAM: PORTABLE CHEST 1 VIEW COMPARISON:  X-ray 10/11/2023 and CTA.  Older exams as well FINDINGS: Hyperinflation. No consolidation, pneumothorax or effusion. No edema. Normal cardiopericardial silhouette. Overlapping cardiac leads.  Degenerative changes of the spine. IMPRESSION: Hyperinflation.  No acute cardiopulmonary disease. Electronically Signed   By: Karen Kays M.D.   On: 01/08/2024 18:12    Procedures Procedures    Medications Ordered in ED Medications  morphine (PF) 4 MG/ML injection 4 mg (4 mg Intravenous Given 01/08/24 1656)  iohexol (OMNIPAQUE) 350 MG/ML injection 75 mL (75 mLs Intravenous Contrast Given 01/08/24 1809)    ED Course/ Medical Decision Making/ A&P Clinical Course as of 01/08/24 2000  Thu Jan 08, 2024  1937 Stable, resident case I was called out to triage emergently for possible stroke evaluation.  Moderate right-sided upper and lower extremity weakness compared to left side.  He also has severe neck pain complete and flexibility.  Denies fevers chills nausea vomiting shortness of breath.  States he woke up with it 2  days ago.  States he has a history of similar that this is more severe. Will evaluate for vascular injury, CVA, intracranial hemorrhage with CT angiography head and neck while treating his pain plan for reassessment.  [CC]  1938 Reassessment: On reassessment he is endorsing complete symptomatic resolution. Interestingly, the angiography has old infarcts identified in his left basal ganglia. Patient endorses that he has recurrence of right-sided weakness anytime his blood pressure is high which it was high earlier on his arrival though it has returned to normal at this point. Likely that his weakness was recurrence of his old stroke in the setting of hypertension though new stroke cannot be ruled out based on his presentation. We discussed transfer to tertiary care center for MRI and neurologic evaluation and patient ultimately declined in favor for outpatient care. [CC]    Clinical Course User Index [CC] Glyn Ade, MD                                 Medical Decision Making Amount and/or Complexity of Data Reviewed Labs: ordered. Radiology:  ordered.  Risk Prescription drug management.   Medical Decision Making:   Brandon Quinn is a 58 y.o. male who presented to the ED today with neck pain and stiffness detailed above.    Patient's presentation is complicated by their history of HIV although controlled.  Patient placed on continuous vitals and telemetry monitoring while in ED which was reviewed periodically.  Complete initial physical exam performed, notably the patient was experiencing neck pain and significant neck stiffness preventing him from extending the neck.    Reviewed and confirmed nursing documentation for past medical history, family history, social history.    Initial Assessment:   With the patient's presentation of neck stiffness and pain given his vague history, will maintain broad differential.  Differential includes infectious etiology including tetanus, meningitis, cryptococcus.  Differential also includes MSK related pain, arterial dissection, stroke although would be unusual symptoms.  Question fracture or bony abnormality although patient denies any trauma to the area.  No new medications or bug/animal bites to patients knowledge.   This is most consistent with an acute complicated illness  Initial Plan:   Screening labs including CBC and Metabolic panel to evaluate for infectious or metabolic etiology of disease. CBC and CMP overall unremarkable.  Trop 42>51 but baseline 49 per previous chart review. No evidence of chest pain CXR to evaluate for structural/infectious intrathoracic pathology.  CTA head and neck to rule out artery dissection vs new CVA/TIA vs other possible intra-cranial pathologies Objective evaluation as below reviewed   Initial Study Results:   Laboratory  All laboratory results reviewed without evidence of clinically relevant pathology.   Exceptions include: slight hypokalemia, WBC 11.3  Radiology:  All images reviewed independently. Agree with radiology report at this time.   CT  ANGIO HEAD NECK W WO CM Result Date: 01/08/2024 CLINICAL DATA:  Neuro deficit, acute, stroke suspected. Neck pain for 2 days. Torticollis. EXAM: CT ANGIOGRAPHY HEAD AND NECK WITH AND WITHOUT CONTRAST TECHNIQUE: Multidetector CT imaging of the head and neck was performed using the standard protocol during bolus administration of intravenous contrast. Multiplanar CT image reconstructions and MIPs were obtained to evaluate the vascular anatomy. Carotid stenosis measurements (when applicable) are obtained utilizing NASCET criteria, using the distal internal carotid diameter as the denominator. RADIATION DOSE REDUCTION: This exam was performed according to the departmental dose-optimization program which includes automated  exposure control, adjustment of the mA and/or kV according to patient size and/or use of iterative reconstruction technique. CONTRAST:  75mL OMNIPAQUE IOHEXOL 350 MG/ML SOLN COMPARISON:  Head CT 04/21/2022. Head MRI 08/09/2020. Neck CT 01/29/2015. FINDINGS: CT HEAD FINDINGS Brain: There is no evidence of an acute infarct, intracranial hemorrhage, mass, midline shift, or extra-axial fluid collection. There is a background of mild chronic small vessel ischemia in the cerebral white matter. Chronic infarcts are again noted in the left corona radiata and left basal ganglia. A cyst in the atrium of the left lateral ventricle is unchanged. Vascular: No hyperdense vessel. Skull: No fracture or suspicious lesion. Sinuses/Orbits: Unchanged large right frontal sinus osteoma. No acute inflammatory sinus disease. Clear mastoid air cells. Unremarkable orbits. Other: None. Review of the MIP images confirms the above findings CTA NECK FINDINGS Aortic arch: Standard branching. Mild atherosclerosis. No significant stenosis of the arch vessel origins. Right carotid system: Patent with soft and calcified plaque in the mid to distal common carotid artery and at the carotid bifurcation and carotid bulb. No evidence of a  significant stenosis or dissection. Left carotid system: Patent with scattered calcified and soft plaque throughout the common carotid artery as well as at the carotid bifurcation and carotid bulb. No evidence of a significant stenosis or dissection. Vertebral arteries: Patent and codominant. Calcified plaque at the right vertebral artery origin result in moderate to severe stenosis. Skeleton: Mild cervical spondylosis. Moderate to severe facet arthrosis on the right at C2-3 and bilaterally at C7-T1 and in the upper thoracic spine. Other neck: No evidence of cervical lymphadenopathy or mass. Upper chest: Clear lung apices. Review of the MIP images confirms the above findings CTA HEAD FINDINGS Arterial assessment is limited by suboptimal opacification/contrast timing. Anterior circulation: The internal carotid arteries are patent from skull base to carotid termini with calcified plaque resulting in moderate cavernous stenoses bilaterally. ACAs and MCAs are patent proximally, however of optimal contrast timing limits detailed assessment, particularly of the branch vessels. The appearance of severe bilateral M2 and A2 stenoses may reflect a combination of true atherosclerotic narrowing and artifact arm poor arterial opacification. No aneurysm is identified. Posterior circulation: The intracranial vertebral arteries are widely patent to the basilar. The basilar artery is widely patent. Posterior communicating arteries are diminutive or absent. The PCAs are patent proximally with suboptimal contrast timing limiting accurate assessment for stenosis. No aneurysm is identified. Venous sinuses: Patent. Anatomic variants: None. Review of the MIP images confirms the above findings IMPRESSION: 1. No evidence of acute intracranial abnormality. 2. Chronic left basal ganglia and corona radiata infarcts. 3. Intracranial atherosclerosis including moderate bilateral cavernous ICA stenoses. Poor contrast timing limits accurate  assessment for ACA, MCA, and PCA stenoses. 4. Moderate to severe right vertebral artery origin stenosis. 5. Cervical carotid atherosclerosis without a significant stenosis or dissection. 6.  Aortic Atherosclerosis (ICD10-I70.0). Electronically Signed   By: Sebastian Ache M.D.   On: 01/08/2024 19:21   DG Chest Portable 1 View Result Date: 01/08/2024 CLINICAL DATA:  Chest pain EXAM: PORTABLE CHEST 1 VIEW COMPARISON:  X-ray 10/11/2023 and CTA.  Older exams as well FINDINGS: Hyperinflation. No consolidation, pneumothorax or effusion. No edema. Normal cardiopericardial silhouette. Overlapping cardiac leads. Degenerative changes of the spine. IMPRESSION: Hyperinflation.  No acute cardiopulmonary disease. Electronically Signed   By: Karen Kays M.D.   On: 01/08/2024 18:12     Final Assessment and Plan:   Suspect MSK Neck Pain: As previously noted, patient's neck stiffness and pain resolved after  1 dose of morphine.  On re-evaluation, he had completely full range of motion of the neck without complaint.  Additionally, he had 5 out of 5 strength of the right upper and lower extremity on repeat neuroexam.  Patient also reported that he had recurrence of weakness in the right upper and lower extremity often when blood pressure is elevated.  On CT head, he had evidence of chronic infarcts noted which could explain the transient changes of strength via recrudescence secondary to high blood pressure.  Discussed with patient at length the options moving forward.  Because a CT scan showed no acute findings, considered discussing with neurology and possibly sending to another facility to get brain MRI for further assessment.   Because his neuroexam was without focal deficit on repeat and he no longer had pain or limitation in range of motion to the neck, he decided to politely decline the option of MRI/neurology.  Because the patient is medically stable and no longer has deficits, can be seen by neurology outpatient.   Additionally, CTA head and neck showed chronic infarcts but no acute changes. Provided information for neurology for the patient to call.  Also, provided ibuprofen and Flexeril (checked interactions with ART) for pain relief. Strict return precautions provided.    Clinical Impression:  1. Neck pain   2. Primary hypertension   3. Right sided weakness      Discharge          Final Clinical Impression(s) / ED Diagnoses Final diagnoses:  Neck pain  Primary hypertension  Right sided weakness    Rx / DC Orders ED Discharge Orders          Ordered    Ambulatory referral to Neurology       Comments: An appointment is requested in approximately: 2 weeks   01/08/24 1939    cyclobenzaprine (FLEXERIL) 5 MG tablet  3 times daily PRN        01/08/24 1943    ibuprofen (ADVIL) 800 MG tablet  Every 8 hours PRN        01/08/24 1943              Alfredo Martinez, MD 01/08/24 Maryjean Morn, MD 01/08/24 2252

## 2024-01-08 NOTE — Discharge Instructions (Addendum)
 Please see your pcp for blood pressure follow up   Take tylenol at home for pain control as well.   Flexeril and Ibuprofen sent to pharmacy   Return if you experience symptoms again

## 2024-02-03 ENCOUNTER — Other Ambulatory Visit (HOSPITAL_COMMUNITY): Payer: Self-pay

## 2024-02-04 ENCOUNTER — Ambulatory Visit: Payer: Self-pay

## 2024-02-04 ENCOUNTER — Other Ambulatory Visit: Payer: Self-pay | Admitting: Family

## 2024-02-04 ENCOUNTER — Other Ambulatory Visit: Payer: Self-pay

## 2024-02-04 DIAGNOSIS — E782 Mixed hyperlipidemia: Secondary | ICD-10-CM

## 2024-02-04 DIAGNOSIS — B20 Human immunodeficiency virus [HIV] disease: Secondary | ICD-10-CM

## 2024-02-04 DIAGNOSIS — I10 Essential (primary) hypertension: Secondary | ICD-10-CM

## 2024-02-04 MED ORDER — ODEFSEY 200-25-25 MG PO TABS: 1.0000 | ORAL_TABLET | Freq: Every day | ORAL | 5 refills | Status: AC

## 2024-02-04 NOTE — Telephone Encounter (Signed)
 Copied from CRM [id number]. Topic: Clinical - Medication Refill >> Feb 04, 2024 11:30 AM Corin V wrote: Most Recent Primary Care Visit:  Provider: Tye Gall  Department: [facility]  Visit Type: OFFICE VISIT  Date: 10/22/2023  Medication: atorvastatin  (LIPITOR) 40 MG tablet lisinopril  (ZESTRIL ) 10 MG tablet metoprolol  succinate (TOPROL -XL) 25 MG 24 hr tablet   Has the patient contacted their pharmacy? Yes (Agent: If no, request that the patient contact the pharmacy for the refill. If patient does not wish to contact the pharmacy document the reason why and proceed with request.) (Agent: If yes, when and what did the pharmacy advise?)  Is this the correct pharmacy for this prescription? Yes If no, delete pharmacy and type the correct one.  This is the patient's preferred pharmacy:  [id number] - [place], [place] - [address] AT [facility] [address] & [address] [place] [address] Phone: [phone] Fax: [phone]   Has the prescription been filled recently? No  Is the patient out of the medication? Yes  Has the patient been seen for an appointment in the last year OR does the patient have an upcoming appointment? Yes  Can we respond through MyChart? No  Agent: Please be advised that Rx refills may take up to 3 business days. We ask that you follow-up with your pharmacy.

## 2024-02-04 NOTE — Telephone Encounter (Signed)
  Chief Complaint: HTN Symptoms: R sided numbness, blurry vision Frequency: 2 days Pertinent Negatives: Patient denies fever, CP Disposition: [x] ED /[] Urgent Care (no appt availability in office) / [] Appointment(In office/virtual)/ []  [facility]/ [] Home Care/ [x] Refused Recommended Disposition /[] [place] [facility]/ []  Follow-up with PCP Additional Notes: Pt c/o of HTN, R sided numbness, blurry vision. Pt reports has been out of 3 BP medications x 2 days and has continued to have issues with getting refills. Pt also endorses concern for multiple medications to treat same sx and confusion with medication changes. Of note, pt reports that he is currently outside sitting in the sun "to sweat it out" so he can feel better. He also endorses taking apple cider vinegar. He is currently at a friends house in [place]. Triager strongly advised calling 911 to evaluate his symptoms. Unsure if pt understands and will follow disposition as he was insistent that he needed refills (see below) and did not want to go to ED.   Copied from CRM [id number]. Topic: Clinical - Red Word Triage >> Feb 04, 2024  4:52 PM Corin V wrote: [facility] that prompted transfer to Nurse Triage: patient has been out of blood pressure meds for 2 days and he is having blurry vision, numbness on his right side, and in general does not feel good. He has high blood pressure. Reason for Disposition  [1] Numbness (i.e., loss of sensation) of the face, arm or leg on one side of the body AND [2] new-onset  Answer Assessment - Initial Assessment Questions 1. BLOOD PRESSURE: "What is the blood pressure?" "Did you take at least two measurements 5 minutes apart?"     1700: 200/90 2. ONSET: "When did you take your blood pressure?"     *No Answer* 3. HOW: "How did you take your blood pressure?" (e.g., automatic home BP monitor, visiting nurse)     Auto BP 4. HISTORY: "Do you have a history of high blood pressure?"     yes 5.  MEDICINES: "Are you taking any medicines for blood pressure?" "Have you missed any doses recently?"     Has been out of BP medications x 2 days amLODipine  (NORVASC ) 5 MG tablet lisinopril  (ZESTRIL ) 10 MG tablet And a third medication that he is unsure about  6. OTHER SYMPTOMS: "Do you have any symptoms?" (e.g., blurred vision, chest pain, difficulty breathing, headache, weakness)     Blurry vision, numbness on R side  Protocols used: Blood Pressure - High-A-AH

## 2024-02-05 ENCOUNTER — Emergency Department (HOSPITAL_BASED_OUTPATIENT_CLINIC_OR_DEPARTMENT_OTHER)
Admission: EM | Admit: 2024-02-05 | Discharge: 2024-02-05 | Disposition: A | Discharge status: Home / Self Care | Payer: Self-pay | Attending: Emergency Medicine | Admitting: Emergency Medicine

## 2024-02-05 ENCOUNTER — Other Ambulatory Visit: Payer: Self-pay | Admitting: Family

## 2024-02-05 ENCOUNTER — Encounter (HOSPITAL_BASED_OUTPATIENT_CLINIC_OR_DEPARTMENT_OTHER): Payer: Self-pay | Admitting: Emergency Medicine

## 2024-02-05 ENCOUNTER — Encounter: Payer: Self-pay | Admitting: Adult Health

## 2024-02-05 ENCOUNTER — Ambulatory Visit: Payer: Self-pay | Admitting: Adult Health

## 2024-02-05 VITALS — BP 148/82 | HR 82 | Temp 97.7°F | Ht 71.0 in | Wt 203.6 lb

## 2024-02-05 DIAGNOSIS — I10 Essential (primary) hypertension: Secondary | ICD-10-CM

## 2024-02-05 DIAGNOSIS — T148XXA Other injury of unspecified body region, initial encounter: Secondary | ICD-10-CM

## 2024-02-05 DIAGNOSIS — S86912A Strain of unspecified muscle(s) and tendon(s) at lower leg level, left leg, initial encounter: Secondary | ICD-10-CM | POA: Insufficient documentation

## 2024-02-05 DIAGNOSIS — Z79899 Other long term (current) drug therapy: Secondary | ICD-10-CM | POA: Insufficient documentation

## 2024-02-05 DIAGNOSIS — M25562 Pain in left knee: Secondary | ICD-10-CM

## 2024-02-05 DIAGNOSIS — R030 Elevated blood-pressure reading, without diagnosis of hypertension: Secondary | ICD-10-CM

## 2024-02-05 DIAGNOSIS — F1010 Alcohol abuse, uncomplicated: Secondary | ICD-10-CM

## 2024-02-05 DIAGNOSIS — Z760 Encounter for issue of repeat prescription: Secondary | ICD-10-CM | POA: Insufficient documentation

## 2024-02-05 DIAGNOSIS — Z720 Tobacco use: Secondary | ICD-10-CM

## 2024-02-05 DIAGNOSIS — X58XXXA Exposure to other specified factors, initial encounter: Secondary | ICD-10-CM | POA: Insufficient documentation

## 2024-02-05 DIAGNOSIS — B20 Human immunodeficiency virus [HIV] disease: Secondary | ICD-10-CM

## 2024-02-05 MED ORDER — ACETAMINOPHEN 500 MG PO TABS
1000.0000 mg | ORAL_TABLET | Freq: Once | ORAL | Status: AC
  Administered 2024-02-05: 1000 mg via ORAL
  Filled 2024-02-05: qty 2

## 2024-02-05 MED ORDER — METOPROLOL SUCCINATE ER 25 MG PO TB24: 25.0000 mg | ORAL_TABLET | Freq: Every day | ORAL | 1 refills | Status: DC

## 2024-02-05 MED ORDER — ACETAMINOPHEN 500 MG PO TABS: 1000.0000 mg | ORAL_TABLET | Freq: Three times a day (TID) | ORAL | Status: DC | PRN

## 2024-02-05 MED ORDER — ATORVASTATIN CALCIUM 40 MG PO TABS: 40.0000 mg | ORAL_TABLET | Freq: Every day | ORAL | 1 refills | Status: DC

## 2024-02-05 MED ORDER — METOPROLOL SUCCINATE ER 25 MG PO TB24
25.0000 mg | ORAL_TABLET | ORAL | Status: AC
  Administered 2024-02-05: 25 mg via ORAL
  Filled 2024-02-05: qty 1

## 2024-02-05 MED ORDER — AMLODIPINE BESYLATE 5 MG PO TABS: 5.0000 mg | ORAL_TABLET | Freq: Every day | ORAL | 1 refills | Status: DC

## 2024-02-05 MED ORDER — AMLODIPINE BESYLATE 5 MG PO TABS: 5.0000 mg | ORAL_TABLET | Freq: Every day | ORAL | 0 refills | Status: DC

## 2024-02-05 MED ORDER — AMLODIPINE BESYLATE 5 MG PO TABS
5.0000 mg | ORAL_TABLET | Freq: Once | ORAL | Status: AC
  Administered 2024-02-05: 5 mg via ORAL
  Filled 2024-02-05 (×2): qty 1

## 2024-02-05 MED ORDER — LISINOPRIL 10 MG PO TABS
10.0000 mg | ORAL_TABLET | Freq: Every day | ORAL | 1 refills | Status: DC
Start: 2024-02-05 — End: 2024-03-06

## 2024-02-05 NOTE — Telephone Encounter (Signed)
 Copied from CRM [id number]. Topic: Clinical - Medical Advice >> Feb 05, 2024  1:38 PM Tisa Forester wrote: Reason for CRM: patient went to ER Yesterday due to blood pressure was  240  this morning went to ER blood pressure  193/ was given one dose and having dizzness patient refuse to be triage by nurse triage  the issue patient is having been out of his medication for 3 days  need the following medication : amLODipine  (NORVASC ) 5 MG tablet,metoprolol  succinate (TOPROL -XL) 25 MG 24 hr tablet,lisinopril  (ZESTRIL ) 10 MG tablet

## 2024-02-05 NOTE — Telephone Encounter (Signed)
 Limited supply of medication given and needs follow up with PCP for ongoing refills.

## 2024-02-05 NOTE — ED Provider Notes (Signed)
 [place] [facility] Provider Note   CSN: [id number] Arrival date & time: 02/05/24  0506     History  Chief Complaint  Patient presents with   Hypertension    Brandon Quinn is a 58 y.o. male.  The history is provided by the patient.  Illness Location:  Body Quality:  His BP medication was refilled yesterday but not available as of last night at the pharmacy and he needs a dose of his medication.  also pulled calf muscle coming in. Severity:  Mild Timing:  Constant Progression:  Unchanged Chronicity:  Chronic Context:  Has HTN Relieved by:  Nothing Worsened by:  Nothing Ineffective treatments:  None Associated symptoms: no fatigue, no fever, no headaches, no loss of consciousness, no myalgias, no nausea, no rash, no rhinorrhea, no shortness of breath, no sore throat, no vomiting and no wheezing   Risk factors:  BP      Home Medications Prior to Admission medications   Medication Sig Start Date End Date Taking? Authorizing Provider  allopurinol  (ZYLOPRIM ) 100 MG tablet Take 1 tablet (100 mg total) by mouth daily. 10/22/23   Tye Gall, MD  amLODipine  (NORVASC ) 5 MG tablet Take 1 tablet (5 mg total) by mouth daily. 11/13/23   Ngetich, Dinah C, NP  atorvastatin  (LIPITOR) 40 MG tablet Take 1 tablet (40 mg total) by mouth daily. 08/12/23   Ngetich, Dinah C, NP  benzonatate  (TESSALON ) 100 MG capsule Take 1 capsule (100 mg total) by mouth 3 (three) times daily as needed for cough. 10/11/23   Kelsey Patricia, MD  cyclobenzaprine  (FLEXERIL ) 5 MG tablet Take 1 tablet (5 mg total) by mouth 3 (three) times daily as needed for muscle spasms. 01/08/24   Ernestina Headland, MD  doxycycline  (VIBRA -TABS) 100 MG tablet Take 1 tablet (100 mg total) by mouth 2 (two) times daily. 10/22/23   Tye Gall, MD  emtricitabine -rilpivir-tenofovir  AF (ODEFSEY ) 200-25-25 MG TABS tablet Take 1 tablet by mouth daily. 02/04/24   Vu, Garnette Ka T, MD  ibuprofen  (ADVIL )  800 MG tablet Take 1 tablet (800 mg total) by mouth every 8 (eight) hours as needed. 01/08/24   Ernestina Headland, MD  lisinopril  (ZESTRIL ) 10 MG tablet Take 1 tablet (10 mg total) by mouth daily. 08/11/23   Ngetich, Dinah C, NP  metoprolol  succinate (TOPROL -XL) 25 MG 24 hr tablet Take 1 tablet (25 mg total) by mouth daily. 08/11/23   Ngetich, Dinah C, NP  predniSONE  (DELTASONE ) 20 MG tablet Take 2 tablets (40 mg total) by mouth daily with breakfast. 10/22/23   Tye Gall, MD      Allergies    Patient has no known allergies.    Review of Systems   Review of Systems  Constitutional:  Negative for fatigue and fever.  HENT:  Negative for rhinorrhea and sore throat.   Respiratory:  Negative for shortness of breath and wheezing.   Gastrointestinal:  Negative for nausea and vomiting.  Musculoskeletal:  Negative for myalgias.  Skin:  Negative for rash.  Neurological:  Negative for loss of consciousness and headaches.  All other systems reviewed and are negative.   Physical Exam Updated Vital Signs BP (!) 192/88 (BP Location: Right Arm)   Pulse 95   Temp 98.7 F (37.1 C) (Oral)   Resp 18   Ht 5\' 11"  (1.803 m)   Wt 90.7 kg   SpO2 99%   BMI 27.89 kg/m  Physical Exam Vitals and nursing note reviewed.  Constitutional:  General: He is not in acute distress.    Appearance: Normal appearance. He is well-developed. He is not diaphoretic.  HENT:     Head: Normocephalic and atraumatic.     Nose: Nose normal.  Eyes:     Conjunctiva/sclera: Conjunctivae normal.     Pupils: Pupils are equal, round, and reactive to light.  Cardiovascular:     Rate and Rhythm: Normal rate and regular rhythm.     Pulses: Normal pulses.     Heart sounds: Normal heart sounds.  Pulmonary:     Effort: Pulmonary effort is normal.     Breath sounds: Normal breath sounds. No wheezing or rales.  Abdominal:     General: Bowel sounds are normal.     Palpations: Abdomen is soft.     Tenderness: There is no  abdominal tenderness. There is no guarding or rebound.  Musculoskeletal:        General: Normal range of motion.     Cervical back: Normal range of motion and neck supple.     Left lower leg: Pitting Edema present.     Right ankle: Normal.     Right Achilles Tendon: Normal.     Left ankle: Normal.     Left Achilles Tendon: Normal.     Right foot: Normal.     Left foot: Normal.     Comments: Negative Homan's sign   Skin:    General: Skin is warm and dry.     Capillary Refill: Capillary refill takes less than 2 seconds.  Neurological:     General: No focal deficit present.     Mental Status: He is alert and oriented to person, place, and time.     Deep Tendon Reflexes: Reflexes normal.  Psychiatric:        Mood and Affect: Mood normal.        Behavior: Behavior normal.     ED Results / Procedures / Treatments   Labs (all labs ordered are listed, but only abnormal results are displayed) Labs Reviewed - No data to display  EKG None  Radiology No results found.  Procedures Procedures    Medications Ordered in ED Medications  amLODipine  (NORVASC ) tablet 5 mg (5 mg Oral Given 02/05/24 0536)  acetaminophen  (TYLENOL ) tablet 1,000 mg (1,000 mg Oral Given 02/05/24 0535)  metoprolol  succinate (TOPROL -XL) 24 hr tablet 25 mg (25 mg Oral Given 02/05/24 0536)    ED Course/ Medical Decision Making/ A&P                                 Medical Decision Making Patient with needing a dose of home medications as refill not yet available   Amount and/or Complexity of Data Reviewed External Data Reviewed: notes.    Details: Previous notes reviewed   Risk OTC drugs. Prescription drug management. Risk Details: Meds given in the ED. Contact your pharmacy regarding your refills.  Stable for discharge. Strict returns    Final Clinical Impression(s) / ED Diagnoses Final diagnoses:  Encounter for medication refill  Muscle strain  Elevated blood pressure reading    No signs of  systemic illness or infection. The patient is nontoxic-appearing on exam and vital signs are within normal limits.  I have reviewed the triage vital signs and the nursing notes. Pertinent labs & imaging results that were available during my care of the patient were reviewed by me and considered in my medical decision making (  see chart for details). After history, exam, and medical workup I feel the patient has been appropriately medically screened and is safe for discharge home. Pertinent diagnoses were discussed with the patient. Patient was given return precautions.      Rx / DC Orders ED Discharge Orders     None         Damari Suastegui, MD 02/05/24 [phone]

## 2024-02-05 NOTE — ED Triage Notes (Signed)
 Pt states High blood pressure, has been out of meds for 3 days.

## 2024-02-05 NOTE — Telephone Encounter (Signed)
 The patient is calling back upset, requesting an update on his blood pressure medication.  Please advise.

## 2024-02-05 NOTE — Telephone Encounter (Signed)
 Pt is out of amlodipine , metoprolol , and lisinopril  and went to the ED yesterday. Pt did not want to schedule a HFU visit. RN informed CAL.  Copied from CRM [id number]. Topic: Clinical - Prescription Issue >> Feb 05, 2024 10:59 AM Tisa Forester wrote: Reason for CRM:  3 days out of blood pressure medication   Checking on status of the refills ,  amLODipine  (NORVASC ) 5 MG tablet,metoprolol  succinate (TOPROL -XL) 25 MG 24 hr tablet,lisinopril  (ZESTRIL ) 10 MG tablet  Patient use the [id number] - [place], [place] - [address] AT [facility] [address] [place] [address] Phone: [phone] Fax: [phone]   Patient requesting a call back on status his medication refills  Patient call back number :  [phone] >> Feb 05, 2024 11:36 AM Brynn Caras wrote: The patient states he has been out of his bp medications for 3 days, he needs this signed off to his pharmacy by the end of today. PT has visited the ED for his HTN, he did not want to schedule his ED F/U.

## 2024-02-05 NOTE — Telephone Encounter (Signed)
 Called pharmacy and spoke with Pharmacist and changed Quantity of the Atorvastatin  to #30 with no RF per [facility], NP due to patient needing an appointment.

## 2024-02-05 NOTE — Telephone Encounter (Signed)
 Message routed to [place], NP due to PCP being out of office.

## 2024-02-05 NOTE — Progress Notes (Unsigned)
 [facility] clinic  Provider:  Inge Mangle DNP  Code Status:  Full Code  Goals of Care:     02/06/2024    5:34 AM  Advanced Directives  Does Patient Have a Medical Advance Directive? No  Would patient like information on creating a medical advance directive? No - Patient declined     Chief Complaint  Patient presents with   Medication Refill    Pt is need refills on his mediation pt is needs a refill on his b/p medication he has been with out his medication x3 days     Discussed the use of AI scribe software for clinical note transcription with the patient, who gave verbal consent to proceed.  HPI: Patient is a 58 y.o. male seen today for an acute visit for medication refill.  He has been without his blood pressure medications for three days, resulting in significantly elevated blood pressure readings, including  SBP 240 mmHg last night, 193 mmHg this morning at the hospital, and 148/82 mmHg during the visit. He is concerned about the lack of medication leading to a similar situation that caused a 'light stroke' in the past. He is currently prescribed lisinopril  10 mg daily, amlodipine  5 mg daily, and metoprolol  succinate 25 mg daily. He received metoprolol  and Tylenol  at the emergency room today, 02/05/24.  He also experiences left knee pain due to a misstep on the stairs. He was prescribed ibuprofen  800 mg every eight hours as needed. He visited the emergency room today for this issue, where he was given ice for the swelling and ibuprofen  for pain management. He notes edema in the left lower leg and is using a cane for mobility.  He smokes half a pack of cigarettes daily and consumes an average of three cans of alcohol daily. He acknowledges that these habits contribute to his blood pressure issues. No swelling except for edema in the left lower leg.        Past Medical History:  Diagnosis Date   Gout    HIV infection (HCC)    Hyperlipidemia    Hypertension    Substance  abuse (HCC)    alcohol    Tobacco abuse     Past Surgical History:  Procedure Laterality Date   WRIST FRACTURE SURGERY  ~ 2009   right; "put a plate in it" (16/07/9603)    No Known Allergies  Outpatient Encounter Medications as of 02/05/2024  Medication Sig   acetaminophen  (TYLENOL ) 500 MG tablet Take 2 tablets (1,000 mg total) by mouth every 8 (eight) hours as needed. (Patient taking differently: Take 1,000 mg by mouth every 8 (eight) hours as needed for moderate pain (pain score 4-6) or mild pain (pain score 1-3).)   [DISCONTINUED] amLODipine  (NORVASC ) 5 MG tablet Take 1 tablet (5 mg total) by mouth daily. NEEDS APPT WITH PCP for additional refills.   [DISCONTINUED] lisinopril  (ZESTRIL ) 10 MG tablet Take 1 tablet (10 mg total) by mouth daily. NEEDS APPT WITH PCP for additional refills.   [DISCONTINUED] metoprolol  succinate (TOPROL -XL) 25 MG 24 hr tablet Take 1 tablet (25 mg total) by mouth daily.   amLODipine  (NORVASC ) 5 MG tablet Take 1 tablet (5 mg total) by mouth daily. NEEDS APPT WITH PCP for additional refills.   emtricitabine -rilpivir-tenofovir  AF (ODEFSEY ) 200-25-25 MG TABS tablet Take 1 tablet by mouth daily.   ibuprofen  (ADVIL ) 800 MG tablet Take 1 tablet (800 mg total) by mouth every 8 (eight) hours as needed. (Patient not taking: Reported on 02/05/2024)  lisinopril  (ZESTRIL ) 10 MG tablet Take 1 tablet (10 mg total) by mouth daily. NEEDS APPT WITH PCP for additional refills.   metoprolol  succinate (TOPROL -XL) 25 MG 24 hr tablet Take 1 tablet (25 mg total) by mouth daily.   [DISCONTINUED] allopurinol  (ZYLOPRIM ) 100 MG tablet Take 1 tablet (100 mg total) by mouth daily.   [DISCONTINUED] atorvastatin  (LIPITOR) 40 MG tablet Take 1 tablet (40 mg total) by mouth daily. (Patient not taking: Reported on 02/05/2024)   [DISCONTINUED] benzonatate  (TESSALON ) 100 MG capsule Take 1 capsule (100 mg total) by mouth 3 (three) times daily as needed for cough.   [DISCONTINUED] cyclobenzaprine   (FLEXERIL ) 5 MG tablet Take 1 tablet (5 mg total) by mouth 3 (three) times daily as needed for muscle spasms.   [DISCONTINUED] doxycycline  (VIBRA -TABS) 100 MG tablet Take 1 tablet (100 mg total) by mouth 2 (two) times daily.   [DISCONTINUED] predniSONE  (DELTASONE ) 20 MG tablet Take 2 tablets (40 mg total) by mouth daily with breakfast.   No facility-administered encounter medications on file as of 02/05/2024.    Review of Systems:  Review of Systems  Constitutional:  Negative for activity change, appetite change and fever.  HENT:  Negative for sore throat.   Eyes: Negative.   Cardiovascular:  Negative for chest pain and leg swelling.  Gastrointestinal:  Negative for abdominal distention, diarrhea and vomiting.  Genitourinary:  Negative for dysuria, frequency and urgency.  Skin:  Negative for color change.  Neurological:  Negative for dizziness and headaches.  Psychiatric/Behavioral:  Negative for behavioral problems and sleep disturbance. The patient is not nervous/anxious.     Health Maintenance  Topic Date Due   COVID-19 Vaccine (1) Never done   DTaP/Tdap/Td (1 - Tdap) Never done   Zoster Vaccines- Shingrix (1 of 2) Never done   Colonoscopy  Never done   Pneumococcal Vaccine 65-46 Years old (2 of 2 - PCV) 04/24/2016   INFLUENZA VACCINE  05/07/2024   Hepatitis C Screening  Completed   HIV Screening  Completed   HPV VACCINES  Aged Out   Meningococcal B Vaccine  Aged Out    Physical Exam: Vitals:   02/05/24 1537  BP: (!) 148/82  Pulse: 82  Temp: 97.7 F (36.5 C)  TempSrc: Temporal  SpO2: 93%  Weight: 203 lb 9.6 oz (92.4 kg)  Height: 5\' 11"  (1.803 m)   Body mass index is 28.4 kg/m. Physical Exam Constitutional:      Appearance: Normal appearance.  HENT:     Head: Normocephalic and atraumatic.     Mouth/Throat:     Mouth: Mucous membranes are moist.  Eyes:     Conjunctiva/sclera: Conjunctivae normal.  Cardiovascular:     Rate and Rhythm: Normal rate and regular  rhythm.     Pulses: Normal pulses.     Heart sounds: Normal heart sounds.  Pulmonary:     Effort: Pulmonary effort is normal.     Breath sounds: Normal breath sounds.  Abdominal:     General: Bowel sounds are normal.     Palpations: Abdomen is soft.  Musculoskeletal:        General: No swelling.     Cervical back: Normal range of motion.     Comments: Left lower leg edema, trace  Skin:    General: Skin is warm and dry.  Neurological:     General: No focal deficit present.     Mental Status: He is alert and oriented to person, place, and time.  Psychiatric:  Mood and Affect: Mood normal.        Behavior: Behavior normal.        Thought Content: Thought content normal.        Judgment: Judgment normal.     Labs reviewed: Basic Metabolic Panel: Recent Labs    10/11/23 0436 01/08/24 1637 02/06/24 0631  NA 136 133* 138  K 4.1 3.4* 4.0  CL 103 100 101  CO2 23 23 22   GLUCOSE 114* 123* 137*  BUN 7 11 8   CREATININE 0.89 0.92 0.93  CALCIUM  8.6* 8.6* 9.4   Liver Function Tests: Recent Labs    10/11/23 0436 01/08/24 1637 02/06/24 0631  AST 30 23 33  ALT 17 14 22   ALKPHOS 91 94 119  BILITOT 0.7 0.6 0.7  PROT 7.1 8.2* 8.0  ALBUMIN 3.3* 3.7 4.2   Recent Labs    10/11/23 0436  LIPASE 35   No results for input(s): "AMMONIA" in the last 8760 hours. CBC: Recent Labs    10/11/23 0436 01/08/24 1637 02/06/24 0631  WBC 5.4 11.3* 9.4  NEUTROABS 2.8 6.2 4.3  HGB 13.6 14.2 14.5  HCT 40.1 42.0 42.1  MCV 89.7 89.9 89.8  PLT 227 274 212   Lipid Panel: No results for input(s): "CHOL", "HDL", "LDLCALC", "TRIG", "CHOLHDL", "LDLDIRECT" in the last 8760 hours. Lab Results  Component Value Date   HGBA1C 5.8 (H) 10/30/2022    Procedures since last visit: MR BRAIN WO CONTRAST Result Date: 02/06/2024 CLINICAL DATA:  58 year old male with neck pain and weakness.  HIV. EXAM: MRI HEAD WITHOUT CONTRAST TECHNIQUE: Multiplanar, multiecho pulse sequences of the brain and  surrounding structures were obtained without intravenous contrast. COMPARISON:  CT head, CTA head and neck this morning. Previous brain MRI 08/09/2020. FINDINGS: The examination had to be discontinued prior to completion by the patient. Axial and coronal DWI imaging obtained. No restricted diffusion or evidence of acute infarction. Axial T2 imaging obtained. Major intracranial vascular flow voids are stable since Mar 06, 2020. Sagittal T1 weighted imaging obtained. Stable cerebral volume since Mar 06, 2020. Negative pituitary, cervicomedullary junction. Negative visible cervical spine. Visualized bone marrow signal is within normal limits. Motion degraded axial FLAIR imaging obtained. Stable ventricle size and configuration. Chronic benign appearing ventricular cyst at the atrium on the left appears unchanged since 2020/03/06. No ventriculomegaly or transependymal edema. Widespread scattered bilateral cerebral white matter T2 and FLAIR hyperintensity is chronic, and generally stable since 2020-03-06, most pronounced at the left corona radiata, chronically extending into the left lentiform. No cortical encephalomalacia is identified. No other deep gray nuclei, no brainstem, or cerebellum signal abnormality identified. IMPRESSION: 1. Truncated exam. No evidence of acute infarct, no acute intracranial abnormality identified. 2. Chronic signal changes most suggestive of chronic small vessel disease, including chronic lacunar infarct from the left corona radiata into the lentiform, stable since 03/06/2020. 3. Stable chronic benign appearing ventricular cyst of the left lateral ventricle. Electronically Signed   By: Marlise Simpers M.D.   On: 02/06/2024 10:09   CT HEAD CODE STROKE WO CONTRAST Addendum Date: 02/06/2024 ADDENDUM REPORT: 02/06/2024 06:44 ADDENDUM: Study discussed by telephone with Dr. Earma Gloss on 02/06/2024 at 0626 hours. Electronically Signed   By: Marlise Simpers M.D.   On: 02/06/2024 06:44   Result Date: 02/06/2024 CLINICAL DATA:  Code stroke.  58 year old male with neurologic deficit. HIV. EXAM: CT HEAD WITHOUT CONTRAST TECHNIQUE: Contiguous axial images were obtained from the base of the skull through the vertex without intravenous contrast. RADIATION DOSE REDUCTION: This exam  was performed according to the departmental dose-optimization program which includes automated exposure control, adjustment of the mA and/or kV according to patient size and/or use of iterative reconstruction technique. COMPARISON:  Brain MRI 08/09/2020.  Head CT 01/08/2024. FINDINGS: Brain: Chronic cysts of the atrium of the left lateral ventricle, which appeared simple on 2021 MRI, size and configuration stable since that time (series 2, image 16). Stable ventricle size and configuration otherwise. No significant ventriculomegaly. Cerebral volume does not appear significantly changed from 2021. No acute intracranial hemorrhage identified. No midline shift, mass effect, or evidence of intracranial mass lesion. Stable patchy scattered white matter hypodensity in both hemispheres from last month. Some deep white matter capsule involvement suspected. No cortically based acute infarct identified. Vascular: No suspicious intracranial vascular hyperdensity. Calcified atherosclerosis at the skull base. Skull: Intact.  No acute osseous abnormality identified. Sinuses/Orbits: Visualized paranasal sinuses and mastoids are stable and well aerated. Large chronic right frontoethmoidal recess osteoma, most likely a normal variant. Other: No gaze deviation. No acute orbit or scalp soft tissue finding. ASPECTS [facility] Stroke Program Early CT Score) Total score (0-10 with 10 being normal): 10 IMPRESSION: 1. No acute cortically based infarct or acute intracranial hemorrhage identified. ASPECTS 10. 2. Stable non contrast CT appearance of the brain from last month. Patchy chronic white matter disease. Chronic benign appearing cyst of the atrium of the left lateral ventricle. Electronically Signed:  By: Marlise Simpers M.D. On: 02/06/2024 06:18   CT ANGIO HEAD NECK W WO CM (CODE STROKE) Result Date: 02/06/2024 CLINICAL DATA:  58 year old male with neck pain and weakness. Similar presentation on 01/08/2024. EXAM: CT ANGIOGRAPHY HEAD AND NECK WITH AND WITHOUT CONTRAST TECHNIQUE: Multidetector CT imaging of the head and neck was performed using the standard protocol during bolus administration of intravenous contrast. Multiplanar CT image reconstructions and MIPs were obtained to evaluate the vascular anatomy. Carotid stenosis measurements (when applicable) are obtained utilizing NASCET criteria, using the distal internal carotid diameter as the denominator. RADIATION DOSE REDUCTION: This exam was performed according to the departmental dose-optimization program which includes automated exposure control, adjustment of the mA and/or kV according to patient size and/or use of iterative reconstruction technique. CONTRAST:  75mL OMNIPAQUE  IOHEXOL  350 MG/ML SOLN COMPARISON:  Head CT today reported separately. CTA head and neck on 01/08/2024. FINDINGS: CTA NECK Skeleton: Cervical spine degeneration most pronounced at the anterior C1-odontoid level. There is right cervical facet arthropathy also at C2-C3. But overall mild for age cervical spine degeneration by CT. No acute or suspicious osseous lesion identified. Upper chest: Upper lungs appear clear. No superior mediastinal lymphadenopathy. Other neck: Thyroid, larynx (glottis is closed), pharynx, parapharyngeal spaces, retropharyngeal space, sublingual space, submandibular spaces, masticator and parotid spaces appear stable and within normal limits. Aortic arch: Stable 3 vessel arch, Calcified aortic atherosclerosis. Right carotid system: Stable. Widespread atherosclerosis of the right CCA, right carotid bifurcation and proximal right ICA but no significant stenosis. Left carotid system: Stable atherosclerosis without stenosis. Vertebral arteries: Proximal right subclavian  artery minimal plaque without stenosis. Bulky calcified plaque at the right vertebral artery origin redemonstrated, remains patent but with severe stenosis on series 601, image 82 which is stable. Right vertebral mild additional V1 plaque, patent to the skull base and stable. Proximal left subclavian artery plaque without stenosis. Left vertebral artery origin relatively normal on series 601, image 82. Mild left V1 plaque. Codominant left vertebral artery is stable and patent to the skull base. CTA HEAD Posterior circulation: Distal vertebral arteries are patent with  mild irregularity, the left V4 somewhat dominant, unchanged. Right PICA origin remains patent. Vertebrobasilar junction remains patent without significant stenosis. Basilar artery irregularity, but patent and without significant stenosis, stable from last month. SCA and PCA origins are patent. Bilateral PCA branches remain patent although are mildly to moderately irregular throughout as seen on series 605, image 29, stable. Anterior circulation: Both ICA siphons are patent with stable calcified plaque, mild to moderate on the left and mild on the right. Moderate distal left ICA supraclinoid segment stenosis is stable on series 602, image One hundred thirty-six. Carotid termini remain patent. Stable MCA and ACA origins. Bilateral ACA branches are stable, median artery of the corpus callosum. Similar mild to moderate branch irregularity throughout as seen on the PCAs. MCA M1 segments and bi/trifurcations are patent. Bilateral MCA branches are stable, with multifocal moderate M2 and M3 branch irregularity which is more apparent on the right (series 605, image 18), stable. Venous sinuses: Patent. Anatomic variants: None significant. Review of the MIP images confirms the above findings IMPRESSION: 1. Stable CTA head and neck since 01/08/2024. Negative for large vessel occlusion, positive for age advanced Intracranial and extracranial atherosclerosis. Notable  stenoses include: - Right vertebral origin, Severe due to calcified plaque. - Left ICA siphon supraclinoid segment, Moderatedue to soft plaque. - Right MCA M2 and M3 branches, Moderate. 2. Generally mild for age cervical spine degeneration. 3.  Aortic Atherosclerosis (ICD10-I70.0). Electronically Signed   By: Marlise Simpers M.D.   On: 02/06/2024 06:44    Assessment/Plan  1. Uncontrolled hypertension (Primary) -  Poorly controlled due to missed medication. History of light stroke in similar situations. Current medications include lisinopril , amlodipine , and metoprolol  succinate. - Send prescriptions for lisinopril , amlodipine , and metoprolol  succinate to pharmacy. - Advise daily blood pressure monitoring and bring readings to next appointment. - Schedule follow-up in one month. - amLODipine  (NORVASC ) 5 MG tablet; Take 1 tablet (5 mg total) by mouth daily. NEEDS APPT WITH PCP for additional refills.  Dispense: 90 tablet; Refill: 1 - lisinopril  (ZESTRIL ) 10 MG tablet; Take 1 tablet (10 mg total) by mouth daily. NEEDS APPT WITH PCP for additional refills.  Dispense: 90 tablet; Refill: 1 - metoprolol  succinate (TOPROL -XL) 25 MG 24 hr tablet; Take 1 tablet (25 mg total) by mouth daily.  Dispense: 90 tablet; Refill: 1  2. Acute pain of left knee -  Pain due to pulled muscle from misstep. Ibuprofen  contraindicated due to HIV medication interactions. - Discontinue ibuprofen . - Recommend acetaminophen  1000 mg every 8 hours as needed. - Advise use of ice to reduce swelling. - acetaminophen  (TYLENOL ) 500 MG tablet; Take 2 tablets (1,000 mg total) by mouth every 8 (eight) hours as needed. (Patient taking differently: Take 1,000 mg by mouth every 8 (eight) hours as needed for moderate pain (pain score 4-6) or mild pain (pain score 1-3).)  3. Tobacco abuse -  counseled to taper off tobacco  4. Alcohol abuse -  counseled to taper off alcohol  5. HIV disease (HCC) - Continue Odefsey  200 - 25 - 25 mg 1 tab  daily - Follows up with Infectious Disease     Labs/tests ordered:   None   Return in about 4 weeks (around 03/04/2024).  Lenka Zhao Medina-Vargas, NP

## 2024-02-05 NOTE — Telephone Encounter (Signed)
 Message sent to Roselie Conger, NP due to PCP Ngetich, Elijio Guadeloupe, NP being out of office.

## 2024-02-06 ENCOUNTER — Emergency Department (HOSPITAL_BASED_OUTPATIENT_CLINIC_OR_DEPARTMENT_OTHER): Payer: Self-pay

## 2024-02-06 ENCOUNTER — Emergency Department (HOSPITAL_COMMUNITY): Payer: Self-pay

## 2024-02-06 ENCOUNTER — Emergency Department (HOSPITAL_BASED_OUTPATIENT_CLINIC_OR_DEPARTMENT_OTHER)
Admission: EM | Admit: 2024-02-06 | Discharge: 2024-02-06 | Disposition: A | Discharge status: Home / Self Care | Payer: Self-pay | Attending: Emergency Medicine | Admitting: Emergency Medicine

## 2024-02-06 ENCOUNTER — Other Ambulatory Visit: Payer: Self-pay

## 2024-02-06 ENCOUNTER — Encounter (HOSPITAL_BASED_OUTPATIENT_CLINIC_OR_DEPARTMENT_OTHER): Payer: Self-pay | Admitting: Emergency Medicine

## 2024-02-06 DIAGNOSIS — Z21 Asymptomatic human immunodeficiency virus [HIV] infection status: Secondary | ICD-10-CM | POA: Insufficient documentation

## 2024-02-06 DIAGNOSIS — I10 Essential (primary) hypertension: Secondary | ICD-10-CM | POA: Insufficient documentation

## 2024-02-06 DIAGNOSIS — R29898 Other symptoms and signs involving the musculoskeletal system: Secondary | ICD-10-CM

## 2024-02-06 DIAGNOSIS — Z79899 Other long term (current) drug therapy: Secondary | ICD-10-CM | POA: Insufficient documentation

## 2024-02-06 DIAGNOSIS — R531 Weakness: Secondary | ICD-10-CM | POA: Insufficient documentation

## 2024-02-06 DIAGNOSIS — M542 Cervicalgia: Secondary | ICD-10-CM | POA: Insufficient documentation

## 2024-02-06 LAB — DIFFERENTIAL
Abs Immature Granulocytes: 0.02 10*3/uL (ref 0.00–0.07)
Basophils Absolute: 0.1 10*3/uL (ref 0.0–0.1)
Basophils Relative: 1 %
Eosinophils Absolute: 0.2 10*3/uL (ref 0.0–0.5)
Eosinophils Relative: 2 %
Immature Granulocytes: 0 %
Lymphocytes Relative: 41 %
Lymphs Abs: 3.8 10*3/uL (ref 0.7–4.0)
Monocytes Absolute: 1 10*3/uL (ref 0.1–1.0)
Monocytes Relative: 11 %
Neutro Abs: 4.3 10*3/uL (ref 1.7–7.7)
Neutrophils Relative %: 45 %

## 2024-02-06 LAB — COMPREHENSIVE METABOLIC PANEL WITH GFR
ALT: 22 U/L (ref 0–44)
AST: 33 U/L (ref 15–41)
Albumin: 4.2 g/dL (ref 3.5–5.0)
Alkaline Phosphatase: 119 U/L (ref 38–126)
Anion gap: 16 — ABNORMAL HIGH (ref 5–15)
BUN: 8 mg/dL (ref 6–20)
CO2: 22 mmol/L (ref 22–32)
Calcium: 9.4 mg/dL (ref 8.9–10.3)
Chloride: 101 mmol/L (ref 98–111)
Creatinine, Ser: 0.93 mg/dL (ref 0.61–1.24)
GFR, Estimated: >60 mL/min (ref >60)
Glucose, Bld: 137 mg/dL — ABNORMAL HIGH (ref 70–99)
Potassium: 4 mmol/L (ref 3.5–5.1)
Sodium: 138 mmol/L (ref 135–145)
Total Bilirubin: 0.7 mg/dL (ref 0.0–1.2)
Total Protein: 8 g/dL (ref 6.5–8.1)

## 2024-02-06 LAB — URINE DRUG SCREEN
Amphetamines: NOT DETECTED
Barbiturates: NOT DETECTED
Benzodiazepines: NOT DETECTED
Cocaine: NOT DETECTED
Fentanyl: NOT DETECTED
Methadone Scn, Ur: NOT DETECTED
Opiates: DETECTED — AB
Tetrahydrocannabinol: NOT DETECTED

## 2024-02-06 LAB — CBC
HCT: 42.1 % (ref 39.0–52.0)
Hemoglobin: 14.5 g/dL (ref 13.0–17.0)
MCH: 30.9 pg (ref 26.0–34.0)
MCHC: 34.4 g/dL (ref 30.0–36.0)
MCV: 89.8 fL (ref 80.0–100.0)
Platelets: 212 10*3/uL (ref 150–400)
RBC: 4.69 MIL/uL (ref 4.22–5.81)
RDW: 14.5 % (ref 11.5–15.5)
WBC: 9.4 10*3/uL (ref 4.0–10.5)
nRBC: 0 % (ref 0.0–0.2)

## 2024-02-06 LAB — PROTIME-INR
INR: 1 (ref 0.8–1.2)
Prothrombin Time: 13.3 s (ref 11.4–15.2)

## 2024-02-06 LAB — CBG MONITORING, ED: Glucose-Capillary: 163 mg/dL — ABNORMAL HIGH (ref 70–99)

## 2024-02-06 LAB — APTT: aPTT: 28 s (ref 24–36)

## 2024-02-06 LAB — TROPONIN T, HIGH SENSITIVITY: Troponin T High Sensitivity: 22 ng/L — ABNORMAL HIGH (ref <19)

## 2024-02-06 LAB — ETHANOL: Alcohol, Ethyl (B): <15 mg/dL (ref <15)

## 2024-02-06 MED ORDER — IOHEXOL 350 MG/ML SOLN
75.0000 mL | Freq: Once | INTRAVENOUS | Status: AC | PRN
  Administered 2024-02-06: 75 mL via INTRAVENOUS

## 2024-02-06 MED ORDER — MORPHINE SULFATE (PF) 4 MG/ML IV SOLN
4.0000 mg | Freq: Once | INTRAVENOUS | Status: AC
  Administered 2024-02-06: 4 mg via INTRAVENOUS
  Filled 2024-02-06: qty 1

## 2024-02-06 MED ORDER — LISINOPRIL 10 MG PO TABS
10.0000 mg | ORAL_TABLET | Freq: Once | ORAL | Status: AC
  Administered 2024-02-06: 10 mg via ORAL
  Filled 2024-02-06: qty 1

## 2024-02-06 MED ORDER — ASPIRIN 81 MG PO CHEW
324.0000 mg | CHEWABLE_TABLET | Freq: Once | ORAL | Status: AC
  Administered 2024-02-06: 324 mg via ORAL
  Filled 2024-02-06: qty 4

## 2024-02-06 MED ORDER — DIAZEPAM 5 MG/ML IJ SOLN
5.0000 mg | Freq: Once | INTRAMUSCULAR | Status: AC
  Administered 2024-02-06: 5 mg via INTRAVENOUS
  Filled 2024-02-06: qty 2

## 2024-02-06 MED ORDER — AMLODIPINE BESYLATE 5 MG PO TABS
5.0000 mg | ORAL_TABLET | Freq: Once | ORAL | Status: AC
  Administered 2024-02-06: 5 mg via ORAL
  Filled 2024-02-06: qty 1

## 2024-02-06 MED ORDER — METOPROLOL TARTRATE 25 MG PO TABS
25.0000 mg | ORAL_TABLET | Freq: Two times a day (BID) | ORAL | Status: DC
  Administered 2024-02-06: 25 mg via ORAL
  Filled 2024-02-06: qty 1

## 2024-02-06 NOTE — ED Notes (Signed)
Returned fro MRI.

## 2024-02-06 NOTE — ED Notes (Signed)
Pt refusing MRI

## 2024-02-06 NOTE — ED Provider Notes (Signed)
 [place] [facility] Provider Note   CSN: [id number] Arrival date & time: 02/06/24  0515     History  Chief Complaint  Patient presents with   Hypertension   Neck Pain    Brandon Quinn is a 58 y.o. male.  Patient with a history of hypertension, HIV that is undetectable, previous stroke with right-sided deficits here with left-sided neck pain and left arm weakness.  Symptoms started about 2 AM when he woke up.  Went to sleep at 8 PM he was normal.  He was seen in the ED yesterday for elevated blood pressure.  He has not had his blood pressure medications for the past 4 days.  He did see his PCP and refills were provided yesterday but he states there is an issue with his insurance at the pharmacy.  Has not had his blood pressure medication for the past 4 days.  Complains of pain to his left neck that is similar to when he was seen about a month ago (chart states it was actually R sided weakness at that time).  Associated with left arm and leg weakness.  Denies headache.  Denies chest pain, shortness of breath, nausea, vomiting, visual changes.  No fever.  Does not take any blood thinners.  His neck pain is similar to when he is he was seen about a month ago and found to have multiple areas of vascular narrowing and old infarcts.  Chart review shows weakness on R side at that time but he reports that it was on left side. He was recommended for MRI at that time but declined transfer.  The history is provided by the patient.  Hypertension Pertinent negatives include no chest pain, no abdominal pain, no headaches and no shortness of breath.  Neck Pain Associated symptoms: weakness   Associated symptoms: no chest pain, no fever and no headaches        Home Medications Prior to Admission medications   Medication Sig Start Date End Date Taking? Authorizing Provider  acetaminophen  (TYLENOL ) 500 MG tablet Take 2 tablets (1,000 mg total) by mouth every 8  (eight) hours as needed. 02/05/24   Medina-Vargas, Monina C, NP  amLODipine  (NORVASC ) 5 MG tablet Take 1 tablet (5 mg total) by mouth daily. NEEDS APPT WITH PCP for additional refills. 02/05/24   Medina-Vargas, Monina C, NP  atorvastatin  (LIPITOR) 40 MG tablet Take 1 tablet (40 mg total) by mouth daily. 02/05/24   Verma Gobble, NP  emtricitabine -rilpivir-tenofovir  AF (ODEFSEY ) 200-25-25 MG TABS tablet Take 1 tablet by mouth daily. 02/04/24   Vu, Garnette Ka T, MD  ibuprofen  (ADVIL ) 800 MG tablet Take 1 tablet (800 mg total) by mouth every 8 (eight) hours as needed. Patient not taking: Reported on 02/05/2024 01/08/24   Ernestina Headland, MD  lisinopril  (ZESTRIL ) 10 MG tablet Take 1 tablet (10 mg total) by mouth daily. NEEDS APPT WITH PCP for additional refills. 02/05/24   Medina-Vargas, Monina C, NP  metoprolol  succinate (TOPROL -XL) 25 MG 24 hr tablet Take 1 tablet (25 mg total) by mouth daily. 02/05/24   Medina-Vargas, Monina C, NP      Allergies    Patient has no known allergies.    Review of Systems   Review of Systems  Constitutional:  Negative for activity change, appetite change and fever.  HENT:  Negative for congestion and rhinorrhea.   Respiratory:  Negative for cough, chest tightness and shortness of breath.   Cardiovascular:  Negative for chest pain.  Gastrointestinal:  Negative for abdominal pain, nausea and vomiting.  Genitourinary:  Negative for dysuria and hematuria.  Musculoskeletal:  Positive for neck pain.  Neurological:  Positive for weakness. Negative for headaches.   all other systems are negative except as noted in the HPI and PMH.    Physical Exam Updated Vital Signs BP (!) 221/91 (BP Location: Left Arm)   Pulse (!) 104   Temp 98.1 F (36.7 C) (Oral)   Resp 20   SpO2 100%  Physical Exam Vitals and nursing note reviewed.  Constitutional:      General: He is not in acute distress.    Appearance: He is well-developed.  HENT:     Head: Normocephalic and atraumatic.      Mouth/Throat:     Pharynx: No oropharyngeal exudate.  Eyes:     Conjunctiva/sclera: Conjunctivae normal.     Pupils: Pupils are equal, round, and reactive to light.  Neck:     Comments: Left paraspinal cervical tenderness, no midline tenderness Pain with attempted ROM neck. Cardiovascular:     Rate and Rhythm: Normal rate and regular rhythm.     Heart sounds: Normal heart sounds. No murmur heard. Pulmonary:     Effort: Pulmonary effort is normal. No respiratory distress.     Breath sounds: Normal breath sounds.  Abdominal:     Palpations: Abdomen is soft.     Tenderness: There is no abdominal tenderness. There is no guarding or rebound.  Musculoskeletal:        General: No tenderness. Normal range of motion.     Cervical back: Normal range of motion and neck supple.     Comments: Intact DP and PT pulses bilateral  Skin:    General: Skin is warm.  Neurological:     Mental Status: He is alert and oriented to person, place, and time.     Cranial Nerves: No cranial nerve deficit.     Motor: No abnormal muscle tone.     Coordination: Coordination normal.     Comments: No facial droop.  Tongue is midline.  Decreased grip strength on the left compared to right.  Decreased sensation involving left arm.  Weakness raising left leg off the bed compared to right.  5/5 strength on the right.  Psychiatric:        Behavior: Behavior normal.     ED Results / Procedures / Treatments   Labs (all labs ordered are listed, but only abnormal results are displayed) Labs Reviewed  COMPREHENSIVE METABOLIC PANEL WITH GFR - Abnormal; Notable for the following components:      Result Value   Glucose, Bld 137 (*)    Anion gap 16 (*)    All other components within normal limits  CBG MONITORING, ED - Abnormal; Notable for the following components:   Glucose-Capillary 163 (*)    All other components within normal limits  TROPONIN T, HIGH SENSITIVITY - Abnormal; Notable for the following components:    Troponin T High Sensitivity 22 (*)    All other components within normal limits  ETHANOL  PROTIME-INR  APTT  CBC  DIFFERENTIAL  URINE DRUG SCREEN  TROPONIN T, HIGH SENSITIVITY    EKG EKG Interpretation Date/Time:  Friday Feb 06 2024 06:06:49 EDT Ventricular Rate:  99 PR Interval:  158 QRS Duration:  138 QT Interval:  400 QTC Calculation: 514 R Axis:   268  Text Interpretation: Sinus rhythm Consider left atrial enlargement RBBB and LAFB LVH by voltage duplicate Confirmed by Cyanne Delmar,  Mara Seminole ([id number]) on 02/06/2024 6:13:03 AM  Radiology CT HEAD CODE STROKE WO CONTRAST Addendum Date: 02/06/2024 ADDENDUM REPORT: 02/06/2024 06:44 ADDENDUM: Study discussed by telephone with Dr. Earma Gloss on 02/06/2024 at 0626 hours. Electronically Signed   By: Marlise Simpers M.D.   On: 02/06/2024 06:44   Result Date: 02/06/2024 CLINICAL DATA:  Code stroke. 58 year old male with neurologic deficit. HIV. EXAM: CT HEAD WITHOUT CONTRAST TECHNIQUE: Contiguous axial images were obtained from the base of the skull through the vertex without intravenous contrast. RADIATION DOSE REDUCTION: This exam was performed according to the departmental dose-optimization program which includes automated exposure control, adjustment of the mA and/or kV according to patient size and/or use of iterative reconstruction technique. COMPARISON:  Brain MRI 08/09/2020.  Head CT 01/08/2024. FINDINGS: Brain: Chronic cysts of the atrium of the left lateral ventricle, which appeared simple on 2021 MRI, size and configuration stable since that time (series 2, image 16). Stable ventricle size and configuration otherwise. No significant ventriculomegaly. Cerebral volume does not appear significantly changed from 2021. No acute intracranial hemorrhage identified. No midline shift, mass effect, or evidence of intracranial mass lesion. Stable patchy scattered white matter hypodensity in both hemispheres from last month. Some deep white matter capsule  involvement suspected. No cortically based acute infarct identified. Vascular: No suspicious intracranial vascular hyperdensity. Calcified atherosclerosis at the skull base. Skull: Intact.  No acute osseous abnormality identified. Sinuses/Orbits: Visualized paranasal sinuses and mastoids are stable and well aerated. Large chronic right frontoethmoidal recess osteoma, most likely a normal variant. Other: No gaze deviation. No acute orbit or scalp soft tissue finding. ASPECTS [facility] Stroke Program Early CT Score) Total score (0-10 with 10 being normal): 10 IMPRESSION: 1. No acute cortically based infarct or acute intracranial hemorrhage identified. ASPECTS 10. 2. Stable non contrast CT appearance of the brain from last month. Patchy chronic white matter disease. Chronic benign appearing cyst of the atrium of the left lateral ventricle. Electronically Signed: By: Marlise Simpers M.D. On: 02/06/2024 06:18   CT ANGIO HEAD NECK W WO CM (CODE STROKE) Result Date: 02/06/2024 CLINICAL DATA:  58 year old male with neck pain and weakness. Similar presentation on 01/08/2024. EXAM: CT ANGIOGRAPHY HEAD AND NECK WITH AND WITHOUT CONTRAST TECHNIQUE: Multidetector CT imaging of the head and neck was performed using the standard protocol during bolus administration of intravenous contrast. Multiplanar CT image reconstructions and MIPs were obtained to evaluate the vascular anatomy. Carotid stenosis measurements (when applicable) are obtained utilizing NASCET criteria, using the distal internal carotid diameter as the denominator. RADIATION DOSE REDUCTION: This exam was performed according to the departmental dose-optimization program which includes automated exposure control, adjustment of the mA and/or kV according to patient size and/or use of iterative reconstruction technique. CONTRAST:  75mL OMNIPAQUE  IOHEXOL  350 MG/ML SOLN COMPARISON:  Head CT today reported separately. CTA head and neck on 01/08/2024. FINDINGS: CTA NECK Skeleton:  Cervical spine degeneration most pronounced at the anterior C1-odontoid level. There is right cervical facet arthropathy also at C2-C3. But overall mild for age cervical spine degeneration by CT. No acute or suspicious osseous lesion identified. Upper chest: Upper lungs appear clear. No superior mediastinal lymphadenopathy. Other neck: Thyroid, larynx (glottis is closed), pharynx, parapharyngeal spaces, retropharyngeal space, sublingual space, submandibular spaces, masticator and parotid spaces appear stable and within normal limits. Aortic arch: Stable 3 vessel arch, Calcified aortic atherosclerosis. Right carotid system: Stable. Widespread atherosclerosis of the right CCA, right carotid bifurcation and proximal right ICA but no significant stenosis. Left carotid system: Stable atherosclerosis  without stenosis. Vertebral arteries: Proximal right subclavian artery minimal plaque without stenosis. Bulky calcified plaque at the right vertebral artery origin redemonstrated, remains patent but with severe stenosis on series 601, image 82 which is stable. Right vertebral mild additional V1 plaque, patent to the skull base and stable. Proximal left subclavian artery plaque without stenosis. Left vertebral artery origin relatively normal on series 601, image 82. Mild left V1 plaque. Codominant left vertebral artery is stable and patent to the skull base. CTA HEAD Posterior circulation: Distal vertebral arteries are patent with mild irregularity, the left V4 somewhat dominant, unchanged. Right PICA origin remains patent. Vertebrobasilar junction remains patent without significant stenosis. Basilar artery irregularity, but patent and without significant stenosis, stable from last month. SCA and PCA origins are patent. Bilateral PCA branches remain patent although are mildly to moderately irregular throughout as seen on series 605, image 29, stable. Anterior circulation: Both ICA siphons are patent with stable calcified  plaque, mild to moderate on the left and mild on the right. Moderate distal left ICA supraclinoid segment stenosis is stable on series 602, image One hundred thirty-six. Carotid termini remain patent. Stable MCA and ACA origins. Bilateral ACA branches are stable, median artery of the corpus callosum. Similar mild to moderate branch irregularity throughout as seen on the PCAs. MCA M1 segments and bi/trifurcations are patent. Bilateral MCA branches are stable, with multifocal moderate M2 and M3 branch irregularity which is more apparent on the right (series 605, image 18), stable. Venous sinuses: Patent. Anatomic variants: None significant. Review of the MIP images confirms the above findings IMPRESSION: 1. Stable CTA head and neck since 01/08/2024. Negative for large vessel occlusion, positive for age advanced Intracranial and extracranial atherosclerosis. Notable stenoses include: - Right vertebral origin, Severe due to calcified plaque. - Left ICA siphon supraclinoid segment, Moderatedue to soft plaque. - Right MCA M2 and M3 branches, Moderate. 2. Generally mild for age cervical spine degeneration. 3.  Aortic Atherosclerosis (ICD10-I70.0). Electronically Signed   By: Marlise Simpers M.D.   On: 02/06/2024 06:44    Procedures .Critical Care  Performed by: Earma Gloss, MD Authorized by: Earma Gloss, MD   Critical care provider statement:    Critical care time (minutes):  45   Critical care time was exclusive of:  Separately billable procedures and treating other patients   Critical care was necessary to treat or prevent imminent or life-threatening deterioration of the following conditions:  CNS failure or compromise   Critical care was time spent personally by me on the following activities:  Development of treatment plan with patient or surrogate, discussions with consultants, evaluation of patient's response to treatment, examination of patient, ordering and review of laboratory studies, ordering and  review of radiographic studies, ordering and performing treatments and interventions, pulse oximetry, re-evaluation of patient's condition, review of old charts, blood draw for specimens and obtaining history from patient or surrogate   I assumed direction of critical care for this patient from another provider in my specialty: no     Care discussed with: admitting provider       Medications Ordered in ED Medications - No data to display  ED Course/ Medical Decision Making/ A&P                                 Medical Decision Making Amount and/or Complexity of Data Reviewed Labs: ordered. Decision-making details documented in ED Course. Radiology: ordered and independent interpretation  performed. Decision-making details documented in ED Course. ECG/medicine tests: ordered and independent interpretation performed. Decision-making details documented in ED Course.  Risk OTC drugs. Prescription drug management.   Patient with hypertension, HIV, previous stroke here with elevated blood pressure left-sided neck pain.  Also with left arm weakness and leg weakness.  Last seen normal was 8 PM last night.  Code stroke activated on initial assessment.  Denies chest pain. He has not had his blood pressure medication for 3 to 4 days Not tnk candidate given >4.5 since symptom onset.   CT head negative for hemorrhage. D/w Dr. Del Favia.  D/w Dr. Farrel Hones of neurology who agrees patient likely needs transfer for MRI brain and C spine.   CTA stable compared to last month. Stenosis of R vert, bilateral ICA, R MCA. No large vessel occlusion.  Labs stable. Troponin minimally elevated, likely 2/2 hypertension. Will trend. He denies chest pain.   No fever, no leukocytosis. Low suspicion for meningitis. Does have HIV but is undetectable. ROM improved after pain meds.  Similar presentation last month.   Needs MRI to evaluate for new infarct. Also consider cervical radiculopathy or discitis. Patient hesitant to  agree to transfer for MRI but eventually did after much discussion with patient and neurology.  Will allow permissive hypertension at this time until new CVA ruled out.  Transfer to [facility] ED d/w Dr. Monique Ano who accepts.         Final Clinical Impression(s) / ED Diagnoses Final diagnoses:  LUE weakness  Primary hypertension    Rx / DC Orders ED Discharge Orders     None         Eunice Winecoff, Mara Seminole, MD 02/06/24 [phone]

## 2024-02-06 NOTE — ED Notes (Signed)
 Spoke to Lauro Portal ER charge RN, about getting pt a cab voucher. She says they will be able to do that.   Anastacio Balm, RN

## 2024-02-06 NOTE — Progress Notes (Addendum)
 0601: Tele stroke cart activated at this time.   4034: Pt to CT at this time.   7425: TSP paged at this time.   9563: TSP on cart at this time. Results of NCCT given to TSP at this time.   8756: Pt back from CT   0637: TSP and TSRN off cart at this time. TSP to follow up with EDP for plan of care.   4332: Results of CTA provided to TSP.

## 2024-02-06 NOTE — Discharge Instructions (Signed)
 It is important to follow-up with your primary care physician.  Do not hesitate to return here for concerning changes in your condition.

## 2024-02-06 NOTE — ED Notes (Signed)
 Arrived from [facility]  for MRI

## 2024-02-06 NOTE — Telephone Encounter (Signed)
 Patient scheduled for follow up 03/04/2024

## 2024-02-06 NOTE — ED Notes (Signed)
 Transported to MRI

## 2024-02-06 NOTE — ED Provider Notes (Signed)
 Patient transferred here for MRI.  In no distress on arrival.   Dorenda Gandy, MD 02/06/24 1102

## 2024-02-06 NOTE — ED Triage Notes (Signed)
 Pt reports HTN. Hasn't been able to get meds due to insurance issues. Also c/o neck pain.

## 2024-02-06 NOTE — ED Provider Notes (Signed)
 Patient in no distress after MRI results were available which did not demonstrate acute stroke.  Patient declines additional testing including vascular studies, states that he will follow-up as an outpatient.   Dorenda Gandy, MD 02/06/24 [phone]

## 2024-02-06 NOTE — Consult Note (Addendum)
 [facility] Consult Services   Patient Name:   Brandon Quinn, Brandon Quinn Date of Birth:   1966-10-03 Identification Number:   MRN - [id number] Date of Service:   02/06/2024 06:18:13  Diagnosis:       M54.2 - Cervicalgia       G83.2 - Monoplegia of upper limb  Impression:      The patient has a h/o HIV and uncontrolled HTN, who presents with neck pain and numbness and weakness of the LUE. No drift at present but sensation is altered. He has a h/o lacunar infarcts without residual deficits. He has a h/o prior presentation for similar symptoms but declined transfer for MRI. Differential diagnosis includes stroke, hypertensive crisis, cervical stenosis. CTH shows no acute findings. CTA shows atherosclerotic disease of the head and neck but no LVO. I recommend asa and further workup of MRI brain and C spine wwo contrast to rule out stroke or cervical disease.  Our recommendations are outlined below.  Recommendations:        Stroke/Telemetry Floor       Neuro Checks       Bedside Swallow Eval       DVT Prophylaxis       IV Fluids, Normal Saline       Head of Bed 30 Degrees       Euglycemia and Avoid Hyperthermia (PRN Acetaminophen )       Initiate or continue Aspirin  325 MG daily       gradual reduction of BP by 15% over 12 hours       transfer for MRI discussed with patient and Dr Alison Irvine but patient is reluctant  Sign Out:       Discussed with Emergency Department Provider    ------------------------------------------------------------------------------  Advanced Imaging: Advanced imaging has been ordered. Results pending.  ADDENDUM:  Advanced Imaging: CTA Head and Neck Completed.   LVO:No  Patient is not eligible for NIR consideration. Proceed with plan as discussed.  Metrics: Last Known Well: 02/05/2024 20:00:00 Dispatch Time: 02/06/2024 06:18:12 Arrival Time: 02/06/2024 05:15:00 Initial Response Time: 02/06/2024 06:21:14 Symptoms: LUE  weakness and neck pain. Initial patient interaction: 02/06/2024 06:27:40 NIHSS Assessment Completed: 02/06/2024 06:30:30 Patient is not a candidate for Thrombolytic. Thrombolytic Medical Decision: 02/06/2024 06:33:35 Patient was not deemed candidate for Thrombolytic because of following reasons: LKW outside 4.5 hr window. .  CT head showed no acute hemorrhage or acute core infarct.  Primary Provider Notified of Diagnostic Impression and Management Plan on: 02/06/2024 06:38:27    ------------------------------------------------------------------------------  History of Present Illness: Patient is a 58 year old Male.  Patient was brought by private transportation with symptoms of LUE weakness and neck pain. He was seen for hypertensive crisis yesterday. He was sent home then At 8pm he started having weakness in the L arm. He had numbness on his L palm. on arrival bp was 221/91. Last month he had presented with neck pain and R sided numbness and weakness (per the notes) and had angiogram but declined transfer for MRI. He says it was actually L side symptoms that time as well. His chief complaint this time again is neck pain. is neck feels stiff. He was out of his home meds. He uses a cane with nerve problem in his L leg at baseline. denies any diplopia, dizziness, headache, and speech changes. No fever, chest pain. He is moving his arm well but feels numb on his palm.   Past Medical History:      Hypertension  Stroke Other PMH:  HIV  alcohol abuse  gout  chronic lacunar infarct L basal ganglia  Medications:  No Anticoagulant use  No Antiplatelet use Reviewed EMR for current medications  Allergies:  Reviewed  Social History: Smoking: Yes Alcohol Use: Yes  Family History:  There is no family history of premature cerebrovascular disease pertinent to this consultation  ROS : 14 Points Review of Systems was performed and was negative except mentioned in HPI.  Past  Surgical History: There Is No Surgical History Contributory To Today's Visit    Examination: BP(221//91), Pulse(85), Blood Glucose(123) 1A: Level of Consciousness - Alert; keenly responsive + 0 1B: Ask Month and Age - Both Questions Right + 0 1C: Blink Eyes & Squeeze Hands - Performs Both Tasks + 0 2: Test Horizontal Extraocular Movements - Normal + 0 3: Test Visual Fields - No Visual Loss + 0 4: Test Facial Palsy (Use Grimace if Obtunded) - Normal symmetry + 0 5A: Test Left Arm Motor Drift - No Drift for 10 Seconds + 0 5B: Test Right Arm Motor Drift - No Drift for 10 Seconds + 0 6A: Test Left Leg Motor Drift - No Drift for 5 Seconds + 0 6B: Test Right Leg Motor Drift - No Drift for 5 Seconds + 0 7: Test Limb Ataxia (FNF/Heel-Shin) - No Ataxia + 0 8: Test Sensation - Mild-Moderate Loss: Less Sharp/More Dull + 1 9: Test Language/Aphasia - Normal; No aphasia + 0 10: Test Dysarthria - Normal + 0 11: Test Extinction/Inattention - No abnormality + 0  NIHSS Score: 1   Pre-Morbid Modified Rankin Scale: 3 Points = Moderate disability; requiring some help, but able to walk without assistance  Spoke with : Dr Alison Irvine over video  This consult was conducted in real time using interactive audio and Immunologist. Patient was informed of the technology being used for this visit and agreed to proceed. Patient located in hospital and provider located at home/office setting.   Patient is being evaluated for possible acute neurologic impairment and high probability of imminent or life-threatening deterioration. I spent total of 35 minutes providing care to this patient, including time for face to face visit via telemedicine, review of medical records, imaging studies and discussion of findings with providers, the patient and/or family.   Dr Ilean Mall   [facility] For Inpatient follow-up with [facility] physician please call [facility] at [phone]. As we are not an outpatient  service for any post hospital discharge needs please contact the hospital for assistance. If you have any questions for the [facility] physicians or need to reconsult for clinical or diagnostic changes please contact us  via [facility] at [phone].

## 2024-02-24 NOTE — Progress Notes (Signed)
 The 10-year ASCVD risk score (Arnett DK, et al., 2019) is: 30%   Values used to calculate the score:     Age: 58 years     Sex: Male     Is Non-Hispanic African American: Yes     Diabetic: No     Tobacco smoker: Yes     Systolic Blood Pressure: 159 mmHg     Is BP treated: Yes     HDL Cholesterol: 66 mg/dL     Total Cholesterol: 219 mg/dL  Currently prescribed atorvastatin  40 mg.   Shahir Karen, BSN, RN

## 2024-03-04 ENCOUNTER — Encounter: Payer: Self-pay | Admitting: Family

## 2024-03-04 ENCOUNTER — Ambulatory Visit: Payer: Self-pay | Admitting: Family

## 2024-03-04 NOTE — Progress Notes (Signed)
   This encounter was created in error - please disregard. No show

## 2024-03-06 ENCOUNTER — Other Ambulatory Visit: Payer: Self-pay | Admitting: Adult Health

## 2024-03-06 DIAGNOSIS — I10 Essential (primary) hypertension: Secondary | ICD-10-CM

## 2024-03-06 MED ORDER — LISINOPRIL 10 MG PO TABS
10.0000 mg | ORAL_TABLET | Freq: Every day | ORAL | 3 refills | Status: DC
Start: 2024-03-06 — End: 2024-04-02

## 2024-03-06 MED ORDER — METOPROLOL SUCCINATE ER 25 MG PO TB24
25.0000 mg | ORAL_TABLET | Freq: Every day | ORAL | 3 refills | Status: DC
Start: 2024-03-06 — End: 2024-04-02

## 2024-03-06 MED ORDER — AMLODIPINE BESYLATE 5 MG PO TABS: 5.0000 mg | ORAL_TABLET | Freq: Every day | ORAL | 3 refills | Status: DC

## 2024-04-02 ENCOUNTER — Other Ambulatory Visit: Payer: Self-pay | Admitting: Family

## 2024-04-02 DIAGNOSIS — I10 Essential (primary) hypertension: Secondary | ICD-10-CM

## 2024-04-02 MED ORDER — LISINOPRIL 10 MG PO TABS: 10.0000 mg | ORAL_TABLET | Freq: Every day | ORAL | 0 refills | Status: DC

## 2024-04-02 MED ORDER — METOPROLOL SUCCINATE ER 25 MG PO TB24: 25.0000 mg | ORAL_TABLET | Freq: Every day | ORAL | 0 refills | Status: DC

## 2024-04-02 MED ORDER — AMLODIPINE BESYLATE 5 MG PO TABS: 5.0000 mg | ORAL_TABLET | Freq: Every day | ORAL | 0 refills | Status: DC

## 2024-04-02 NOTE — Telephone Encounter (Signed)
 Copied from CRM [id number]. Topic: Clinical - Prescription Issue >> Apr 02, 2024 10:29 AM Vivian Z wrote: Reason for CRM: Patient is requesting refills for blood pressure medications. He stated there are 3 of them but he does not have the name in front of him.

## 2024-04-04 ENCOUNTER — Other Ambulatory Visit: Payer: Self-pay | Admitting: Adult Health

## 2024-04-04 DIAGNOSIS — I10 Essential (primary) hypertension: Secondary | ICD-10-CM

## 2024-04-04 MED ORDER — AMLODIPINE BESYLATE 5 MG PO TABS: 5.0000 mg | ORAL_TABLET | Freq: Every day | ORAL | 3 refills | Status: DC

## 2024-04-04 MED ORDER — LISINOPRIL 10 MG PO TABS: 10.0000 mg | ORAL_TABLET | Freq: Every day | ORAL | 3 refills | Status: DC

## 2024-04-04 MED ORDER — METOPROLOL SUCCINATE ER 25 MG PO TB24: 25.0000 mg | ORAL_TABLET | Freq: Every day | ORAL | 3 refills | Status: DC

## 2024-05-04 ENCOUNTER — Other Ambulatory Visit: Payer: Self-pay | Admitting: Family

## 2024-05-04 DIAGNOSIS — I10 Essential (primary) hypertension: Secondary | ICD-10-CM

## 2024-05-04 MED ORDER — AMLODIPINE BESYLATE 5 MG PO TABS: 5.0000 mg | ORAL_TABLET | Freq: Every day | ORAL | 3 refills | Status: DC

## 2024-05-06 ENCOUNTER — Other Ambulatory Visit: Payer: Self-pay | Admitting: Family

## 2024-05-06 DIAGNOSIS — I10 Essential (primary) hypertension: Secondary | ICD-10-CM

## 2024-05-06 NOTE — Telephone Encounter (Signed)
 Copied from CRM [id number]. Topic: Clinical - Medication Refill >> May 06, 2024  3:37 PM Cherylann S wrote: Medication: amLODipine  (NORVASC ) 5 MG tablet  Has the patient contacted their pharmacy? Yes (Agent: If no, request that the patient contact the pharmacy for the refill. If patient does not wish to contact the pharmacy document the reason why and proceed with request.) (Agent: If yes, when and what did the pharmacy advise?)  This is the patient's preferred pharmacy:  [id number] - [place], [place] - [address] AT [facility] OF [address] & [address] [place] [address] Phone: [phone] Fax: [phone]  Is this the correct pharmacy for this prescription? Yes If no, delete pharmacy and type the correct one.   Has the prescription been filled recently? No  Is the patient out of the medication? Yes  Has the patient been seen for an appointment in the last year OR does the patient have an upcoming appointment? Yes  Can we respond through MyChart? No  Agent: Please be advised that Rx refills may take up to 3 business days. We ask that you follow-up with your pharmacy.

## 2024-05-10 ENCOUNTER — Encounter: Payer: Self-pay | Admitting: Adult Health

## 2024-05-10 ENCOUNTER — Ambulatory Visit (INDEPENDENT_AMBULATORY_CARE_PROVIDER_SITE_OTHER): Payer: Self-pay | Admitting: Adult Health

## 2024-05-10 VITALS — BP 138/62 | HR 80 | Temp 98.3°F | Resp 18 | Ht 71.0 in | Wt 192.2 lb

## 2024-05-10 DIAGNOSIS — M542 Cervicalgia: Secondary | ICD-10-CM

## 2024-05-10 DIAGNOSIS — E782 Mixed hyperlipidemia: Secondary | ICD-10-CM

## 2024-05-10 DIAGNOSIS — I10 Essential (primary) hypertension: Secondary | ICD-10-CM

## 2024-05-10 DIAGNOSIS — B20 Human immunodeficiency virus [HIV] disease: Secondary | ICD-10-CM

## 2024-05-10 DIAGNOSIS — Z720 Tobacco use: Secondary | ICD-10-CM

## 2024-05-10 MED ORDER — AMLODIPINE BESYLATE 10 MG PO TABS: 10.0000 mg | ORAL_TABLET | Freq: Every day | ORAL | 6 refills | Status: DC

## 2024-05-10 MED ORDER — NICOTINE 21 MG/24HR TD PT24: 21.0000 mg | MEDICATED_PATCH | Freq: Every day | TRANSDERMAL | 1 refills | Status: DC

## 2024-05-10 NOTE — Progress Notes (Signed)
 [facility] clinic  Provider:  Jereld Serum DNP  Code Status:  Full Code  Goals of Care:     02/06/2024    5:34 AM  Advanced Directives  Does Patient Have a Medical Advance Directive? No  Would patient like information on creating a medical advance directive? No - Patient declined     Chief Complaint  Patient presents with   Medical Management of Chronic Issues    Four weeks follow-up    Discussed the use of AI scribe software for clinical note transcription with the patient, who gave verbal consent to proceed.  HPI: Patient is a 58 y.o. male seen today for a 4-week follow up of chronic medical issues.  He experiences elevated blood pressure readings at home, reaching up to 180 mmHg, particularly in the morning during activity, with the lowest readings around 140-150 mmHg. He is currently on amlodipine  5 mg daily, lisinopril  10 mg daily, and metoprolol  succinate 25 mg daily. There was a previous instance where he ran out of medication but was able to refill it the same day.  He has a history of mixed hyperlipidemia and is on atorvastatin  40 mg daily. His last lipid panel was conducted in May, and he has not had a recent blood draw due to eating prior to the visit.  He has been living with HIV for approximately seven years and is currently taking Odefsey  (emtricitabine /rilpivirine /tenofovir  alafenamide) 200/25/25 mg daily. He follows up with infectious disease specialists regularly and reports doing well with this condition.  He has been experiencing neck pain for the past month and a half, which sometimes restricts his ability to turn his neck. Imaging studies were performed, and the patient was informed that he has mild cervical spine degeneration. He has been using ibuprofen  800 mg as needed, but it has not been effective in alleviating his symptoms.  He works at [employer] and smokes half a pack of cigarettes per day. His diet includes vegetables, onions, garlic, and fresh  tomatoes, with limited meat consumption. No headaches or swelling in his feet.    Past Medical History:  Diagnosis Date   Gout    HIV infection (HCC)    Hyperlipidemia    Hypertension    Substance abuse (HCC)    alcohol    Tobacco abuse     Past Surgical History:  Procedure Laterality Date   WRIST FRACTURE SURGERY  ~ 2009   right; put a plate in it (89/75/7986)    No Known Allergies  Outpatient Encounter Medications as of 05/10/2024  Medication Sig   acetaminophen  (TYLENOL ) 500 MG tablet Take 2 tablets (1,000 mg total) by mouth every 8 (eight) hours as needed. (Patient taking differently: Take 1,000 mg by mouth every 8 (eight) hours as needed for moderate pain (pain score 4-6) or mild pain (pain score 1-3).)   amLODipine  (NORVASC ) 10 MG tablet Take 1 tablet (10 mg total) by mouth daily.   atorvastatin  (LIPITOR) 40 MG tablet Take 1 tablet (40 mg total) by mouth daily.   emtricitabine -rilpivir-tenofovir  AF (ODEFSEY ) 200-25-25 MG TABS tablet Take 1 tablet by mouth daily.   lisinopril  (ZESTRIL ) 10 MG tablet TAKE 1 TABLET(10 MG) BY MOUTH DAILY   metoprolol  succinate (TOPROL -XL) 25 MG 24 hr tablet TAKE 1 TABLET(25 MG) BY MOUTH DAILY   nicotine  (NICODERM CQ  - DOSED IN MG/24 HOURS) 21 mg/24hr patch Place 1 patch (21 mg total) onto the skin daily.   [DISCONTINUED] amLODipine  (NORVASC ) 5 MG tablet Take 1 tablet (5  mg total) by mouth daily.   [DISCONTINUED] ibuprofen  (ADVIL ) 800 MG tablet Take 1 tablet (800 mg total) by mouth every 8 (eight) hours as needed. (Patient not taking: Reported on 05/10/2024)   No facility-administered encounter medications on file as of 05/10/2024.    Review of Systems:  Review of Systems  Constitutional:  Negative for activity change, appetite change and fever.  HENT:  Negative for sore throat.   Eyes: Negative.   Cardiovascular:  Negative for chest pain and leg swelling.  Gastrointestinal:  Negative for abdominal distention, diarrhea and vomiting.   Genitourinary:  Negative for dysuria, frequency and urgency.  Musculoskeletal:  Positive for neck pain.  Skin:  Negative for color change.  Neurological:  Negative for dizziness and headaches.  Psychiatric/Behavioral:  Negative for behavioral problems and sleep disturbance. The patient is not nervous/anxious.     Health Maintenance  Topic Date Due   COVID-19 Vaccine (1) Never done   DTaP/Tdap/Td (1 - Tdap) Never done   Zoster Vaccines- Shingrix (1 of 2) Never done   Colonoscopy  Never done   Pneumococcal Vaccine: 19-49 Years (2 of 2 - PCV) 04/24/2016   Pneumococcal Vaccine: 50+ Years (2 of 2 - PCV) 04/24/2016   Hepatitis B Vaccines (1 of 3 - Risk 3-dose series) Never done   INFLUENZA VACCINE  05/07/2024   Hepatitis C Screening  Completed   HIV Screening  Completed   HPV VACCINES  Aged Out   Meningococcal B Vaccine  Aged Out    Physical Exam: Vitals:   05/10/24 1525  BP: 138/62  Pulse: 80  Resp: 18  Temp: 98.3 F (36.8 C)  SpO2: 99%  Weight: 192 lb 3.2 oz (87.2 kg)  Height: 5' 11 (1.803 m)   Body mass index is 26.81 kg/m. Physical Exam Constitutional:      Appearance: Normal appearance.  HENT:     Head: Normocephalic and atraumatic.     Mouth/Throat:     Mouth: Mucous membranes are moist.  Eyes:     Conjunctiva/sclera: Conjunctivae normal.  Cardiovascular:     Rate and Rhythm: Normal rate and regular rhythm.     Pulses: Normal pulses.     Heart sounds: Normal heart sounds.  Pulmonary:     Effort: Pulmonary effort is normal.     Breath sounds: Normal breath sounds.  Abdominal:     General: Bowel sounds are normal.     Palpations: Abdomen is soft.  Musculoskeletal:        General: No swelling. Normal range of motion.     Cervical back: Normal range of motion.  Skin:    General: Skin is warm and dry.  Neurological:     General: No focal deficit present.     Mental Status: He is alert and oriented to person, place, and time.  Psychiatric:        Mood and  Affect: Mood normal.        Behavior: Behavior normal.        Thought Content: Thought content normal.        Judgment: Judgment normal.     Labs reviewed: Basic Metabolic Panel: Recent Labs    10/11/23 0436 01/08/24 1637 02/06/24 0631  NA 136 133* 138  K 4.1 3.4* 4.0  CL 103 100 101  CO2 23 23 22   GLUCOSE 114* 123* 137*  BUN 7 11 8   CREATININE 0.89 0.92 0.93  CALCIUM  8.6* 8.6* 9.4   Liver Function Tests: Recent Labs  10/11/23 0436 01/08/24 1637 02/06/24 0631  AST 30 23 33  ALT 17 14 22   ALKPHOS 91 94 119  BILITOT 0.7 0.6 0.7  PROT 7.1 8.2* 8.0  ALBUMIN 3.3* 3.7 4.2   Recent Labs    10/11/23 0436  LIPASE 35   No results for input(s): AMMONIA in the last 8760 hours. CBC: Recent Labs    10/11/23 0436 01/08/24 1637 02/06/24 0631  WBC 5.4 11.3* 9.4  NEUTROABS 2.8 6.2 4.3  HGB 13.6 14.2 14.5  HCT 40.1 42.0 42.1  MCV 89.7 89.9 89.8  PLT 227 274 212   Lipid Panel: No results for input(s): CHOL, HDL, LDLCALC, TRIG, CHOLHDL, LDLDIRECT in the last 8760 hours. Lab Results  Component Value Date   HGBA1C 5.8 (H) 10/30/2022    Procedures since last visit: No results found.  Assessment/Plan  1. Essential hypertension (Primary) -  Blood pressure slightly above target with home readings showing spikes. - Increase amlodipine  to 10 mg daily. - Discontinue amlodipine  5 mg. - Follow up in three months or sooner if blood pressure decreases significantly. - amLODipine  (NORVASC ) 10 MG tablet; Take 1 tablet (10 mg total) by mouth daily.  Dispense: 30 tablet; Refill: 6  2. Mixed hyperlipidemia Lab Results  Component Value Date   CHOL 219 (H) 10/30/2022   HDL 66 10/30/2022   LDLCALC 136 (H) 10/30/2022   TRIG 71 10/30/2022   CHOLHDL 3.3 10/30/2022    -  continue Atorvastatin  40 mg daily  3. HIV disease (HCC) -  Managed with Odefsey  200/25/25 mg daily. Follow-up with infectious disease specialist ongoing.  4. Tobacco abuse -  Smoking half a  pack per day, exacerbating hypertension. - Order nicotine  patch to aid in smoking cessation. - nicotine  (NICODERM CQ  - DOSED IN MG/24 HOURS) 21 mg/24hr patch; Place 1 patch (21 mg total) onto the skin daily.  Dispense: 28 patch; Refill: 1  5. Neck pain -  Mild cervical spine degeneration with persistent neck pain. - Discontinue ibuprofen  due to potential interaction with Odefsey . - Recommend over-the-counter Biofreeze or Icy Hot for topical relief. - Suggest use of a heating pad for symptomatic relief.      Labs/tests ordered:   None   Return in about 3 months (around 08/10/2024).  Lyndal Reggio Medina-Vargas, NP

## 2024-06-04 ENCOUNTER — Inpatient Hospital Stay (HOSPITAL_BASED_OUTPATIENT_CLINIC_OR_DEPARTMENT_OTHER)
Admission: EM | Admit: 2024-06-04 | Discharge: 2024-06-06 | DRG: 554 | Disposition: A | Discharge status: Home / Self Care | Payer: MEDICAID | Attending: Internal Medicine | Admitting: Internal Medicine

## 2024-06-04 ENCOUNTER — Other Ambulatory Visit: Payer: Self-pay

## 2024-06-04 ENCOUNTER — Encounter (HOSPITAL_BASED_OUTPATIENT_CLINIC_OR_DEPARTMENT_OTHER): Payer: Self-pay | Admitting: Emergency Medicine

## 2024-06-04 ENCOUNTER — Emergency Department (HOSPITAL_BASED_OUTPATIENT_CLINIC_OR_DEPARTMENT_OTHER): Payer: MEDICAID

## 2024-06-04 DIAGNOSIS — B20 Human immunodeficiency virus [HIV] disease: Secondary | ICD-10-CM | POA: Diagnosis present

## 2024-06-04 DIAGNOSIS — Z79899 Other long term (current) drug therapy: Secondary | ICD-10-CM

## 2024-06-04 DIAGNOSIS — E785 Hyperlipidemia, unspecified: Secondary | ICD-10-CM | POA: Diagnosis present

## 2024-06-04 DIAGNOSIS — M25431 Effusion, right wrist: Secondary | ICD-10-CM | POA: Diagnosis present

## 2024-06-04 DIAGNOSIS — Z8781 Personal history of (healed) traumatic fracture: Secondary | ICD-10-CM

## 2024-06-04 DIAGNOSIS — M79641 Pain in right hand: Secondary | ICD-10-CM

## 2024-06-04 DIAGNOSIS — I10 Essential (primary) hypertension: Secondary | ICD-10-CM | POA: Diagnosis present

## 2024-06-04 DIAGNOSIS — M25531 Pain in right wrist: Principal | ICD-10-CM | POA: Diagnosis present

## 2024-06-04 DIAGNOSIS — M109 Gout, unspecified: Principal | ICD-10-CM | POA: Diagnosis present

## 2024-06-04 DIAGNOSIS — Z8249 Family history of ischemic heart disease and other diseases of the circulatory system: Secondary | ICD-10-CM

## 2024-06-04 DIAGNOSIS — F17210 Nicotine dependence, cigarettes, uncomplicated: Secondary | ICD-10-CM | POA: Diagnosis present

## 2024-06-04 DIAGNOSIS — F1090 Alcohol use, unspecified, uncomplicated: Secondary | ICD-10-CM | POA: Diagnosis present

## 2024-06-04 LAB — COMPREHENSIVE METABOLIC PANEL WITH GFR
ALT: 20 U/L (ref 0–44)
AST: 29 U/L (ref 15–41)
Albumin: 4.3 g/dL (ref 3.5–5.0)
Alkaline Phosphatase: 108 U/L (ref 38–126)
Anion gap: 13 (ref 5–15)
BUN: 8 mg/dL (ref 6–20)
CO2: 26 mmol/L (ref 22–32)
Calcium: 9.2 mg/dL (ref 8.9–10.3)
Chloride: 100 mmol/L (ref 98–111)
Creatinine, Ser: 0.78 mg/dL (ref 0.61–1.24)
GFR, Estimated: >60 mL/min (ref >60)
Glucose, Bld: 93 mg/dL (ref 70–99)
Potassium: 3.3 mmol/L — ABNORMAL LOW (ref 3.5–5.1)
Sodium: 138 mmol/L (ref 135–145)
Total Bilirubin: 0.9 mg/dL (ref 0.0–1.2)
Total Protein: 7.8 g/dL (ref 6.5–8.1)

## 2024-06-04 LAB — CBC WITH DIFFERENTIAL/PLATELET
Abs Immature Granulocytes: 0.03 K/uL (ref 0.00–0.07)
Basophils Absolute: 0 K/uL (ref 0.0–0.1)
Basophils Relative: 0 %
Eosinophils Absolute: 0.1 K/uL (ref 0.0–0.5)
Eosinophils Relative: 1 %
HCT: 43.1 % (ref 39.0–52.0)
Hemoglobin: 14.8 g/dL (ref 13.0–17.0)
Immature Granulocytes: 0 %
Lymphocytes Relative: 31 %
Lymphs Abs: 2.8 K/uL (ref 0.7–4.0)
MCH: 31.1 pg (ref 26.0–34.0)
MCHC: 34.3 g/dL (ref 30.0–36.0)
MCV: 90.5 fL (ref 80.0–100.0)
Monocytes Absolute: 1 K/uL (ref 0.1–1.0)
Monocytes Relative: 12 %
Neutro Abs: 5.1 K/uL (ref 1.7–7.7)
Neutrophils Relative %: 56 %
Platelets: 217 K/uL (ref 150–400)
RBC: 4.76 MIL/uL (ref 4.22–5.81)
RDW: 13.4 % (ref 11.5–15.5)
WBC: 9 K/uL (ref 4.0–10.5)
nRBC: 0 % (ref 0.0–0.2)

## 2024-06-04 LAB — LACTIC ACID, PLASMA: Lactic Acid, Venous: 1.3 mmol/L (ref 0.5–1.9)

## 2024-06-04 MED ORDER — COLCHICINE 0.6 MG PO TABS
0.6000 mg | ORAL_TABLET | Freq: Once | ORAL | Status: AC
  Administered 2024-06-04: 0.6 mg via ORAL
  Filled 2024-06-04: qty 1

## 2024-06-04 MED ORDER — VANCOMYCIN HCL 1750 MG/350ML IV SOLN: 1750.0000 mg | Freq: Once | INTRAVENOUS | Status: DC

## 2024-06-04 MED ORDER — VANCOMYCIN HCL 750 MG IV SOLR
INTRAVENOUS | Status: AC
  Filled 2024-06-04: qty 15

## 2024-06-04 MED ORDER — ACETAMINOPHEN 500 MG PO TABS
1000.0000 mg | ORAL_TABLET | Freq: Once | ORAL | Status: AC
  Administered 2024-06-04: 1000 mg via ORAL
  Filled 2024-06-04: qty 2

## 2024-06-04 MED ORDER — VANCOMYCIN HCL 750 MG IV SOLR
750.0000 mg | Freq: Once | INTRAVENOUS | Status: AC
  Administered 2024-06-04: 750 mg via INTRAVENOUS
  Filled 2024-06-04: qty 15

## 2024-06-04 MED ORDER — LIDOCAINE HCL (PF) 1 % IJ SOLN
5.0000 mL | Freq: Once | INTRAMUSCULAR | Status: AC
  Administered 2024-06-04: 10 mL
  Filled 2024-06-04: qty 5

## 2024-06-04 MED ORDER — KETOROLAC TROMETHAMINE 30 MG/ML IJ SOLN
15.0000 mg | Freq: Once | INTRAMUSCULAR | Status: AC
  Administered 2024-06-04: 15 mg via INTRAVENOUS
  Filled 2024-06-04: qty 1

## 2024-06-04 MED ORDER — VANCOMYCIN HCL 750 MG/150ML IV SOLN
750.0000 mg | Freq: Once | INTRAVENOUS | Status: DC
  Filled 2024-06-04: qty 150

## 2024-06-04 MED ORDER — DEXAMETHASONE SODIUM PHOSPHATE 4 MG/ML IJ SOLN
4.0000 mg | Freq: Once | INTRAMUSCULAR | Status: AC
  Administered 2024-06-04: 4 mg via INTRAVENOUS
  Filled 2024-06-04: qty 1

## 2024-06-04 MED ORDER — SODIUM CHLORIDE 0.9 % IV SOLN
2.0000 g | Freq: Once | INTRAVENOUS | Status: AC
  Administered 2024-06-04: 2 g via INTRAVENOUS
  Filled 2024-06-04: qty 20

## 2024-06-04 MED ORDER — SODIUM CHLORIDE 0.9 % IV SOLN: 1.0000 g | Freq: Once | INTRAVENOUS | Status: DC

## 2024-06-04 MED ORDER — VANCOMYCIN HCL IN DEXTROSE 1-5 GM/200ML-% IV SOLN
1000.0000 mg | Freq: Once | INTRAVENOUS | Status: AC
  Administered 2024-06-04: 1000 mg via INTRAVENOUS
  Filled 2024-06-04: qty 200

## 2024-06-04 NOTE — ED Provider Notes (Signed)
 [facility] Provider Note   CSN: [id number] Arrival date & time: 06/04/24  1711  Patient presents with: Wrist Pain  Brandon Quinn is a 58 y.o. male.  -2 weeks worsening R wrist/hand pain and swelling -Cannot move without extreme pain -No mechanism of injury, no bug bites -Felt feverish and chills starting yesterday  -Distant hx of wrist surgery for fracture about 14 years ago -No CP, SOB, HA, VC -Last took all BP meds this morning    Wrist Pain Pertinent negatives include no chest pain and no shortness of breath.    Prior to Admission medications   Medication Sig Start Date End Date Taking? Authorizing Provider  acetaminophen  (TYLENOL ) 500 MG tablet Take 2 tablets (1,000 mg total) by mouth every 8 (eight) hours as needed. Patient taking differently: Take 1,000 mg by mouth every 8 (eight) hours as needed for moderate pain (pain score 4-6) or mild pain (pain score 1-3). 02/05/24   Medina-Vargas, Monina C, NP  amLODipine  (NORVASC ) 10 MG tablet Take 1 tablet (10 mg total) by mouth daily. 05/10/24   Medina-Vargas, Monina C, NP  atorvastatin  (LIPITOR) 40 MG tablet Take 1 tablet (40 mg total) by mouth daily. 02/05/24   Caro Harlene POUR, NP  emtricitabine -rilpivir-tenofovir  AF (ODEFSEY ) 200-25-25 MG TABS tablet Take 1 tablet by mouth daily. 02/04/24   Vu, Constance T, MD  lisinopril  (ZESTRIL ) 10 MG tablet TAKE 1 TABLET(10 MG) BY MOUTH DAILY 05/04/24   Ngetich, Dinah C, NP  metoprolol  succinate (TOPROL -XL) 25 MG 24 hr tablet TAKE 1 TABLET(25 MG) BY MOUTH DAILY 05/04/24   Ngetich, Dinah C, NP  nicotine  (NICODERM CQ  - DOSED IN MG/24 HOURS) 21 mg/24hr patch Place 1 patch (21 mg total) onto the skin daily. 05/10/24   Medina-Vargas, Monina C, NP    Allergies: Patient has no known allergies.    Review of Systems  Constitutional:  Positive for chills and fever.  Respiratory:  Negative for shortness of breath.   Cardiovascular:  Negative for chest pain.   Musculoskeletal:  Positive for arthralgias and joint swelling.    Updated Vital Signs BP (!) 137/55   Pulse 78   Temp 98.6 F (37 C) (Oral)   Resp 16   Ht 5' 6 (1.676 m)   Wt 87.1 kg   SpO2 92%   BMI 30.99 kg/m   Physical Exam Cardiovascular:     Rate and Rhythm: Normal rate and regular rhythm.  Pulmonary:     Effort: Pulmonary effort is normal.     Breath sounds: Normal breath sounds.  Musculoskeletal:     Right wrist: Swelling present. Normal pulse.     Left wrist: Normal.     Right hand: Swelling and tenderness present. Normal pulse.     Left hand: Normal.     Comments: R wrist/hand ROM limited by significant pain. Normal L wrist/hand ROM.   Neurological:     Mental Status: He is alert.     (all labs ordered are listed, but only abnormal results are displayed) Labs Reviewed  COMPREHENSIVE METABOLIC PANEL WITH GFR - Abnormal; Notable for the following components:      Result Value   Potassium 3.3 (*)    All other components within normal limits  CULTURE, BLOOD (ROUTINE X 2)  CULTURE, BLOOD (ROUTINE X 2)  BODY FLUID CULTURE W GRAM STAIN  CBC WITH DIFFERENTIAL/PLATELET  LACTIC ACID, PLASMA  LACTIC ACID, PLASMA    EKG: None  Radiology: DG Wrist Complete  Right Result Date: 06/04/2024 CLINICAL DATA:  Right wrist edema EXAM: RIGHT WRIST - COMPLETE 3+ VIEW COMPARISON:  May 22, 2016, December 22, 2012 FINDINGS: Volar screw and plate fixation of the distal radius. No hardware fracture or findings of loosening.No acute fracture or dislocation. There is no evidence of arthropathy or other focal bone abnormality. Soft tissues are unremarkable. IMPRESSION: No acute fracture or dislocation. Electronically Signed   By: Rogelia Myers M.D.   On: 06/04/2024 18:07    Procedures   Medications Ordered in the ED  vancomycin  (VANCOCIN ) 750 MG injection (has no administration in time range)  acetaminophen  (TYLENOL ) tablet 1,000 mg (1,000 mg Oral Given 06/04/24 1935)   cefTRIAXone  (ROCEPHIN ) 2 g in sodium chloride  0.9 % 100 mL IVPB (0 g Intravenous Stopped 06/04/24 2023)  vancomycin  (VANCOCIN ) IVPB 1000 mg/200 mL premix (0 mg Intravenous Stopped 06/04/24 2254)  lidocaine  (PF) (XYLOCAINE ) 1 % injection 5 mL (10 mLs Infiltration Given 06/04/24 1936)  ketorolac  (TORADOL ) 30 MG/ML injection 15 mg (15 mg Intravenous Given 06/04/24 2036)  dexamethasone  (DECADRON ) injection 4 mg (4 mg Intravenous Given 06/04/24 2037)  colchicine  tablet 0.6 mg (0.6 mg Oral Given 06/04/24 2118)  vancomycin  (VANCOCIN ) 750 mg in sodium chloride  0.9 % 250 mL IVPB (0 mg Intravenous Stopped 06/04/24 2254)    Clinical Course as of 06/04/24 2330  Fri Jun 04, 2024  2034 Hand. Colchicine  indomethacine. Culture results [MP]    Clinical Course User Index [MP] Pamella Ozell LABOR, DO    Medical Decision Making Amount and/or Complexity of Data Reviewed Labs: ordered. Radiology: ordered.  Risk OTC drugs. Prescription drug management. Decision regarding hospitalization.   Sohail Knupp is a 58 y.o. male is here with R wrist/hand pain and swelling.    Lab Tests:  CBC: WNL CMP: K 3.3  Lactic acid: 1.3  Imaging Studies ordered:  R wrist DG: No acute fracture or dislocation.   Medicines ordered:  CTX 2 mg Vanc 750 mg Toradol  15 mg Tylenol  1g  Colchicine  0.6 mg Decadron  IV 4mg    Plan Given patient hx of HIV and presentation, concern for septic joint. Patient afebrile without leukocytosis. Blood cultures collected.  Empiric antibiotic coverage started.   R wrist joint aspiration performed by Dr Pamella as follows: Appropriate area was palpated, marked, and cleaned. Lidocaine  was injected for local anesthesia. Needle was inserted into wrist. No joint fluid was collected upon aspiration so the aforementioned process was repeated in its entirety. Upon repeat aspiration, minimal amount of purulent/cloudy joint fluid and blood was collected. Sample was sent for culture. Sample amount was not  sufficient for other testing. Toradol  given for pain.   ED provider consulted with orthopedic surgery and hand surgery who recommend admission for possible septic joint vs gout. Colchicine , decadron  given in ED for possible gout.   [facility] consulted for hospital admission. Patient stable for admission. Joint aspiration culture pending.   Final diagnoses:  Right wrist pain  Pain of right hand    ED Discharge Orders     None          Diona Perkins, MD 06/04/24 2330    Pamella Ozell LABOR, DO 06/11/24 1126

## 2024-06-04 NOTE — ED Notes (Signed)
 Received call from [facility] regarding transfer to [facility] at [facility].

## 2024-06-04 NOTE — ED Notes (Signed)
 [facility] called for transport.

## 2024-06-04 NOTE — ED Triage Notes (Signed)
 Pt reports RT wrist swelling x 1 week with limited ROM, denies recent injury or trauma, states hx of wrist surgery, wrist is wrapped

## 2024-06-04 NOTE — Plan of Care (Signed)
 Hospital Medicine Transfer Accept Note Patient Name/Age: Brandon Quinn / 58 y.o. MRN: [id number] Admission Date: 06/04/2024  Once successfully transferred to the appropriate floor, [facility] will assume care for the patient above.  A/P: 88M h/o HIV on ART p/w R wrist pain c/f septic arthritis. EDP performed aspiration (culture pending) and consulted hand surgery. Will need Ortho consult on arrival.

## 2024-06-05 DIAGNOSIS — E785 Hyperlipidemia, unspecified: Secondary | ICD-10-CM | POA: Diagnosis present

## 2024-06-05 DIAGNOSIS — M25531 Pain in right wrist: Secondary | ICD-10-CM

## 2024-06-05 LAB — CBC
HCT: 41.4 % (ref 39.0–52.0)
Hemoglobin: 14 g/dL (ref 13.0–17.0)
MCH: 31.3 pg (ref 26.0–34.0)
MCHC: 33.8 g/dL (ref 30.0–36.0)
MCV: 92.4 fL (ref 80.0–100.0)
Platelets: 204 K/uL (ref 150–400)
RBC: 4.48 MIL/uL (ref 4.22–5.81)
RDW: 13.3 % (ref 11.5–15.5)
WBC: 6.2 K/uL (ref 4.0–10.5)
nRBC: 0 % (ref 0.0–0.2)

## 2024-06-05 LAB — BASIC METABOLIC PANEL WITH GFR
Anion gap: 11 (ref 5–15)
BUN: 8 mg/dL (ref 6–20)
CO2: 22 mmol/L (ref 22–32)
Calcium: 8.7 mg/dL — ABNORMAL LOW (ref 8.9–10.3)
Chloride: 101 mmol/L (ref 98–111)
Creatinine, Ser: 0.83 mg/dL (ref 0.61–1.24)
GFR, Estimated: >60 mL/min (ref >60)
Glucose, Bld: 177 mg/dL — ABNORMAL HIGH (ref 70–99)
Potassium: 4 mmol/L (ref 3.5–5.1)
Sodium: 134 mmol/L — ABNORMAL LOW (ref 135–145)

## 2024-06-05 LAB — C-REACTIVE PROTEIN: CRP: 6.6 mg/dL — ABNORMAL HIGH (ref <1.0)

## 2024-06-05 LAB — SEDIMENTATION RATE: Sed Rate: 22 mm/h — ABNORMAL HIGH (ref 0–16)

## 2024-06-05 LAB — LACTIC ACID, PLASMA: Lactic Acid, Venous: 0.9 mmol/L (ref 0.5–1.9)

## 2024-06-05 LAB — URIC ACID: Uric Acid, Serum: 6 mg/dL (ref 3.7–8.6)

## 2024-06-05 MED ORDER — LISINOPRIL 20 MG PO TABS
10.0000 mg | ORAL_TABLET | Freq: Every day | ORAL | Status: DC
  Administered 2024-06-05 – 2024-06-06 (×2): 10 mg via ORAL
  Filled 2024-06-05 (×2): qty 1

## 2024-06-05 MED ORDER — HYDRALAZINE HCL 20 MG/ML IJ SOLN: 10.0000 mg | INTRAMUSCULAR | Status: DC | PRN

## 2024-06-05 MED ORDER — ENOXAPARIN SODIUM 40 MG/0.4ML IJ SOSY
40.0000 mg | PREFILLED_SYRINGE | INTRAMUSCULAR | Status: DC
  Administered 2024-06-05: 40 mg via SUBCUTANEOUS
  Filled 2024-06-05: qty 0.4

## 2024-06-05 MED ORDER — ONDANSETRON HCL 4 MG/2ML IJ SOLN: 4.0000 mg | Freq: Four times a day (QID) | INTRAMUSCULAR | Status: DC | PRN

## 2024-06-05 MED ORDER — SODIUM CHLORIDE 0.9 % IV SOLN
2.0000 g | INTRAVENOUS | Status: DC
  Administered 2024-06-05: 2 g via INTRAVENOUS
  Filled 2024-06-05: qty 20

## 2024-06-05 MED ORDER — KETOROLAC TROMETHAMINE 15 MG/ML IJ SOLN
15.0000 mg | Freq: Four times a day (QID) | INTRAMUSCULAR | Status: DC
  Administered 2024-06-05 – 2024-06-06 (×5): 15 mg via INTRAVENOUS
  Filled 2024-06-05 (×5): qty 1

## 2024-06-05 MED ORDER — HYDROCODONE-ACETAMINOPHEN 5-325 MG PO TABS: 1.0000 | ORAL_TABLET | ORAL | Status: DC | PRN

## 2024-06-05 MED ORDER — SENNOSIDES-DOCUSATE SODIUM 8.6-50 MG PO TABS
1.0000 | ORAL_TABLET | Freq: Every evening | ORAL | Status: DC | PRN
Start: 2024-06-05 — End: 2024-06-06

## 2024-06-05 MED ORDER — VANCOMYCIN HCL 750 MG/150ML IV SOLN
750.0000 mg | Freq: Two times a day (BID) | INTRAVENOUS | Status: DC
  Administered 2024-06-05 – 2024-06-06 (×3): 750 mg via INTRAVENOUS
  Filled 2024-06-05 (×4): qty 150

## 2024-06-05 MED ORDER — ATORVASTATIN CALCIUM 40 MG PO TABS
40.0000 mg | ORAL_TABLET | Freq: Every day | ORAL | Status: DC
  Administered 2024-06-05 – 2024-06-06 (×2): 40 mg via ORAL
  Filled 2024-06-05 (×2): qty 1

## 2024-06-05 MED ORDER — POTASSIUM CHLORIDE 20 MEQ PO PACK
40.0000 meq | PACK | Freq: Once | ORAL | Status: AC
  Administered 2024-06-05: 40 meq via ORAL
  Filled 2024-06-05: qty 2

## 2024-06-05 MED ORDER — EMTRICITAB-RILPIVIR-TENOFOV AF 200-25-25 MG PO TABS
1.0000 | ORAL_TABLET | Freq: Every day | ORAL | Status: DC
  Administered 2024-06-05 – 2024-06-06 (×2): 1 via ORAL
  Filled 2024-06-05 (×2): qty 1

## 2024-06-05 MED ORDER — ONDANSETRON HCL 4 MG PO TABS: 4.0000 mg | ORAL_TABLET | Freq: Four times a day (QID) | ORAL | Status: DC | PRN

## 2024-06-05 MED ORDER — METOPROLOL SUCCINATE ER 25 MG PO TB24
25.0000 mg | ORAL_TABLET | Freq: Every day | ORAL | Status: DC
  Administered 2024-06-05 – 2024-06-06 (×2): 25 mg via ORAL
  Filled 2024-06-05 (×2): qty 1

## 2024-06-05 MED ORDER — ACETAMINOPHEN 650 MG RE SUPP: 650.0000 mg | Freq: Four times a day (QID) | RECTAL | Status: DC | PRN

## 2024-06-05 MED ORDER — NICOTINE 21 MG/24HR TD PT24
21.0000 mg | MEDICATED_PATCH | Freq: Every day | TRANSDERMAL | Status: DC
  Administered 2024-06-05 – 2024-06-06 (×2): 21 mg via TRANSDERMAL
  Filled 2024-06-05 (×2): qty 1

## 2024-06-05 MED ORDER — ACETAMINOPHEN 325 MG PO TABS: 650.0000 mg | ORAL_TABLET | Freq: Four times a day (QID) | ORAL | Status: DC | PRN

## 2024-06-05 MED ORDER — AMLODIPINE BESYLATE 10 MG PO TABS
10.0000 mg | ORAL_TABLET | Freq: Every day | ORAL | Status: DC
  Administered 2024-06-05 – 2024-06-06 (×2): 10 mg via ORAL
  Filled 2024-06-05 (×2): qty 1

## 2024-06-05 NOTE — Progress Notes (Signed)
 Courtesy visit- No billing  Patient is seen and examined today morning. He is admitted for right wrist pain, swelling admitted for infectious vs inflammatory joint. Joint effusion done, body fluid no organism noted. He has h/o gout with prior flare ups. Patient reports improvement in swelling/pain after receiving antibiotics, steroids, NSAIDs. Continue pain control, antibiotics, follow hand surgery evaluation. Resumed home meds. Further management per clinical course.

## 2024-06-05 NOTE — Hospital Course (Signed)
 Brandon Quinn is a 58 y.o. male with medical history significant for HIV, HTN, HLD, gout, alcohol and tobacco use who is admitted for acute right wrist pain/swelling, septic arthritis versus gout.

## 2024-06-05 NOTE — H&P (Signed)
 History and Physical    Brandon Quinn FMW:[id number] DOB: 01/23/1966 DOA: 06/04/2024  PCP: Leonarda Roxan BROCKS, NP  Patient coming from: Home  I have personally briefly reviewed patient's old medical records in [facility]  Chief Complaint: Right wrist pain/swelling  HPI: Brandon Quinn is a 58 y.o. male with medical history significant for HIV, HTN, HLD, gout, alcohol and tobacco use who presented to the ED for evaluation of right wrist swelling and pain.  Patient reports 2 days acute onset swelling and pain involving his right wrist.  Range of motion was significantly limited due to pain.  Patient denied any injury to the area.  Yesterday he felt feverish and chills.  He does report a history of right wrist surgery for fracture about 14 years ago.  He also reports a history of gout affecting multiple joints including toes, knees, and wrist.  He is not on any medications for management of gout.  Patient does admit to occasional not daily alcohol use but did have recent heavy use.  Patient seen on the floor after transfer from med [facility] ED.  He feels like pain and swelling is improving after receiving antibiotics, steroids, and Toradol .  [facility]  Labs/Imaging on admission: I have personally reviewed following labs and imaging studies.  Initial vitals showed BP 200/77, pulse 95, RR 18, temp 98.3 F, SpO2 100% on room air.  Labs showed WBC 9.0, hemoglobin 14.8, platelets 217, sodium 138, potassium 3.3, bicarb 26, BUN 8, creatinine 0.78, serum glucose 93, LFTs within normal limits, lactic acid 1.3.  Right wrist x-ray negative for acute fracture or dislocation.  Blood cultures in process.  Right wrist joint aspiration was performed by the EDP with minimal amount of purulent/cloudy joint fluid and blood collected.  Joint fluid culture and Gram stain in process, not sent for crystals.  EDP discussed with hand surgery who recommended medical admission for  possible septic joint versus gout.  Patient was given IV Decadron  4 mg, oral colchicine  0.6 mg, IV Toradol  15 mg, and IV vancomycin  and ceftriaxone .  The hospitalist service was consulted for admission.  Review of Systems: All systems reviewed and are negative except as documented in history of present illness above.   Past Medical History:  Diagnosis Date   Gout    HIV infection (HCC)    Hyperlipidemia    Hypertension    Substance abuse (HCC)    alcohol    Tobacco abuse     Past Surgical History:  Procedure Laterality Date   WRIST FRACTURE SURGERY  ~ 2009   right; put a plate in it (89/75/7986)    Social History: Patient reports smoking 0.5 PPD.  He also reports occasional alcohol use.  No Known Allergies  Family History  Problem Relation Age of Onset   Hypertension Sister    Hypertension Brother    Other Other        No known heart disease     Prior to Admission medications   Medication Sig Start Date End Date Taking? Authorizing Provider  acetaminophen  (TYLENOL ) 500 MG tablet Take 2 tablets (1,000 mg total) by mouth every 8 (eight) hours as needed. Patient taking differently: Take 1,000 mg by mouth every 8 (eight) hours as needed for moderate pain (pain score 4-6) or mild pain (pain score 1-3). 02/05/24   Medina-Vargas, Monina C, NP  amLODipine  (NORVASC ) 10 MG tablet Take 1 tablet (10 mg total) by mouth daily. 05/10/24   Medina-Vargas, Monina  C, NP  atorvastatin  (LIPITOR) 40 MG tablet Take 1 tablet (40 mg total) by mouth daily. 02/05/24   Caro Harlene POUR, NP  emtricitabine -rilpivir-tenofovir  AF (ODEFSEY ) 200-25-25 MG TABS tablet Take 1 tablet by mouth daily. 02/04/24   Vu, Constance T, MD  lisinopril  (ZESTRIL ) 10 MG tablet TAKE 1 TABLET(10 MG) BY MOUTH DAILY 05/04/24   Ngetich, Dinah C, NP  metoprolol  succinate (TOPROL -XL) 25 MG 24 hr tablet TAKE 1 TABLET(25 MG) BY MOUTH DAILY 05/04/24   Ngetich, Dinah C, NP  nicotine  (NICODERM CQ  - DOSED IN MG/24 HOURS) 21 mg/24hr patch Place  1 patch (21 mg total) onto the skin daily. 05/10/24   Medina-Vargas, Jereld C, NP    Physical Exam: Vitals:   06/04/24 1930 06/04/24 2135 06/04/24 2200 06/05/24 0001  BP: (!) 177/78 (!) 157/59 (!) 137/55 (!) 177/67  Pulse: 88 86 78 76  Resp: 17 16  18   Temp:  98.6 F (37 C)  98.6 F (37 C)  TempSrc:  Oral  Oral  SpO2: 100% 96% 92% 98%  Weight:      Height:       Constitutional: NAD, calm, comfortable Eyes: EOMI, lids and conjunctivae normal ENMT: Mucous membranes are moist. Posterior pharynx clear of any exudate or lesions.Normal dentition.  Neck: normal, supple, no masses. Respiratory: clear to auscultation bilaterally, no wheezing, no crackles. Normal respiratory effort. No accessory muscle use.  Cardiovascular: Regular rate and rhythm, no murmurs / rubs / gallops. No extremity edema. 2+ pedal pulses. Abdomen: no tenderness, no masses palpated. Musculoskeletal: Mild swelling right wrist with some pain to palpation.  Patient cannot fully close fist.  Extension and flexion at right wrist largely intact and improved from prior per patient.  Bandage in place at joint aspiration site.  Radial pulse intact.  Well-healed right wrist surgical scar. Skin: no rashes, lesions, ulcers. No induration Neurologic: Sensation intact. Strength 5/5 in all 4.  Psychiatric: Normal judgment and insight. Alert and oriented x 3. Normal mood.   EKG: Not performed.  Assessment/Plan Principal Problem:   Acute pain of right wrist Active Problems:   Essential hypertension   HIV disease (HCC)   Hyperlipidemia   Brandon Quinn is a 58 y.o. male with medical history significant for HIV, HTN, HLD, gout, alcohol and tobacco use who is admitted for acute right wrist pain/swelling, septic arthritis versus gout.  Assessment and Plan: Acute right wrist swelling/pain: Septic arthritis versus gout.  He is hemodynamically stable.  Right wrist joint aspiration was performed in the ED.  Fluid was sent for culture and  Gram stain but not for crystals.  Patient reports improvement in swelling/pain after receiving antibiotics, steroids, NSAIDs. - Follow body fluid culture and Gram stain - Add-on synovial cell count with crystals; discussed with lab, they will try if enough fluid available - IV Toradol  15 mg every 6 hours scheduled for now - Hold off on additional steroids pending fluid culture - Continue empiric IV vancomycin  and ceftriaxone  for now  Hypertension: Continue amlodipine  and Toprol -XL.  Hold lisinopril , consider change to losartan given history of gout.  Hyperlipidemia: Continue atorvastatin .  HIV: Continue Odefsey .  Alcohol use: Patient reports occasional alcohol but did have recent heavy use.  Patient advised on alcohol is association with gout flares.  Tobacco use: Patient reports smoking 0.5 PPD.  Nicotine  patch provided.   DVT prophylaxis: enoxaparin  (LOVENOX ) injection 40 mg Start: 06/05/24 2200 Code Status: Full code  Family Communication: Discussed with patient, he will notify family Disposition Plan: From  home and likely discharge to home pending clinical progress Consults called: None Severity of Illness: The appropriate patient status for this patient is INPATIENT. Inpatient status is judged to be reasonable and necessary in order to provide the required intensity of service to ensure the patient's safety. The patient's presenting symptoms, physical exam findings, and initial radiographic and laboratory data in the context of their chronic comorbidities is felt to place them at high risk for further clinical deterioration. Furthermore, it is not anticipated that the patient will be medically stable for discharge from the hospital within 2 midnights of admission.   * I certify that at the point of admission it is my clinical judgment that the patient will require inpatient hospital care spanning beyond 2 midnights from the point of admission due to high intensity of service, high  risk for further deterioration and high frequency of surveillance required.DEWAINE Jorie Blanch MD [facility]  If 7PM-7AM, please contact night-coverage [email or web address]  06/05/2024, 12:52 AM

## 2024-06-05 NOTE — Plan of Care (Signed)

## 2024-06-05 NOTE — Progress Notes (Signed)
 Pharmacy Antibiotic Note  Brandon Quinn is a 58 y.o. male admitted on 06/04/2024 with concern for septic arthritis.  Pharmacy has been consulted for vancomycin  dosing.  Plan: Rec'd vanc 1750mg  in ED. Vancomycin  750mg  IV Q12H. Goal AUC 400-550.  Expected AUC 470.  Height: 5' 6 (167.6 cm) Weight: 87.1 kg (192 lb) IBW/kg (Calculated) : 63.8  Temp (24hrs), Avg:98.5 F (36.9 C), Min:98.3 F (36.8 C), Max:98.6 F (37 C)  Recent Labs  Lab 06/04/24 1844 06/05/24 0018  WBC 9.0  --   CREATININE 0.78  --   LATICACIDVEN 1.3 0.9    Estimated Creatinine Clearance: 101.5 mL/min (by C-G formula based on SCr of 0.78 mg/dL).    No Known Allergies   Thank you for allowing pharmacy to be a part of this patient's care.  Marvetta Dauphin, PharmD, BCPS  06/05/2024 1:11 AM

## 2024-06-06 DIAGNOSIS — B20 Human immunodeficiency virus [HIV] disease: Secondary | ICD-10-CM

## 2024-06-06 DIAGNOSIS — I10 Essential (primary) hypertension: Secondary | ICD-10-CM

## 2024-06-06 DIAGNOSIS — M109 Gout, unspecified: Secondary | ICD-10-CM

## 2024-06-06 DIAGNOSIS — E782 Mixed hyperlipidemia: Secondary | ICD-10-CM

## 2024-06-06 MED ORDER — CEFUROXIME AXETIL 500 MG PO TABS: 500.0000 mg | ORAL_TABLET | Freq: Two times a day (BID) | ORAL | 0 refills | Status: AC

## 2024-06-06 MED ORDER — COLCHICINE 0.6 MG PO TABS: 0.6000 mg | ORAL_TABLET | Freq: Every day | ORAL | 0 refills | Status: DC

## 2024-06-06 MED ORDER — HYDROCODONE-ACETAMINOPHEN 5-325 MG PO TABS: 1.0000 | ORAL_TABLET | Freq: Four times a day (QID) | ORAL | 0 refills | Status: AC | PRN

## 2024-06-06 MED ORDER — PREDNISONE 20 MG PO TABS: 20.0000 mg | ORAL_TABLET | Freq: Every day | ORAL | 0 refills | Status: AC

## 2024-06-06 NOTE — Progress Notes (Signed)
 Patient seen and examined.  Wrist pain significantly improved with nearly resolved swelling consistent with gout.  He can follow up with his primary care provider for chronic gout management.   Bebe Galla, M.D. [facility]

## 2024-06-06 NOTE — Discharge Summary (Signed)
 Physician Discharge Summary   Patient: Brandon Quinn MRN: [id number] DOB: August 29, 1966  Admit date:     06/04/2024  Discharge date: {dischdate:26783}  Discharge Physician: Concepcion Riser   PCP: Leonarda Roxan BROCKS, NP   Recommendations at discharge:  {Tip this will not be part of the note when signed- Example include specific recommendations for outpatient follow-up, pending tests to follow-up on. (Optional):26781}  ***  Discharge Diagnoses: Principal Problem:   Acute pain of right wrist Active Problems:   Essential hypertension   HIV disease (HCC)   Hyperlipidemia  Resolved Problems:   * No resolved hospital problems. *  Hospital Course: Brandon Quinn is a 58 y.o. male with medical history significant for HIV, HTN, HLD, gout, alcohol and tobacco use who is admitted for acute right wrist pain/swelling, septic arthritis versus gout.  Assessment and Plan: No notes have been filed under this hospital service. Service: Hospitalist     {Tip this will not be part of the note when signed Body mass index is 30.99 kg/m. , ,  (Optional):26781}  {(NOTE) Pain control PDMP Statment (Optional):26782} Consultants: *** Procedures performed: ***  Disposition: {Plan; Disposition:26390} Diet recommendation:  Discharge Diet Orders (From admission, onward)     Start     Ordered   06/06/24 0000  Diet - low sodium heart healthy        06/06/24 1110           {Diet_Plan:26776} DISCHARGE MEDICATION: Allergies as of 06/06/2024   No Known Allergies      Medication List     TAKE these medications    amLODipine  5 MG tablet Commonly known as: NORVASC  Take 5 mg by mouth daily. What changed: Another medication with the same name was removed. Continue taking this medication, and follow the directions you see here.   cefUROXime  500 MG tablet Commonly known as: CEFTIN  Take 1 tablet (500 mg total) by mouth 2 (two) times daily with a meal for 5 days.   colchicine  0.6 MG  tablet Take 1 tablet (0.6 mg total) by mouth daily for 10 days.   HYDROcodone -acetaminophen  5-325 MG tablet Commonly known as: NORCO/VICODIN Take 1-2 tablets by mouth every 6 (six) hours as needed for up to 3 days for moderate pain (pain score 4-6).   lisinopril  10 MG tablet Commonly known as: ZESTRIL  TAKE 1 TABLET(10 MG) BY MOUTH DAILY   metoprolol  succinate 25 MG 24 hr tablet Commonly known as: TOPROL -XL TAKE 1 TABLET(25 MG) BY MOUTH DAILY   nicotine  21 mg/24hr patch Commonly known as: NICODERM CQ  - dosed in mg/24 hours Place 1 patch (21 mg total) onto the skin daily.   Odefsey  200-25-25 MG Tabs tablet Generic drug: emtricitabine -rilpivir-tenofovir  AF Take 1 tablet by mouth daily.   predniSONE  20 MG tablet Commonly known as: DELTASONE  Take 1 tablet (20 mg total) by mouth daily with breakfast for 5 days.        Discharge Exam: Filed Weights   06/04/24 1719  Weight: 87.1 kg   ***  Condition at discharge: {DC Condition:26389}  The results of significant diagnostics from this hospitalization (including imaging, microbiology, ancillary and laboratory) are listed below for reference.   Imaging Studies: DG Wrist Complete Right Result Date: 06/04/2024 CLINICAL DATA:  Right wrist edema EXAM: RIGHT WRIST - COMPLETE 3+ VIEW COMPARISON:  May 22, 2016, December 22, 2012 FINDINGS: Volar screw and plate fixation of the distal radius. No hardware fracture or findings of loosening.No acute fracture or dislocation. There is no evidence of arthropathy or  other focal bone abnormality. Soft tissues are unremarkable. IMPRESSION: No acute fracture or dislocation. Electronically Signed   By: Rogelia Myers M.D.   On: 06/04/2024 18:07    Microbiology: Results for orders placed or performed during the hospital encounter of 06/04/24  Blood culture (routine x 2)     Status: None (Preliminary result)   Collection Time: 06/04/24  6:44 PM   Specimen: BLOOD  Result Value Ref Range Status    Specimen Description   Final    BLOOD LEFT ANTECUBITAL Performed at [facility], [address] [place], [place] [address]    Special Requests   Final    BOTTLES DRAWN AEROBIC AND ANAEROBIC Blood Culture adequate volume Performed at [facility], [address] [place], [place] [address]    Culture   Final    NO GROWTH 2 DAYS Performed at [facility], [address] [place], [place] [address]    Report Status PENDING  Incomplete  Blood culture (routine x 2)     Status: None (Preliminary result)   Collection Time: 06/04/24  7:21 PM   Specimen: BLOOD  Result Value Ref Range Status   Specimen Description   Final    BLOOD RIGHT ANTECUBITAL Performed at [facility], [address] [place], [place] [address]    Special Requests   Final    BOTTLES DRAWN AEROBIC AND ANAEROBIC Blood Culture results may not be optimal due to an inadequate volume of blood received in culture bottles Performed at [facility], [address] [place], [place] [address]    Culture   Final    NO GROWTH 2 DAYS Performed at [facility], [address], [place], [place] [address]    Report Status PENDING  Incomplete  Body fluid culture w Gram Stain     Status: None (Preliminary result)   Collection Time: 06/04/24  8:00 PM   Specimen: WRIST; Body Fluid  Result Value Ref Range Status   Specimen Description   Final    WRIST Performed at [facility], [address] [place], [place] [address]    Special Requests   Final    RIGHT Performed at [facility], [address] [place], [place] [address]    Gram Stain   Final    ABUNDANT WBC PRESENT, PREDOMINANTLY PMN NO ORGANISMS SEEN    Culture   Final    NO GROWTH < 12 HOURS Performed at [facility], [address] [place], [place] [address]    Report Status PENDING  Incomplete    Labs: CBC: Recent Labs  Lab 06/04/24 1844 06/05/24 0416  WBC 9.0 6.2   NEUTROABS 5.1  --   HGB 14.8 14.0  HCT 43.1 41.4  MCV 90.5 92.4  PLT 217 204   Basic Metabolic Panel: Recent Labs  Lab 06/04/24 1844 06/05/24 0416  NA 138 134*  K 3.3* 4.0  CL 100 101  CO2 26 22  GLUCOSE 93 177*  BUN 8 8  CREATININE 0.78 0.83  CALCIUM  9.2 8.7*   Liver Function Tests: Recent Labs  Lab 06/04/24 1844  AST 29  ALT 20  ALKPHOS 108  BILITOT 0.9  PROT 7.8  ALBUMIN 4.3   CBG: No results for input(s): GLUCAP in the last 168 hours.  Discharge time spent: {LESS THAN/GREATER UYJW:73611} 30 minutes.  Signed: Concepcion Riser, MD [facility] 06/06/2024

## 2024-06-06 NOTE — Plan of Care (Signed)
  Problem: Clinical Measurements: Goal: Will remain free from infection Outcome: Progressing   Problem: Pain Managment: Goal: General experience of comfort will improve and/or be controlled Outcome: Progressing

## 2024-06-06 NOTE — Progress Notes (Signed)
 Patient verbalized understanding of instructions including medications, side effects, blood pressure and heart rate monitoring at home. Patient also instructed to pick up meds from [facility] in [place]

## 2024-06-06 NOTE — Care Management (Signed)
 Patient provided with Good Rx coupons for medications

## 2024-06-08 LAB — BODY FLUID CULTURE W GRAM STAIN: Culture: NO GROWTH

## 2024-06-09 LAB — CULTURE, BLOOD (ROUTINE X 2)
Culture: NO GROWTH
Culture: NO GROWTH
Special Requests: ADEQUATE

## 2024-06-24 ENCOUNTER — Ambulatory Visit: Payer: Self-pay

## 2024-06-24 ENCOUNTER — Ambulatory Visit: Payer: Self-pay | Admitting: Internal Medicine

## 2024-07-08 ENCOUNTER — Other Ambulatory Visit: Payer: Self-pay | Admitting: Family

## 2024-07-08 DIAGNOSIS — I10 Essential (primary) hypertension: Secondary | ICD-10-CM

## 2024-07-08 MED ORDER — LISINOPRIL 10 MG PO TABS: 10.0000 mg | ORAL_TABLET | Freq: Every day | ORAL | 3 refills | Status: DC

## 2024-07-08 MED ORDER — AMLODIPINE BESYLATE 5 MG PO TABS: 5.0000 mg | ORAL_TABLET | Freq: Every day | ORAL | 1 refills | Status: DC

## 2024-07-08 MED ORDER — METOPROLOL SUCCINATE ER 25 MG PO TB24: 25.0000 mg | ORAL_TABLET | Freq: Every day | ORAL | 3 refills | Status: DC

## 2024-07-08 NOTE — Telephone Encounter (Unsigned)
 Copied from CRM #[id number]. Topic: Clinical - Medication Refill >> Jul 08, 2024 10:10 AM Merlynn A wrote: Medication: lisinopril  (ZESTRIL ) 10 MG tablet,  metoprolol  succinate (TOPROL -XL) 25 MG 24 hr tablet,  amLODipine  (NORVASC ) 5 MG tablet  Has the patient contacted their pharmacy? No (Agent: If no, request that the patient contact the pharmacy for the refill. If patient does not wish to contact the pharmacy document the reason why and proceed with request.) (Agent: If yes, when and what did the pharmacy advise?)  This is the patient's preferred pharmacy:  [id number] - [place], [place] - [address] AT [facility] [address] [place] [address] Phone: [phone] Fax: [phone]  Is this the correct pharmacy for this prescription? Yes If no, delete pharmacy and type the correct one.   Has the prescription been filled recently? No  Is the patient out of the medication? Yes  Has the patient been seen for an appointment in the last year OR does the patient have an upcoming appointment? Yes  Can we respond through MyChart? Yes  Agent: Please be advised that Rx refills may take up to 3 business days. We ask that you follow-up with your pharmacy.

## 2024-07-08 NOTE — Telephone Encounter (Signed)
 Medication has never been filled by pcp. Sent to pts pcp Ngetich, Dinah C, NP for review.

## 2024-07-20 ENCOUNTER — Ambulatory Visit: Payer: Self-pay | Admitting: Internal Medicine

## 2024-07-20 ENCOUNTER — Ambulatory Visit: Payer: Self-pay

## 2024-08-10 ENCOUNTER — Ambulatory Visit (INDEPENDENT_AMBULATORY_CARE_PROVIDER_SITE_OTHER): Payer: Self-pay | Admitting: Family

## 2024-08-10 ENCOUNTER — Encounter: Payer: Self-pay | Admitting: Family

## 2024-08-10 VITALS — BP 136/82 | HR 83 | Temp 97.9°F | Resp 20 | Ht 66.0 in | Wt 189.6 lb

## 2024-08-10 DIAGNOSIS — R7303 Prediabetes: Secondary | ICD-10-CM

## 2024-08-10 DIAGNOSIS — E782 Mixed hyperlipidemia: Secondary | ICD-10-CM

## 2024-08-10 DIAGNOSIS — I10 Essential (primary) hypertension: Secondary | ICD-10-CM

## 2024-08-10 DIAGNOSIS — K219 Gastro-esophageal reflux disease without esophagitis: Secondary | ICD-10-CM

## 2024-08-10 DIAGNOSIS — M109 Gout, unspecified: Secondary | ICD-10-CM

## 2024-08-10 DIAGNOSIS — Z720 Tobacco use: Secondary | ICD-10-CM

## 2024-08-10 MED ORDER — COLCHICINE 0.6 MG PO TABS: 0.6000 mg | ORAL_TABLET | Freq: Every day | ORAL | 3 refills | Status: DC

## 2024-08-10 MED ORDER — AMLODIPINE BESYLATE 5 MG PO TABS: 5.0000 mg | ORAL_TABLET | Freq: Every day | ORAL | 3 refills | Status: DC

## 2024-08-16 ENCOUNTER — Other Ambulatory Visit: Payer: Self-pay

## 2024-08-16 ENCOUNTER — Emergency Department (HOSPITAL_BASED_OUTPATIENT_CLINIC_OR_DEPARTMENT_OTHER)
Admission: EM | Admit: 2024-08-16 | Discharge: 2024-08-16 | Disposition: A | Discharge status: Home / Self Care | Payer: Self-pay | Attending: Emergency Medicine | Admitting: Emergency Medicine

## 2024-08-16 ENCOUNTER — Encounter (HOSPITAL_BASED_OUTPATIENT_CLINIC_OR_DEPARTMENT_OTHER): Payer: Self-pay

## 2024-08-16 DIAGNOSIS — E782 Mixed hyperlipidemia: Secondary | ICD-10-CM

## 2024-08-16 DIAGNOSIS — I10 Essential (primary) hypertension: Secondary | ICD-10-CM | POA: Insufficient documentation

## 2024-08-16 DIAGNOSIS — Z21 Asymptomatic human immunodeficiency virus [HIV] infection status: Secondary | ICD-10-CM | POA: Insufficient documentation

## 2024-08-16 DIAGNOSIS — T783XXA Angioneurotic edema, initial encounter: Secondary | ICD-10-CM | POA: Insufficient documentation

## 2024-08-16 DIAGNOSIS — Z79899 Other long term (current) drug therapy: Secondary | ICD-10-CM | POA: Insufficient documentation

## 2024-08-16 DIAGNOSIS — R7303 Prediabetes: Secondary | ICD-10-CM

## 2024-08-16 NOTE — ED Triage Notes (Signed)
 Reports swelling of R cheek and R side of bottom lip since this afternoon. Takes lisinopril . Denies known exposure to allergens. Denies difficulty breathing.

## 2024-08-16 NOTE — ED Provider Notes (Signed)
 Brandon Quinn Provider Note   CSN: [id number] Arrival date & time: 08/16/24  1737     Patient presents with: Facial Swelling   Brandon Quinn is a 58 y.o. male with history of HIV, hypertension, hyperlipidemia, presents with concern for right lower lip swelling that began at approximately 1:30 this afternoon (4 hours prior to arrival).  The lip swelling is only the right side of his lower lip.  He denies any injuries to this area.  Denies any recent dental work.  No new medications or foods.  No associated fever or chills.  No difficulties with swallowing or breathing.  He denies any shortness of breath.  He is on lisinopril  and has been on this medication for about 7 years.   HPI     Prior to Admission medications   Medication Sig Start Date End Date Taking? Authorizing Provider  amLODipine  (NORVASC ) 5 MG tablet Take 1 tablet (5 mg total) by mouth daily. 08/10/24   Ngetich, Dinah C, NP  colchicine  0.6 MG tablet Take 1 tablet (0.6 mg total) by mouth daily. 08/10/24   Ngetich, Dinah C, NP  emtricitabine -rilpivir-tenofovir  AF (ODEFSEY ) 200-25-25 MG TABS tablet Take 1 tablet by mouth daily. 02/04/24   Vu, Constance DASEN, MD  metoprolol  succinate (TOPROL -XL) 25 MG 24 hr tablet Take 1 tablet (25 mg total) by mouth daily. 07/08/24   Ngetich, Dinah C, NP    Allergies: Lisinopril     Review of Systems  HENT:         Lip swelling    Updated Vital Signs BP (!) 166/71   Pulse 86   Temp 98.1 F (36.7 C) (Oral)   Resp 16   SpO2 100%   Physical Exam Vitals and nursing note reviewed.  Constitutional:      Appearance: Normal appearance.  HENT:     Head: Atraumatic.     Mouth/Throat:     Comments: Edema of the right side of the lower lip.  No edema noted of the tongue or posterior oropharynx.  Patient swallowing without difficulty.  Patient without any signs of dental abscess or oral infection Neck:     Comments: Soft and supple Cardiovascular:      Rate and Rhythm: Normal rate and regular rhythm.  Pulmonary:     Effort: Pulmonary effort is normal.     Breath sounds: Normal breath sounds.     Comments: Lungs clear to auscultation bilaterally.  No stridor Talks in full sentences without difficulty Musculoskeletal:     Cervical back: Neck supple.  Neurological:     General: No focal deficit present.     Mental Status: He is alert.  Psychiatric:        Mood and Affect: Mood normal.        Behavior: Behavior normal.     (all labs ordered are listed, but only abnormal results are displayed) Labs Reviewed - No data to display  EKG: None  Radiology: No results found.   Procedures   Medications Ordered in the ED - No data to display                                  Medical Decision Making    Differential diagnosis includes but is not limited to allergic reaction, anaphylaxis, angioedema  ED Course:  Upon initial evaluation, patient is very well-appearing, no acute distress.  Normal vital signs aside from his  elevated blood pressure 166/71 upon arrival.  On exam, he has edema of the lower right side of his lip.  No edema noted elsewhere such other facial swelling, no edema of the tongue or posterior oropharynx.  Patient is swallowing without difficulty.  Talking in full sentences on room air without difficulty.  No stridor.  He denies any new medications, foods, no other rashes, no hypotension, low concern for anaphylactic reaction at this time.  No evidence of dental infection or abscess, no fevers, lower concern for infectious etiology. I had my attending Dr. Darra also personally evaluate patient.  We feel the swelling of his lip today is likely angioedema due to his lisinopril .  Patient has not had any worsening of his symptoms since the swelling began.  No airway involvement.  Swelling only localized to the lip.  Will have patient discontinue this medication immediately.  I did update the patient's medication list to  include lisinopril . My attending does not feel patient needs any observation in the ER due to his symptoms not changing or worsening in any way since they started. Patient stable and appropriate for discharge home   Impression: Angioedema, likely due to lisinopril   Disposition:  The patient was discharged home with instructions to discontinue taking the lisinopril  immediately.  He states he has a follow-up appointment with his PCP tomorrow.  Recommended that patient attend this PCP appointment for repeat evaluation of his lip swelling and to discuss further blood pressure management. Return precautions given and patient verbalized understanding.    This chart was dictated using voice recognition software, Dragon. Despite the best efforts of this provider to proofread and correct errors, errors may still occur which can change documentation meaning.       Final diagnoses:  Angioedema, initial encounter    ED Discharge Orders     None          Veta Palma, DEVONNA 08/16/24 1813    Long, Fonda MATSU, MD 08/16/24 1858

## 2024-08-16 NOTE — ED Notes (Signed)
 Discharge instructions reviewed with patient. Patient questions answered and opportunity for education reviewed. Patient voices understanding of discharge instructions with no further questions. Patient ambulatory with steady gait to lobby.

## 2024-08-16 NOTE — Discharge Instructions (Addendum)
 The lip swelling you are experiencing today is likely a reaction to one of your blood pressure medications, lisinopril .  Stop taking the lisinopril  immediately and do not take this medication anymore.  Please follow-up with your PCP at your appointment tomorrow to discuss further management of your blood pressures.  Please let them know that you were found to have angioedema today thought to be due to your lisinopril .  Please return to the ER for any increasing swelling, difficulties breathing or swallowing, any other new or concerning symptoms

## 2024-08-18 NOTE — Progress Notes (Signed)
 Provider: Roxan Plough FNP-C   Eriberto Felch, Roxan BROCKS, NP  Patient Care Team: Eyvette Cordon, Roxan BROCKS, NP as PCP - General (Family Medicine) Jordan, Peter M, MD as PCP - Cardiology (Cardiology)  Extended Emergency Contact Information Primary Emergency Contact: Salifou,Garba Address: [address]          [place], [place] [address] [place] Phone: [phone] Relation: Brother Secondary Emergency Contact: Kone,Aidriss  [place] Phone: [phone] Relation: Friend  Code Status: Full code Goals of care: Advanced Directive information    08/16/2024    5:48 PM  Advanced Directives  Does Patient Have a Medical Advance Directive? No     Chief Complaint  Patient presents with   Medical Management of Chronic Issues    3 Month follow up.Pt declined all vaccines.    Discussed the use of AI scribe software for clinical note transcription with the patient, who gave verbal consent to proceed.  History of Present Illness   Brandon Quinn is a 58 year old male with hypertension, gout, and HIV who presents for a three-month follow-up visit.  His blood pressure has been generally stable, remaining below 140 mmHg, although it was elevated at 152/68 mmHg upon arrival today, later decreasing to 136/82 mmHg. He is currently taking amlodipine , lisinopril  10 mg, and metoprolol  5 mg daily for hypertension. He has encountered issues with medication refills, preferring a 90-day supply, but his insurance only covers a 30-day supply.  He continues to smoke, a bit less than before, now at half a pack per day. He finds it difficult to quit smoking entirely due to stress. He is not currently using nicotine  replacement therapy.  Regarding his gout, he experiences occasional flare-ups, with recent discomfort in his left foot, which required the use of a walking stick. He has not been taking colchicine  due to cost concerns but needs it during flare-ups.  He has a  history of HIV and is under the care of Dr. Overton for this condition.  No issues with anxiety, depression, constipation, diarrhea, or urination. He sleeps well at night. He has not received recent vaccinations for flu, shingles, pneumonia, or tetanus. He completed a Cologuard test in March 2024, which returned negative results.  Recent lab work indicated a previously low potassium level, which has since normalized, but his glucose level was elevated at 177 mg/dL, possibly due to non-fasting status.   Past Medical History:  Diagnosis Date   Gout    HIV infection (HCC)    Hyperlipidemia    Hypertension    Substance abuse (HCC)    alcohol    Tobacco abuse    Past Surgical History:  Procedure Laterality Date   WRIST FRACTURE SURGERY  ~ 2009   right; put a plate in it (89/75/7986)    Allergies  Allergen Reactions   Lisinopril  Swelling    Developed angioedema of the right lower lip    Allergies as of 08/10/2024   No Known Allergies      Medication List        Accurate as of August 10, 2024 11:59 PM. If you have any questions, ask your nurse or doctor.          STOP taking these medications    nicotine  21 mg/24hr patch Commonly known as: NICODERM CQ  - dosed in mg/24 hours Stopped by: Daijanae Rafalski C Wilgus Deyton       TAKE these medications    amLODipine  5 MG tablet Commonly known as:  NORVASC  Take 1 tablet (5 mg total) by mouth daily.   colchicine  0.6 MG tablet Take 1 tablet (0.6 mg total) by mouth daily.   lisinopril  10 MG tablet Commonly known as: ZESTRIL  Take 1 tablet (10 mg total) by mouth daily.   metoprolol  succinate 25 MG 24 hr tablet Commonly known as: TOPROL -XL Take 1 tablet (25 mg total) by mouth daily.   Odefsey  200-25-25 MG Tabs tablet Generic drug: emtricitabine -rilpivir-tenofovir  AF Take 1 tablet by mouth daily.        Review of Systems  Constitutional:  Negative for appetite change, chills, fatigue, fever and unexpected weight change.  HENT:   Negative for congestion, dental problem, ear discharge, ear pain, facial swelling, hearing loss, nosebleeds, postnasal drip, rhinorrhea, sinus pressure, sinus pain, sneezing, sore throat, tinnitus and trouble swallowing.   Eyes:  Negative for pain, discharge, redness, itching and visual disturbance.  Respiratory:  Negative for cough, chest tightness, shortness of breath and wheezing.   Cardiovascular:  Negative for chest pain, palpitations and leg swelling.  Gastrointestinal:  Negative for abdominal distention, abdominal pain, blood in stool, constipation, diarrhea, nausea and vomiting.  Endocrine: Negative for cold intolerance, heat intolerance, polydipsia, polyphagia and polyuria.  Genitourinary:  Negative for difficulty urinating, dysuria, flank pain, frequency and urgency.  Musculoskeletal:  Negative for arthralgias, back pain, gait problem, joint swelling, myalgias, neck pain and neck stiffness.       Recent gout flare but has improved.  Skin:  Negative for color change, pallor, rash and wound.  Neurological:  Negative for dizziness, syncope, speech difficulty, weakness, light-headedness, numbness and headaches.  Hematological:  Does not bruise/bleed easily.  Psychiatric/Behavioral:  Negative for agitation, behavioral problems, confusion, hallucinations, self-injury, sleep disturbance and suicidal ideas. The patient is not nervous/anxious.     Immunization History  Administered Date(s) Administered   Influenza,inj,Quad PF,6+ Mos 06/27/2015, 07/29/2016, 07/20/2019   Influenza-Unspecified 06/27/2015, 07/29/2016, 08/11/2020   PPD Test 04/25/2015   Pneumococcal Polysaccharide-23 04/25/2015   Pneumococcal-Unspecified 04/25/2015   Pertinent  Health Maintenance Due  Topic Date Due   Influenza Vaccine  02/15/2025 (Originally 05/07/2024)   Colonoscopy  12/29/2032      04/30/2023    9:00 AM 07/25/2023    9:27 AM 10/22/2023    9:41 AM 02/05/2024    3:40 PM 08/10/2024    3:41 PM  Fall Risk   Falls in the past year? 0 0 0 0 0  Was there an injury with Fall? 0 0 0 0 0  Fall Risk Category Calculator 0 0 0 0 0  Patient at Risk for Falls Due to No Fall Risks No Fall Risks No Fall Risks No Fall Risks No Fall Risks  Fall risk Follow up Falls evaluation completed Falls evaluation completed Falls evaluation completed Falls prevention discussed;Falls evaluation completed Falls evaluation completed   Functional Status Survey:    Vitals:   08/10/24 1542 08/10/24 1554  BP: (!) 152/68 136/82  Pulse: 83   Resp: 20   Temp: 97.9 F (36.6 C)   SpO2: 99%   Weight: 189 lb 9.6 oz (86 kg)   Height: 5' 6 (1.676 m)    Body mass index is 30.6 kg/m. Physical Exam  VITALS: T- 97.9, P- 83, BP- 152/68, SaO2- 99% MEASUREMENTS: Weight- 189.6. GENERAL: Alert, cooperative, well developed, no acute distress. HEENT: Normocephalic, normal oropharynx, moist mucous membranes, ears and nose normal, no sinus tenderness. CHEST: Clear to auscultation bilaterally, no wheezes, rhonchi, or crackles, no back tenderness. CARDIOVASCULAR: Normal heart rate and rhythm,  S1 and S2 normal without murmurs. ABDOMEN: Soft, non-tender, non-distended, without organomegaly, normal bowel sounds. EXTREMITIES: No cyanosis, edema, swelling, or redness. NEUROLOGICAL: Cranial nerves grossly intact, moves all extremities without gross motor or sensory deficit.  SKIN: No rash,no lesion or erythema   PSYCHIATRY/BEHAVIORAL: Mood stable    Labs reviewed: Recent Labs    02/06/24 0631 06/04/24 1844 06/05/24 0416  NA 138 138 134*  K 4.0 3.3* 4.0  CL 101 100 101  CO2 22 26 22   GLUCOSE 137* 93 177*  BUN 8 8 8   CREATININE 0.93 0.78 0.83  CALCIUM  9.4 9.2 8.7*   Recent Labs    01/08/24 1637 02/06/24 0631 06/04/24 1844  AST 23 33 29  ALT 14 22 20   ALKPHOS 94 119 108  BILITOT 0.6 0.7 0.9  PROT 8.2* 8.0 7.8  ALBUMIN 3.7 4.2 4.3   Recent Labs    01/08/24 1637 02/06/24 0631 06/04/24 1844 06/05/24 0416  WBC 11.3*  9.4 9.0 6.2  NEUTROABS 6.2 4.3 5.1  --   HGB 14.2 14.5 14.8 14.0  HCT 42.0 42.1 43.1 41.4  MCV 89.9 89.8 90.5 92.4  PLT 274 212 217 204   Lab Results  Component Value Date   TSH 1.23 10/30/2022   Lab Results  Component Value Date   HGBA1C 5.8 (H) 10/30/2022   Lab Results  Component Value Date   CHOL 219 (H) 10/30/2022   HDL 66 10/30/2022   LDLCALC 136 (H) 10/30/2022   TRIG 71 10/30/2022   CHOLHDL 3.3 10/30/2022    Significant Diagnostic Results in last 30 days:  No results found.  Assessment/Plan Assessment and Plan    Essential hypertension Blood pressure initially elevated at 152/68 mmHg, improved to 136/82 mmHg. Current medications include lisinopril , amlodipine , and metoprolol . Issues with medication supply due to insurance limitations. - Continue lisinopril , amlodipine , and metoprolol . - Reordered amlodipine  for a 90-day supply. - Advised on medication refills through MyChart or pharmacy calls.  Gout, left foot Recent gout flare in the left foot, currently improved. Previous prescription for colchicine  not filled due to cost concerns. - Prescribed colchicine  with instructions for acute gout management: 2 pills on the first day, then 1 pill daily until resolution. - Ensured prescription refills are available for future flares.  Mixed hyperlipidemia Cholesterol levels not recently checked. - Ordered cholesterol panel.  Prediabetes Glucose level of 177 mg/dL, possibly non-fasting. Requires further evaluation to confirm prediabetes status. - Ordered fasting glucose test. - Scheduled lab work for next Monday.  Tobacco use Continues to smoke half a pack per day. Advised on the risks of smoking, including vascular thickening and cholesterol deposition. - Encouraged reduction in smoking and eventual cessation. - Advised on the benefits of exercise.  General Health Maintenance No recent flu, shingles, pneumonia, or tetanus vaccinations. Recent Cologuard test was  negative. - Recommended flu, shingles, pneumonia, and tetanus vaccinations.   Family/ staff Communication: Reviewed plan of care with patient verbalized understanding  Labs/tests ordered: - TSH; Future - Lipid panel; Future - Hemoglobin A1c; Future   Next Appointment : Return in about 4 months (around 12/08/2024) for medical mangement of chronic issues., fasting labs in one week.   Spent 30 minutes of Face to face and non-face to face with patient  >50% time spent counseling; reviewing medical record; tests; labs; documentation and developing future plan of care.   Roxan JAYSON Plough, NP

## 2024-09-03 ENCOUNTER — Other Ambulatory Visit: Payer: Self-pay | Admitting: Nurse Practitioner

## 2024-10-04 ENCOUNTER — Other Ambulatory Visit: Payer: Self-pay | Admitting: Family

## 2024-10-04 DIAGNOSIS — M109 Gout, unspecified: Secondary | ICD-10-CM

## 2024-10-04 DIAGNOSIS — I10 Essential (primary) hypertension: Secondary | ICD-10-CM

## 2024-10-04 MED ORDER — METOPROLOL SUCCINATE ER 25 MG PO TB24: 25.0000 mg | ORAL_TABLET | Freq: Every day | ORAL | 5 refills | Status: AC

## 2024-10-04 MED ORDER — AMLODIPINE BESYLATE 5 MG PO TABS: 5.0000 mg | ORAL_TABLET | Freq: Every day | ORAL | 5 refills | Status: DC

## 2024-10-04 MED ORDER — COLCHICINE 0.6 MG PO TABS: 0.6000 mg | ORAL_TABLET | Freq: Every day | ORAL | 5 refills | Status: AC

## 2024-10-04 NOTE — Telephone Encounter (Signed)
 Copied from CRM #[id number]. Topic: Clinical - Medication Refill >> Oct 04, 2024 12:34 PM DeAngela L wrote: Medication: metoprolol  succinate (TOPROL -XL) 25 MG 24 hr tablet  colchicine  0.6 MG tablet amLODipine  (NORVASC ) 5 MG tablet   Has the patient contacted their pharmacy? Yes, the pharmacy states the doctor office didn't send a refill  (Agent: If no, request that the patient contact the pharmacy for the refill. If patient does not wish to contact the pharmacy document the reason why and proceed with request.) (Agent: If yes, when and what did the pharmacy advise?)  This is the patient's preferred pharmacy:  [id number] - [place], [place] - [address] AT [facility] [address] [place] [address] Phone: [phone] Fax: [phone]  Is this the correct pharmacy for this prescription? Yes  If no, delete pharmacy and type the correct one.   Has the prescription been filled recently? Yes   Is the patient out of the medication? Yes   Has the patient been seen for an appointment in the last year OR does the patient have an upcoming appointment? Yes   Can we respond through MyChart? No   Agent: Please be advised that Rx refills may take up to 3 business days. We ask that you follow-up with your pharmacy.

## 2024-11-02 NOTE — Telephone Encounter (Signed)
 Copied from CRM #[id number]. Topic: Clinical - Medication Refill >> Nov 02, 2024  9:55 AM Diannia H wrote: Medication: amLODipine  (NORVASC ) 5 MG tablet, lisinopril  (ZESTRIL ) tablet 10 mg, Metoprolol  Succinate 25 mg  Has the patient contacted their pharmacy? Yes (Agent: If no, request that the patient contact the pharmacy for the refill. If patient does not wish to contact the pharmacy document the reason why and proceed with request.) (Agent: If yes, when and what did the pharmacy advise?)  This is the patient's preferred pharmacy:  [id number] - [place], [place] - [address] AT [facility] [address]  [place] [address] Phone: [phone] Fax: [phone]  Is this the correct pharmacy for this prescription? Yes If no, delete pharmacy and type the correct one.   Has the prescription been filled recently? Yes  Is the patient out of the medication? Yes  Has the patient been seen for an appointment in the last year OR does the patient have an upcoming appointment? Yes  Can we respond through MyChart? Yes  Agent: Please be advised that Rx refills may take up to 3 business days. We ask that you follow-up with your pharmacy.

## 2024-11-02 NOTE — Telephone Encounter (Addendum)
 Patient is only on amlodipine  and metoprolol  according to active medication list. Both medications were filled on 10/04/2024 with 5 additional refills and refills should be on file at the pharmacy. Lisinopril  was stopped in the ED on 08/16/24 visit  Outgoing call placed to the pharmacy, after a 20 minute hold, spoke with Tiffany and she seen that refills were on file, however she works for the mail [employer] ( I called the local and not sure how I connected with the mail order department). Tiffany transferred me back to the local store, spoke with male pharmacist and he assured me that refills were on file.   Patient is aware of the above and asked that I call pharmacy back to tell them to get refills ready for pick up and I advised that it would be later today, as I had already dedicated over 20 minutes of trying to get this resolved.

## 2024-11-02 NOTE — Telephone Encounter (Addendum)
 I called patient and informed him that the responsibility to connect with the pharmacy to request that medications be prepared for pick up now rest on him and I was unable to connect with someone at the local pharmacy

## 2024-11-02 NOTE — Telephone Encounter (Addendum)
 I called the pharmacy x 2 and both times was transferred to a customer service representative that explained that the pharmacy had an emergency and is closed, they had no way to connect me to a voicemail.

## 2024-11-06 ENCOUNTER — Encounter (HOSPITAL_BASED_OUTPATIENT_CLINIC_OR_DEPARTMENT_OTHER): Payer: Self-pay

## 2024-11-06 ENCOUNTER — Emergency Department (HOSPITAL_BASED_OUTPATIENT_CLINIC_OR_DEPARTMENT_OTHER)
Admission: EM | Admit: 2024-11-06 | Discharge: 2024-11-06 | Disposition: A | Discharge status: Home / Self Care | Payer: Self-pay | Attending: Emergency Medicine | Admitting: Emergency Medicine

## 2024-11-06 ENCOUNTER — Other Ambulatory Visit: Payer: Self-pay

## 2024-11-06 DIAGNOSIS — Z21 Asymptomatic human immunodeficiency virus [HIV] infection status: Secondary | ICD-10-CM | POA: Insufficient documentation

## 2024-11-06 DIAGNOSIS — I10 Essential (primary) hypertension: Secondary | ICD-10-CM | POA: Insufficient documentation

## 2024-11-06 DIAGNOSIS — Z79899 Other long term (current) drug therapy: Secondary | ICD-10-CM | POA: Insufficient documentation

## 2024-11-06 DIAGNOSIS — Z760 Encounter for issue of repeat prescription: Secondary | ICD-10-CM | POA: Insufficient documentation

## 2024-11-06 DIAGNOSIS — M109 Gout, unspecified: Secondary | ICD-10-CM | POA: Insufficient documentation

## 2024-11-06 MED ORDER — AMLODIPINE BESYLATE 5 MG PO TABS: 5.0000 mg | ORAL_TABLET | Freq: Every day | ORAL | 2 refills | Status: AC

## 2024-11-06 MED ORDER — METHYLPREDNISOLONE SODIUM SUCC 125 MG IJ SOLR
125.0000 mg | Freq: Once | INTRAMUSCULAR | Status: DC
  Filled 2024-11-06: qty 2

## 2024-11-06 MED ORDER — AMLODIPINE BESYLATE 5 MG PO TABS
10.0000 mg | ORAL_TABLET | Freq: Once | ORAL | Status: AC
  Administered 2024-11-06: 10 mg via ORAL
  Filled 2024-11-06: qty 2

## 2024-11-06 MED ORDER — METHYLPREDNISOLONE SODIUM SUCC 125 MG IJ SOLR
125.0000 mg | Freq: Once | INTRAMUSCULAR | Status: AC
  Administered 2024-11-06: 125 mg via INTRAVENOUS

## 2024-11-06 MED ORDER — COLCHICINE 0.6 MG PO TABS
1.2000 mg | ORAL_TABLET | Freq: Once | ORAL | Status: AC
  Administered 2024-11-06: 1.2 mg via ORAL
  Filled 2024-11-06: qty 2

## 2024-11-06 MED ORDER — PREDNISONE 10 MG (21) PO TBPK: ORAL_TABLET | Freq: Every day | ORAL | 0 refills | Status: AC

## 2024-11-06 MED ORDER — HYDROCODONE-ACETAMINOPHEN 5-325 MG PO TABS
1.0000 | ORAL_TABLET | Freq: Once | ORAL | Status: AC
  Administered 2024-11-06: 1 via ORAL
  Filled 2024-11-06: qty 1

## 2024-11-06 NOTE — Discharge Instructions (Addendum)
 I have refilled your prescription for amlodipine .  I have also sent in prednisone  for you.  Return for any concerning symptoms.  Follow-up with your primary care doctor.  If you develop any chest pain, shortness of breath, vision change, or balance issues return for evaluation.

## 2024-11-06 NOTE — ED Provider Notes (Signed)
 " [place] [facility] Provider Note   CSN: [id number] Arrival date & time: 11/06/24  1221     Patient presents with: Hypertension and Gout   Brandon Quinn is a 59 y.o. male.   59 year old male with past medical history significant for hypertension, gout presents today for concern of needing a refill on amlodipine  which he ran out of 2 days ago.  At the same time he had left wrist pain that developed.  He states this pain is consistent with his previous gout attacks which have also occurred in the exact same wrist.  No fever.  Has not tried anything for pain prior to arrival.  He denies any chest pain, shortness of breath, balance issues, vision change.  The history is provided by the patient. No language interpreter was used.       Prior to Admission medications  Medication Sig Start Date End Date Taking? Authorizing Provider  predniSONE  (STERAPRED UNI-PAK 21 TAB) 10 MG (21) TBPK tablet Take by mouth daily. Take 6 tabs by mouth daily  for 2 days, then 5 tabs for 2 days, then 4 tabs for 2 days, then 3 tabs for 2 days, 2 tabs for 2 days, then 1 tab by mouth daily for 2 days 11/06/24  Yes Hildegard, Adisa Vigeant, PA-C  amLODipine  (NORVASC ) 5 MG tablet Take 1 tablet (5 mg total) by mouth daily. 11/06/24   Hildegard Loge, PA-C  colchicine  0.6 MG tablet Take 1 tablet (0.6 mg total) by mouth daily. 10/04/24   Ngetich, Dinah C, NP  emtricitabine -rilpivir-tenofovir  AF (ODEFSEY ) 200-25-25 MG TABS tablet Take 1 tablet by mouth daily. 02/04/24   Vu, Constance DASEN, MD  metoprolol  succinate (TOPROL -XL) 25 MG 24 hr tablet Take 1 tablet (25 mg total) by mouth daily. 10/04/24   Ngetich, Dinah C, NP    Allergies: Lisinopril     Review of Systems  Constitutional:  Negative for chills and fever.  Eyes:  Negative for visual disturbance.  Respiratory:  Negative for shortness of breath.   Cardiovascular:  Negative for chest pain.  Musculoskeletal:  Positive for arthralgias and joint swelling.   Skin:  Negative for wound.  Neurological:  Negative for light-headedness.  All other systems reviewed and are negative.   Updated Vital Signs BP (!) 184/78   Pulse (!) 102   Temp 98.5 F (36.9 C)   Resp 18   Ht 5' 11 (1.803 m)   Wt 86.2 kg   SpO2 99%   BMI 26.50 kg/m   Physical Exam Vitals and nursing note reviewed.  Constitutional:      General: He is not in acute distress.    Appearance: Normal appearance. He is not ill-appearing.  HENT:     Head: Normocephalic and atraumatic.     Nose: Nose normal.  Eyes:     Conjunctiva/sclera: Conjunctivae normal.  Cardiovascular:     Rate and Rhythm: Normal rate.  Pulmonary:     Effort: Pulmonary effort is normal. No respiratory distress.     Breath sounds: Normal breath sounds. No wheezing.  Musculoskeletal:        General: No deformity.     Comments: Swelling, tenderness to the left wrist.  Patient is right-hand dominant.  Neurovascularly intact.  No deformity.  Good range of motion in all of the digits in the left hand.  Skin:    Findings: No rash.  Neurological:     Mental Status: He is alert.     (all labs ordered  are listed, but only abnormal results are displayed) Labs Reviewed - No data to display  EKG: None  Radiology: No results found.   Procedures   Medications Ordered in the ED  colchicine  tablet 1.2 mg (1.2 mg Oral Given 11/06/24 1345)  amLODipine  (NORVASC ) tablet 10 mg (10 mg Oral Given 11/06/24 1345)  methylPREDNISolone  sodium succinate (SOLU-MEDROL ) 125 mg/2 mL injection 125 mg (125 mg Intravenous Given 11/06/24 1348)  HYDROcodone -acetaminophen  (NORCO/VICODIN) 5-325 MG per tablet 1 tablet (1 tablet Oral Given 11/06/24 1409)                                    Medical Decision Making Risk Prescription drug management.   Medical Decision Making / ED Course   This patient presents to the ED for concern of left breast pain, medication refill, this involves an extensive number of treatment  options, and is a complaint that carries with it a high risk of complications and morbidity.  The differential diagnosis includes medication refill, septic arthritis, gout, pseudogout, fracture  MDM: 59 year old male presents today for concern of needing a refill on his amlodipine , and gout attack to his left wrist.  He has history of gout and has had this in the same joint before.  Exam is consistent with gout.  He is afebrile.  Denies any injury to his left wrist and no open wounds on exam.  We discussed concern for septic arthritis and strict return precautions.  Will give him dose of Solu-Medrol  and give him a dose of colchicine . Dose of amlodipine  given.  Refill for amlodipine  given. No signs or symptoms of endorgan damage.  He feels that pain is the reason that his blood pressure is elevated. Strict return precautions discussed including vision change, balance issues, chest pain, shortness of breath.  Currently he is without any anginal symptoms. Patient discharged in stable condition.  Return precaution discussed.  Patient voices understanding and is in agreement with plan.  Lab Tests: -I ordered, reviewed, and interpreted labs.   The pertinent results include:   Labs Reviewed - No data to display    EKG  EKG Interpretation Date/Time:    Ventricular Rate:    PR Interval:    QRS Duration:    QT Interval:    QTC Calculation:   R Axis:      Text Interpretation:           Imaging Studies ordered: I ordered imaging studies including none.  Imaging considered however this is consistent with his previous episodes of gout.  Denies any new injury. I independently visualized and interpreted imaging. I agree with the radiologist interpretation   Medicines ordered and prescription drug management: Meds ordered this encounter  Medications   colchicine  tablet 1.2 mg   DISCONTD: methylPREDNISolone  sodium succinate (SOLU-MEDROL ) 125 mg/2 mL injection 125 mg    IV methylprednisolone   will be converted to either a q12h or q24h frequency with the same total daily dose (TDD).  Ordered Dose: 1 to 125 mg TDD; convert to: TDD q24h.  Ordered Dose: 126 to 250 mg TDD; convert to: TDD div q12h.  Ordered Dose: >250 mg TDD; DAW.   amLODipine  (NORVASC ) tablet 10 mg   methylPREDNISolone  sodium succinate (SOLU-MEDROL ) 125 mg/2 mL injection 125 mg    IV methylprednisolone  will be converted to either a q12h or q24h frequency with the same total daily dose (TDD).  Ordered Dose: 1 to 125 mg  TDD; convert to: TDD q24h.  Ordered Dose: 126 to 250 mg TDD; convert to: TDD div q12h.  Ordered Dose: >250 mg TDD; DAW.   HYDROcodone -acetaminophen  (NORCO/VICODIN) 5-325 MG per tablet 1 tablet    Refill:  0   predniSONE  (STERAPRED UNI-PAK 21 TAB) 10 MG (21) TBPK tablet    Sig: Take by mouth daily. Take 6 tabs by mouth daily  for 2 days, then 5 tabs for 2 days, then 4 tabs for 2 days, then 3 tabs for 2 days, 2 tabs for 2 days, then 1 tab by mouth daily for 2 days    Dispense:  42 tablet    Refill:  0    Supervising Provider:   CLEOTILDE ROGUE [[id number]]   amLODipine  (NORVASC ) 5 MG tablet    Sig: Take 1 tablet (5 mg total) by mouth daily.    Dispense:  30 tablet    Refill:  2    Supervising Provider:   CLEOTILDE ROGUE [[id number]]    -I have reviewed the patients home medicines and have made adjustments as needed  Reevaluation: After the interventions noted above, I reevaluated the patient and found that they have :improved  Co morbidities that complicate the patient evaluation  Past Medical History:  Diagnosis Date   Gout    HIV infection (HCC)    Hyperlipidemia    Hypertension    Substance abuse (HCC)    alcohol    Tobacco abuse       Dispostion: Discharged in stable condition.  Return precaution discussed.  Patient voices understanding and is in agreement with the plan.  Final diagnoses:  Uncontrolled hypertension  Acute gout of left wrist, unspecified cause    ED Discharge Orders           Ordered    predniSONE  (STERAPRED UNI-PAK 21 TAB) 10 MG (21) TBPK tablet  Daily        11/06/24 1408    amLODipine  (NORVASC ) 5 MG tablet  Daily        11/06/24 1408               Hildegard Loge, PA-C 11/06/24 1503  "

## 2024-11-06 NOTE — ED Triage Notes (Addendum)
 Pt w/ hx HTN. On amlodipine  and metoprolol  at home. Reports having trouble w/ insurance company and unable to refill amlodipine  currently. Out of amlodipine  x2 days. Denies chest pain, palpitations, SHOB. Also reports hx gout and currently having L wrist pain/ tenderness, awaking him from sleep during the night.

## 2024-12-08 ENCOUNTER — Ambulatory Visit: Payer: Self-pay | Admitting: Family
